# Patient Record
Sex: Male | Born: 1960 | Race: White | Hispanic: No | Marital: Married | Smoking: Heavy tobacco smoker
Health system: Southern US, Community
[De-identification: ages and names within clinical notes are randomized; demographics above are authoritative.]

## PROBLEM LIST (undated history)

## (undated) DIAGNOSIS — I739 Peripheral vascular disease, unspecified: Secondary | ICD-10-CM

## (undated) DIAGNOSIS — Z87442 Personal history of urinary calculi: Secondary | ICD-10-CM

## (undated) DIAGNOSIS — I2510 Atherosclerotic heart disease of native coronary artery without angina pectoris: Secondary | ICD-10-CM

## (undated) DIAGNOSIS — N189 Chronic kidney disease, unspecified: Secondary | ICD-10-CM

## (undated) DIAGNOSIS — K219 Gastro-esophageal reflux disease without esophagitis: Secondary | ICD-10-CM

## (undated) DIAGNOSIS — J189 Pneumonia, unspecified organism: Secondary | ICD-10-CM

## (undated) DIAGNOSIS — E7800 Pure hypercholesterolemia, unspecified: Secondary | ICD-10-CM

## (undated) DIAGNOSIS — J449 Chronic obstructive pulmonary disease, unspecified: Secondary | ICD-10-CM

## (undated) DIAGNOSIS — F32A Depression, unspecified: Secondary | ICD-10-CM

## (undated) DIAGNOSIS — I714 Abdominal aortic aneurysm, without rupture, unspecified: Secondary | ICD-10-CM

## (undated) DIAGNOSIS — I10 Essential (primary) hypertension: Secondary | ICD-10-CM

## (undated) DIAGNOSIS — I723 Aneurysm of iliac artery: Secondary | ICD-10-CM

## (undated) DIAGNOSIS — F419 Anxiety disorder, unspecified: Secondary | ICD-10-CM

## (undated) DIAGNOSIS — C801 Malignant (primary) neoplasm, unspecified: Secondary | ICD-10-CM

## (undated) HISTORY — DX: Peripheral vascular disease, unspecified: I73.9

## (undated) HISTORY — DX: Abdominal aortic aneurysm, without rupture, unspecified: I71.40

## (undated) HISTORY — PX: APPENDECTOMY: SHX54

## (undated) HISTORY — DX: Pure hypercholesterolemia, unspecified: E78.00

## (undated) HISTORY — PX: CATARACT EXTRACTION: SUR2

## (undated) HISTORY — DX: Atherosclerotic heart disease of native coronary artery without angina pectoris: I25.10

---
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
## 2007-12-13 ENCOUNTER — Ambulatory Visit (HOSPITAL_COMMUNITY): Admission: RE | Admit: 2007-12-13 | Discharge: 2007-12-13 | Payer: Self-pay | Admitting: Ophthalmology

## 2009-12-03 ENCOUNTER — Ambulatory Visit (HOSPITAL_COMMUNITY): Admission: RE | Admit: 2009-12-03 | Discharge: 2009-12-03 | Payer: Self-pay | Admitting: Urology

## 2011-02-11 LAB — BASIC METABOLIC PANEL
BUN: 11
Chloride: 105
GFR calc Af Amer: >60

## 2011-02-11 LAB — HEMOGLOBIN AND HEMATOCRIT, BLOOD: Hemoglobin: 14.3

## 2013-07-18 ENCOUNTER — Ambulatory Visit (INDEPENDENT_AMBULATORY_CARE_PROVIDER_SITE_OTHER): Payer: BC Managed Care – PPO | Admitting: Family Medicine

## 2013-07-18 ENCOUNTER — Encounter: Payer: Self-pay | Admitting: Family Medicine

## 2013-07-18 ENCOUNTER — Encounter (INDEPENDENT_AMBULATORY_CARE_PROVIDER_SITE_OTHER): Payer: Self-pay

## 2013-07-18 ENCOUNTER — Ambulatory Visit (INDEPENDENT_AMBULATORY_CARE_PROVIDER_SITE_OTHER): Payer: BC Managed Care – PPO

## 2013-07-18 VITALS — BP 118/80 | HR 84 | Temp 98.3°F | Ht 72.0 in | Wt 313.8 lb

## 2013-07-18 DIAGNOSIS — R0989 Other specified symptoms and signs involving the circulatory and respiratory systems: Secondary | ICD-10-CM

## 2013-07-18 DIAGNOSIS — J209 Acute bronchitis, unspecified: Secondary | ICD-10-CM

## 2013-07-18 MED ORDER — ALBUTEROL SULFATE HFA 108 (90 BASE) MCG/ACT IN AERS: 2.0000 | INHALATION_SPRAY | Freq: Four times a day (QID) | RESPIRATORY_TRACT | Status: DC | PRN

## 2013-07-18 MED ORDER — ALBUTEROL SULFATE (2.5 MG/3ML) 0.083% IN NEBU: 2.5000 mg | INHALATION_SOLUTION | Freq: Four times a day (QID) | RESPIRATORY_TRACT | Status: DC | PRN

## 2013-07-18 MED ORDER — LEVALBUTEROL HCL 1.25 MG/0.5ML IN NEBU: 1.2500 mg | INHALATION_SOLUTION | Freq: Once | RESPIRATORY_TRACT | Status: DC

## 2013-07-18 MED ORDER — LEVOFLOXACIN 500 MG PO TABS: 500.0000 mg | ORAL_TABLET | Freq: Every day | ORAL | Status: DC

## 2013-07-18 MED ORDER — HYDROCODONE-HOMATROPINE 5-1.5 MG/5ML PO SYRP: 5.0000 mL | ORAL_SOLUTION | Freq: Three times a day (TID) | ORAL | Status: DC | PRN

## 2013-07-18 MED ORDER — PREDNISONE 10 MG PO TABS: ORAL_TABLET | ORAL | Status: DC

## 2013-07-18 NOTE — Progress Notes (Signed)
   Subjective:    Patient ID: Resa Minerarl A Kingsford, male    DOB: [birth year], 53 y.o.   MRN: [id number]  HPI  This 53 y.o. male presents for evaluation of cough and wheezing.  He is SOB with exertion And he is a smoker.  He was told by the nurse at work he may have pneumonia.  Review of Systems C/o cough and URI sx's   No chest pain, SOB, HA, dizziness, vision change, N/V, diarrhea, constipation, dysuria, urinary urgency or frequency, myalgias, arthralgias or rash.  Objective:   Physical Exam  Vital signs noted  Well developed well nourished male.  HEENT - Head atraumatic Normocephalic                Eyes - PERRLA, Conjuctiva - clear Sclera- Clear EOMI                Ears - EAC's Wnl TM's Wnl Gross Hearing WNL                Nose - Nares patent                 Throat - oropharanx wnl Respiratory - Lungs with expiratory wheezes scattered Cardiac - RRR S1 and S2 without murmur GI - Abdomen soft Nontender and bowel sounds active x 4  Post Neb tx - Lungs with better air movement and expiratory wheezes.  CXR - No infiltrates Prelimnary reading by Angeline SlimWilliam Lelah Rennaker,FNP    Assessment & Plan:  Chest congestion - Plan: DG Chest 2 View, levofloxacin (LEVAQUIN) 500 MG tablet, predniSONE (DELTASONE) 10 MG tablet, HYDROcodone-homatropine (HYCODAN) 5-1.5 MG/5ML syrup, albuterol (PROVENTIL HFA;VENTOLIN HFA) 108 (90 BASE) MCG/ACT inhaler, levalbuterol (XOPENEX) nebulizer solution 1.25 mg  Acute bronchitis - Plan: levofloxacin (LEVAQUIN) 500 MG tablet, predniSONE (DELTASONE) 10 MG tablet, HYDROcodone-homatropine (HYCODAN) 5-1.5 MG/5ML syrup, albuterol (PROVENTIL HFA;VENTOLIN HFA) 108 (90 BASE) MCG/ACT inhaler, levalbuterol (XOPENEX) nebulizer solution 1.25 mg  Push po fluids, rest, tylenol and motrin otc prn as directed for fever, arthralgias, and myalgias.  Follow up prn if sx's continue or persist.  Deatra CanterWilliam J Cillian Gwinner FNP

## 2014-09-19 ENCOUNTER — Encounter: Payer: Self-pay | Admitting: Physician Assistant

## 2014-09-19 ENCOUNTER — Ambulatory Visit: Payer: Self-pay | Admitting: Family

## 2014-09-19 ENCOUNTER — Ambulatory Visit (INDEPENDENT_AMBULATORY_CARE_PROVIDER_SITE_OTHER): Payer: BLUE CROSS/BLUE SHIELD | Admitting: Physician Assistant

## 2014-09-19 ENCOUNTER — Ambulatory Visit (INDEPENDENT_AMBULATORY_CARE_PROVIDER_SITE_OTHER): Payer: BLUE CROSS/BLUE SHIELD

## 2014-09-19 VITALS — BP 125/83 | HR 75 | Temp 96.8°F | Ht 72.0 in | Wt 315.0 lb

## 2014-09-19 DIAGNOSIS — M25532 Pain in left wrist: Secondary | ICD-10-CM | POA: Diagnosis not present

## 2014-09-19 DIAGNOSIS — M79642 Pain in left hand: Secondary | ICD-10-CM | POA: Diagnosis not present

## 2014-09-19 MED ORDER — DICLOFENAC SODIUM 75 MG PO TBEC: 75.0000 mg | DELAYED_RELEASE_TABLET | Freq: Two times a day (BID) | ORAL | Status: DC

## 2014-09-19 NOTE — Progress Notes (Signed)
   Subjective:    Patient ID: Vincent Griffin, male    DOB: 1961-04-07, 54 y.o.   MRN: [id number]  HPI  54 y/o male presents with c/o left hand pain and swelling that started yesterday. He had pain in his left bicep/tricep area 3 weeks ago also. 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 Initial symptom of tingling but has resolved.     Review of Systems  Musculoskeletal: Positive for joint swelling and arthralgias.  Skin: Negative.   Neurological: Negative.        Objective:   Physical Exam  Constitutional: He is oriented to person, place, and time. He appears well-developed and well-nourished. No distress.  Obese    Cardiovascular: Normal rate, regular rhythm and normal heart sounds.   Pulmonary/Chest: Effort normal and breath sounds normal.  Musculoskeletal: He exhibits edema (left wrist and dorsal surface of hand) and tenderness.  Xray negative for fracture or dislocation  Decreased ROM d/t pain   Neurological: He is alert and oriented to person, place, and time.  Skin: No rash noted. He is not diaphoretic. No erythema.  Psychiatric: He has a normal mood and affect. His behavior is normal. Judgment and thought content normal.  Nursing note and vitals reviewed.         Assessment & Plan:  1. Pain in joint involving hand, left  - DG Hand Complete Left; Future - diclofenac (VOLTAREN) 75 MG EC tablet; Take 1 tablet (75 mg total) by mouth 2 (two) times daily.  Dispense: 60 tablet; Refill: 1  2. Left wrist pain due to overuse   - diclofenac (VOLTAREN) 75 MG EC tablet; Take 1 tablet (75 mg total) by mouth 2 (two) times daily.  Dispense: 60 tablet; Refill: 1  RTC 3 weeks   Skylen Danielsen A. Chauncey ReadingGann PA-C

## 2014-09-19 NOTE — Patient Instructions (Addendum)
Use wrist brace 24 hrs daily x 2 weeks.  Rest the hand , no repetitive motion     Intermetacarpal Sprain The intermetacarpal ligaments run between the knuckles at the base of the fingers. These ligaments are vulnerable to sprain and injury in which the ligament becomes overstretched or torn. Intermetacarpal sprains are classified into 3 categories. Grade 1 sprains cause pain, but the tendon is not lengthened. Grade 2 sprains include a lengthened ligament, due to the ligament being stretched or partially ruptured. With grade 2 sprains there is still function, although function may be decreased. Grade 3 sprains include a complete tear of the ligament, and the joint usually displays a loss of function.  SYMPTOMS   Severe pain at the time of injury.  Often, a feeling of popping or tearing inside the hand.  Tenderness and inflammation at the knuckles.  Bruising within a couple days of injury.  Impaired ability to use the hand. CAUSES  This condition occurs when the intermetacarpal ligaments are subjected to a greater stress than they can handle. This causes the ligaments to become stretched or torn. RISK INCREASES WITH:  Previous hand injury.  Fighting sports (boxing, wrestling, martial arts).  Sports in which you could fall on an outstretched hand (soccer, basketball, volleyball).  Other sports with repeated hand trauma (water polo, gymnastics).  Poor hand strength and flexibility.  Inadequate or poorly fitted protective equipment. PREVENTION   Warm up and stretch properly before activity.  Maintain appropriate conditioning:  Hand flexibility.  Muscle strength and endurance.  Applying tape, protective strapping, or a brace may help prevent injury.  Provide the hand with support during sports and practice activities for 6 to 12 months following injury. PROGNOSIS  With proper treatment, healing should occur without impairment. The length of healing varies from 2 to 12 weeks,  depending on the severity of injury. RELATED COMPLICATIONS   Longer healing time, if activities are resumed too soon.  Recurring symptoms or repeated injury, resulting in a chronic problem.  Injury to other nearby structures (bone, cartilage, tendon).  Arthritis of the knuckle (intermetacarpal) joint, with repeated sprains.  Prolonged disability (sometimes).  Hand and finger stiffness or weakness. TREATMENT Treatment first involves ice and medicine to reduce pain and inflammation. An elastic compression bandage may be worn to reduce discomfort and to protect the area. Depending on the severity of injury, you may be required to restrain the area with a cast, splint, or brace. After the ligament has been allowed to heal, strengthening and stretching exercises may be needed to regain strength and a full range of motion. Exercises may be completed at home or with a therapist. Surgery is rarely needed. MEDICATION   If pain medicine is needed, nonsteroidal anti-inflammatory medicines (aspirin and ibuprofen), or other minor pain relievers (acetaminophen), are often advised.  Do not take pain medicine for 7 days before surgery.  Stronger pain relievers may be prescribed if your caregiver thinks they are needed. Use only as directed and only as much as you need. HEAT AND COLD  Cold treatment (icing) should be applied for 10 to 15 minutes every 2 to 3 hours for inflammation and pain, and immediately after activity that aggravates your symptoms. Use ice packs or an ice massage.  Heat treatment may be used before performing stretching and strengthening activities prescribed by your caregiver, physical therapist, or athletic trainer. Use a heat pack or a warm water soak. SEEK MEDICAL CARE IF:   Symptoms remain or get worse, despite treatment  for longer than 2 to 4 weeks.  You experience pain, numbness, discoloration, or coldness in the hand or fingers.  You develop blue, gray, or dark  fingernails.  Any of the following occur after surgery: increased pain, swelling, redness, drainage of fluids, bleeding in the affected area, or signs of infection, including fever.  New, unexplained symptoms develop. (Drugs used in treatment may produce side effects.) Document Released: 05/02/2005 Document Revised: 09/16/2013 Document Reviewed: 08/14/2008 [facility] Patient Information 2015 [employer] [place]. This information is not intended to replace advice given to you by your health care provider. Make sure you discuss any questions you have with your health care provider.

## 2014-10-10 ENCOUNTER — Encounter: Payer: Self-pay | Admitting: Physician Assistant

## 2014-10-10 ENCOUNTER — Ambulatory Visit (INDEPENDENT_AMBULATORY_CARE_PROVIDER_SITE_OTHER): Payer: BLUE CROSS/BLUE SHIELD | Admitting: Physician Assistant

## 2014-10-10 VITALS — BP 125/85 | HR 89 | Temp 97.5°F | Ht 72.0 in | Wt 313.0 lb

## 2014-10-10 DIAGNOSIS — L03113 Cellulitis of right upper limb: Secondary | ICD-10-CM | POA: Diagnosis not present

## 2014-10-10 NOTE — Progress Notes (Signed)
   Subjective:    Patient ID: Vincent Griffin, male    DOB: [birth year], 54 y.o.   MRN: [id number]  HPI53 y/o male presents for follow up of left hand pain. He states that he wore a wrist brace x 4 days and has took the Voltaren daily with relief. He currently has no pain in his left hand.   He is currently having right shoulder pain, and continues to take the voltaren.   He has also been taking Doxycycline 100mg  BID x 10 days for cellulitis of his right forearm. He has finished the antibiotic and cellulitis has resolved. He continue to have a knot in the same area on his right elbow.     Review of Systems  Musculoskeletal:       Left hand pain has resolved New onset of right shoulder pain, but improving Knot on right elbow       Objective:   Physical Exam  Musculoskeletal:  Mass on right elbow, appears to be mild soft tissue inflammation remaining from previous cellultis of right arm  No remaining edema in left hand, full ROM  Full ROM of right shoulder          Assessment & Plan:  1. Cellulitis of right upper extremity  - Aerobic culture  2. Left hand pain resolved with Voltaren   RTO prn    Camran Keady A. Chauncey ReadingGann PA-C

## 2014-10-12 LAB — AEROBIC CULTURE

## 2014-10-14 ENCOUNTER — Other Ambulatory Visit: Payer: Self-pay | Admitting: Physician Assistant

## 2014-10-14 ENCOUNTER — Telehealth: Payer: Self-pay | Admitting: Physician Assistant

## 2014-10-14 DIAGNOSIS — B958 Unspecified staphylococcus as the cause of diseases classified elsewhere: Secondary | ICD-10-CM

## 2014-10-14 DIAGNOSIS — L089 Local infection of the skin and subcutaneous tissue, unspecified: Secondary | ICD-10-CM

## 2014-10-14 MED ORDER — SULFAMETHOXAZOLE-TRIMETHOPRIM 800-160 MG PO TABS: 1.0000 | ORAL_TABLET | Freq: Two times a day (BID) | ORAL | Status: DC

## 2014-10-14 NOTE — Telephone Encounter (Signed)
Please call for results

## 2015-02-17 ENCOUNTER — Encounter: Payer: Self-pay | Admitting: Family Medicine

## 2015-02-17 ENCOUNTER — Encounter: Payer: Self-pay | Admitting: *Deleted

## 2015-02-17 ENCOUNTER — Ambulatory Visit (INDEPENDENT_AMBULATORY_CARE_PROVIDER_SITE_OTHER): Payer: BLUE CROSS/BLUE SHIELD

## 2015-02-17 ENCOUNTER — Ambulatory Visit (INDEPENDENT_AMBULATORY_CARE_PROVIDER_SITE_OTHER): Payer: BLUE CROSS/BLUE SHIELD | Admitting: Family Medicine

## 2015-02-17 VITALS — BP 135/87 | HR 68 | Temp 97.2°F | Ht 72.0 in | Wt 313.0 lb

## 2015-02-17 DIAGNOSIS — M25522 Pain in left elbow: Secondary | ICD-10-CM | POA: Diagnosis not present

## 2015-02-17 DIAGNOSIS — L03114 Cellulitis of left upper limb: Secondary | ICD-10-CM

## 2015-02-17 LAB — CBC WITH DIFFERENTIAL/PLATELET
BASOS ABS: 0 10*3/uL (ref 0.0–0.1)
BASOS PCT: 0 % (ref 0–1)
EOS ABS: 0.3 10*3/uL (ref 0.0–0.7)
Eosinophils Relative: 3 % (ref 0–5)
HCT: 41.1 % (ref 39.0–52.0)
Hemoglobin: 14.1 g/dL (ref 13.0–17.0)
Lymphocytes Relative: 30 % (ref 12–46)
Lymphs Abs: 2.6 10*3/uL (ref 0.7–4.0)
MCH: 33 pg (ref 26.0–34.0)
MCHC: 34.3 g/dL (ref 30.0–36.0)
MCV: 96.3 fL (ref 78.0–100.0)
MPV: 10 fL (ref 8.6–12.4)
Monocytes Absolute: 0.7 10*3/uL (ref 0.1–1.0)
Monocytes Relative: 8 % (ref 3–12)
NEUTROS PCT: 59 % (ref 43–77)
Neutro Abs: 5.2 10*3/uL (ref 1.7–7.7)
PLATELETS: 234 10*3/uL (ref 150–400)
RBC: 4.27 MIL/uL (ref 4.22–5.81)
RDW: 14 % (ref 11.5–15.5)
WBC: 8.8 10*3/uL (ref 4.0–10.5)

## 2015-02-17 MED ORDER — SULFAMETHOXAZOLE-TRIMETHOPRIM 800-160 MG PO TABS: 1.0000 | ORAL_TABLET | Freq: Two times a day (BID) | ORAL | Status: DC

## 2015-02-17 MED ORDER — CEFTRIAXONE SODIUM 1 G IJ SOLR
1.0000 g | Freq: Once | INTRAMUSCULAR | Status: AC
  Administered 2015-02-17: 1 g via INTRAMUSCULAR

## 2015-02-17 NOTE — Progress Notes (Signed)
Subjective:    Patient ID: Vincent Griffin, male    DOB: 04/14/1961, 54 y.o.   MRN: [id number]  HPI Patient here today for left elbow pain. He has seen urgent care for this in the past, they diagnosed him with cellulitis. The last treatment for this was a couple weeks ago. The patient has swelling and redness with decreased movement of the left elbow.       There are no active problems to display for this patient.  Outpatient Encounter Prescriptions as of 02/17/2015  Medication Sig  . diclofenac (VOLTAREN) 75 MG EC tablet Take 1 tablet (75 mg total) by mouth 2 (two) times daily.  Marland Kitchen HYDROcodone-acetaminophen (NORCO/VICODIN) 5-325 MG tablet Take 1 tablet by mouth every 6 (six) hours as needed for moderate pain. FROM OUTSIDE SOURCE - patient reported  . [DISCONTINUED] albuterol (PROVENTIL HFA;VENTOLIN HFA) 108 (90 BASE) MCG/ACT inhaler Inhale 2 puffs into the lungs every 6 (six) hours as needed for wheezing or shortness of breath.  . [DISCONTINUED] albuterol (PROVENTIL) (2.5 MG/3ML) 0.083% nebulizer solution Take 3 mLs (2.5 mg total) by nebulization every 6 (six) hours as needed for wheezing or shortness of breath.  . [DISCONTINUED] glucosamine-chondroitin 500-400 MG tablet Take 1 tablet by mouth 3 (three) times daily.  . [DISCONTINUED] Multiple Vitamin (MULTIVITAMIN) tablet Take 1 tablet by mouth daily.  . [DISCONTINUED] sulfamethoxazole-trimethoprim (BACTRIM DS,SEPTRA DS) 800-160 MG per tablet Take 1 tablet by mouth 2 (two) times daily.   No facility-administered encounter medications on file as of 02/17/2015.      Review of Systems  Constitutional: Negative.   HENT: Negative.   Eyes: Negative.   Respiratory: Negative.   Cardiovascular: Negative.   Gastrointestinal: Negative.   Endocrine: Negative.   Genitourinary: Negative.   Musculoskeletal: Positive for arthralgias (left elbow pain).  Skin: Negative.   Allergic/Immunologic: Negative.   Neurological: Negative.   Hematological:  Negative.   Psychiatric/Behavioral: Negative.        Objective:   Physical Exam  Constitutional: He is oriented to person, place, and time. He appears well-developed and well-nourished. No distress.  HENT:  Head: Normocephalic.  Eyes: Conjunctivae and EOM are normal. Pupils are equal, round, and reactive to [place]. Right eye exhibits no discharge. Left eye exhibits no discharge. No scleral icterus.  Neck: Normal range of motion.  Musculoskeletal: He exhibits edema and tenderness.  There is warmth and swelling of the left elbow. The right elbow is cool the left elbow does have full extension but this extension is limited due to the amount of swelling.  Neurological: He is alert and oriented to person, place, and time.  Skin: Skin is warm. No rash noted. There is erythema.  Psychiatric: He has a normal mood and affect. His behavior is normal. Judgment and thought content normal.  Vitals reviewed.   BP 135/87 mmHg  Pulse 68  Temp(Src) 97.2 F (36.2 C) (Oral)  Ht 6' (1.829 m)  Wt 313 lb (141.976 kg)  BMI 42.44 kg/m2  [facility] reading (PRIMARY) by  Dr. Shawn Stall elbow no acute fracture just soft tissue swelling                                       Assessment & Plan:  1. Left elbow pain - DG Elbow 2 Views Left; Future - CBC with Differential/Platelet - sulfamethoxazole-trimethoprim (BACTRIM DS,SEPTRA DS) 800-160 MG tablet; Take 1 tablet by mouth 2 (two)  times daily.  Dispense: 60 tablet; Refill: 0  2. Cellulitis of left upper extremity - sulfamethoxazole-trimethoprim (BACTRIM DS,SEPTRA DS) 800-160 MG tablet; Take 1 tablet by mouth 2 (two) times daily.  Dispense: 60 tablet; Refill: 0 -1 g of Rocephin IM -Remain out of work for the next 3 days -Return to clinic in 1 week for recheck -Drop by the office tomorrow and if inflammation is persisting may consider giving another gram of Rocephin   Patient Instructions  Take antibiotic as directed Return to clinic in one week for  recheck If worse return to clinic sooner Take Tylenol for pain   Nyra Capes MD

## 2015-02-17 NOTE — Addendum Note (Signed)
Addended by: Magdalene River on: 02/17/2015 04:10 PM   Modules accepted: Orders

## 2015-02-17 NOTE — Patient Instructions (Signed)
Take antibiotic as directed Return to clinic in one week for recheck If worse return to clinic sooner Take Tylenol for pain

## 2015-02-18 ENCOUNTER — Ambulatory Visit: Payer: BLUE CROSS/BLUE SHIELD | Admitting: Family Medicine

## 2015-02-25 ENCOUNTER — Encounter: Payer: Self-pay | Admitting: Family Medicine

## 2015-02-25 ENCOUNTER — Encounter: Payer: Self-pay | Admitting: *Deleted

## 2015-02-25 ENCOUNTER — Ambulatory Visit (INDEPENDENT_AMBULATORY_CARE_PROVIDER_SITE_OTHER): Payer: BLUE CROSS/BLUE SHIELD | Admitting: Family Medicine

## 2015-02-25 VITALS — BP 124/87 | HR 84 | Temp 97.6°F | Ht 72.0 in | Wt 309.0 lb

## 2015-02-25 DIAGNOSIS — L03114 Cellulitis of left upper limb: Secondary | ICD-10-CM

## 2015-02-25 DIAGNOSIS — M25522 Pain in left elbow: Secondary | ICD-10-CM

## 2015-02-25 NOTE — Patient Instructions (Signed)
Continue the antibiotic for the full course of time and continue to take it with food The patient may restart his Voltaren and take it as sparingly as possible with food but at a different time from the sulfa. We will recheck the patient when he completes the antibiotic and assess the condition of the elbow at that time

## 2015-02-25 NOTE — Progress Notes (Signed)
   Subjective:    Patient ID: Vincent Griffin, male    DOB: 1960/07/28, 54 y.o.   MRN: [id number]  HPI Pt here for follow up on left elbow cellulitis. He is doing better, but is still in some pain. The patient is pleasant and relaxed and is having no problems taking the antibiotic for the infection.     There are no active problems to display for this patient.  Outpatient Encounter Prescriptions as of 02/25/2015  Medication Sig  . diclofenac (VOLTAREN) 75 MG EC tablet Take 1 tablet (75 mg total) by mouth 2 (two) times daily.  Marland Kitchen. HYDROcodone-acetaminophen (NORCO/VICODIN) 5-325 MG tablet Take 1 tablet by mouth every 6 (six) hours as needed for moderate pain. FROM OUTSIDE SOURCE - patient reported  . sulfamethoxazole-trimethoprim (BACTRIM DS,SEPTRA DS) 800-160 MG tablet Take 1 tablet by mouth 2 (two) times daily.   No facility-administered encounter medications on file as of 02/25/2015.     Review of Systems  Constitutional: Negative.   HENT: Negative.   Eyes: Negative.   Respiratory: Negative.   Cardiovascular: Negative.   Gastrointestinal: Negative.   Endocrine: Negative.   Genitourinary: Negative.   Musculoskeletal: Negative.   Skin: Negative.        Left elbow - pain and recent cellulitis.   Allergic/Immunologic: Negative.   Neurological: Negative.   Hematological: Negative.   Psychiatric/Behavioral: Negative.        Objective:   Physical Exam BP 124/87 mmHg  Pulse 84  Temp(Src) 97.6 F (36.4 C) (Oral)  Ht 6' (1.829 m)  Wt 309 lb (140.161 kg)  BMI 41.90 kg/m2  There is improvement with redness and erythema and pain though the pain is still present especially with bending the elbow a lot. He has been on the Septra regularly since the visit and was given a month's supply of this.      Assessment & Plan:  1. Left elbow pain -Improving with antibiotic  2. Cellulitis of left upper extremity -Improving with antibiotic. The patient will continue to take this antibiotic  for at least one month and we will recheck him at that time.  Patient Instructions  Continue the antibiotic for the full course of time and continue to take it with food The patient may restart his Voltaren and take it as sparingly as possible with food but at a different time from the sulfa. We will recheck the patient when he completes the antibiotic and assess the condition of the elbow at that time   Nyra Capeson W. Li Fragoso MD

## 2015-03-20 ENCOUNTER — Ambulatory Visit (INDEPENDENT_AMBULATORY_CARE_PROVIDER_SITE_OTHER): Payer: BLUE CROSS/BLUE SHIELD | Admitting: Family Medicine

## 2015-03-20 ENCOUNTER — Encounter: Payer: Self-pay | Admitting: Family Medicine

## 2015-03-20 VITALS — BP 114/80 | HR 70 | Temp 97.1°F | Ht 72.0 in | Wt 308.0 lb

## 2015-03-20 DIAGNOSIS — M25522 Pain in left elbow: Secondary | ICD-10-CM | POA: Diagnosis not present

## 2015-03-20 DIAGNOSIS — L03114 Cellulitis of left upper limb: Secondary | ICD-10-CM

## 2015-03-20 MED ORDER — SULFAMETHOXAZOLE-TRIMETHOPRIM 800-160 MG PO TABS: 1.0000 | ORAL_TABLET | Freq: Two times a day (BID) | ORAL | Status: DC

## 2015-03-20 NOTE — Progress Notes (Signed)
Subjective:    Patient ID: Vincent Griffin, male    DOB: 12-21-1960, 54 y.o.   MRN: [id number]  HPI Patient here today for a 3 week follow up on left elbow pain and swelling. He states it is better, but at times still slightly tender. She just finished the antibiotic yesterday. He did take a course of 4 weeks of this. He has not taken any NSAID.       There are no active problems to display for this patient.  Outpatient Encounter Prescriptions as of 03/20/2015  Medication Sig  . diclofenac (VOLTAREN) 75 MG EC tablet Take 1 tablet (75 mg total) by mouth 2 (two) times daily. (Patient not taking: Reported on 03/20/2015)  . HYDROcodone-acetaminophen (NORCO/VICODIN) 5-325 MG tablet Take 1 tablet by mouth every 6 (six) hours as needed for moderate pain. FROM OUTSIDE SOURCE - patient reported  . [DISCONTINUED] sulfamethoxazole-trimethoprim (BACTRIM DS,SEPTRA DS) 800-160 MG tablet Take 1 tablet by mouth 2 (two) times daily.   No facility-administered encounter medications on file as of 03/20/2015.     Review of Systems  Constitutional: Negative.   HENT: Negative.   Eyes: Negative.   Respiratory: Negative.   Cardiovascular: Negative.   Gastrointestinal: Negative.   Endocrine: Negative.   Genitourinary: Negative.   Musculoskeletal: Negative.   Skin: Negative.   Allergic/Immunologic: Negative.   Neurological: Negative.   Hematological: Negative.   Psychiatric/Behavioral: Negative.        Objective:   Physical Exam  Constitutional: He is oriented to person, place, and time. He appears well-developed and well-nourished. No distress.  Pleasant and alert  HENT:  Head: Normocephalic.  Eyes: Conjunctivae and EOM are normal. Pupils are equal, round, and reactive to [place]. Right eye exhibits no discharge. Left eye exhibits no discharge.  Neck: Normal range of motion. Neck supple.  Musculoskeletal: Normal range of motion. He exhibits no edema or tenderness.  Neurological: He is alert and  oriented to person, place, and time.  Skin: Skin is warm and dry. No rash noted. No erythema.  No erythema about the elbow or rubor. No swelling.  Psychiatric: He has a normal mood and affect. His behavior is normal. Thought content normal.  Nursing note and vitals reviewed.  BP 114/80 mmHg  Pulse 70  Temp(Src) 97.1 F (36.2 C) (Oral)  Ht 6' (1.829 m)  Wt 308 lb (139.708 kg)  BMI 41.76 kg/m2        Assessment & Plan:  1. Left elbow pain -This is much improved and the patient says it is only slightly tender at one spot. There is actually no redness or rubor and the patient has full extension of his elbow and flexion - sulfamethoxazole-trimethoprim (BACTRIM DS,SEPTRA DS) 800-160 MG tablet; Take 1 tablet by mouth 2 (two) times daily.  Dispense: 60 tablet; Refill: 0  2. Cellulitis of left upper extremity -Much better and no erythema or swelling. The patient will continue with the sulfa for 2 more weeks and will return to see [place] in 6 weeks but will call back sooner if any recurrence occurs - sulfamethoxazole-trimethoprim (BACTRIM DS,SEPTRA DS) 800-160 MG tablet; Take 1 tablet by mouth 2 (two) times daily.  Dispense: 60 tablet; Refill: 0  Patient Instructions  Take septra for 2 more weeks Follow up in 6 weeks Try to protect the elbow as much as possible from any trauma Keep a moisturizing lotion on it to keep the skin from cracking and being too dry If there is any recurrence of redness  or inflammation the patient should call back and be seen as quickly as possible otherwise we will see him in 6 weeks as planned   Nyra Capeson W. Moore MD

## 2015-03-20 NOTE — Patient Instructions (Addendum)
Take septra for 2 more weeks Follow up in 6 weeks Try to protect the elbow as much as possible from any trauma Keep a moisturizing lotion on it to keep the skin from cracking and being too dry If there is any recurrence of redness or inflammation the patient should call back and be seen as quickly as possible otherwise we will see him in 6 weeks as planned

## 2015-05-01 ENCOUNTER — Ambulatory Visit: Payer: BLUE CROSS/BLUE SHIELD | Admitting: Family Medicine

## 2015-05-04 ENCOUNTER — Encounter: Payer: Self-pay | Admitting: Family Medicine

## 2015-10-15 DIAGNOSIS — H04123 Dry eye syndrome of bilateral lacrimal glands: Secondary | ICD-10-CM | POA: Diagnosis not present

## 2015-10-15 DIAGNOSIS — H40033 Anatomical narrow angle, bilateral: Secondary | ICD-10-CM | POA: Diagnosis not present

## 2015-10-22 DIAGNOSIS — H268 Other specified cataract: Secondary | ICD-10-CM | POA: Diagnosis not present

## 2015-12-21 DIAGNOSIS — Z961 Presence of intraocular lens: Secondary | ICD-10-CM | POA: Diagnosis not present

## 2015-12-21 DIAGNOSIS — H2522 Age-related cataract, morgagnian type, left eye: Secondary | ICD-10-CM | POA: Diagnosis not present

## 2015-12-25 DIAGNOSIS — H2522 Age-related cataract, morgagnian type, left eye: Secondary | ICD-10-CM | POA: Diagnosis not present

## 2015-12-31 NOTE — Patient Instructions (Signed)
Your procedure is scheduled on:  01/07/2016               Report to [facility] at 7:10    AM.  Call this number if you have problems the morning of surgery: [phone]   Remember:   Do not eat or drink :After Midnight.    Take these medicines the morning of surgery with A SIP OF WATER:    none        Do not wear jewelry, make-up or nail polish.  Do not wear lotions, powders, or perfumes. You may wear deodorant.  Do not bring valuables to the hospital.  Contacts, dentures or bridgework may not be worn into surgery.  Patients discharged the day of surgery will not be allowed to drive home.  Name and phone number of your driver:    [email or web address] @ Cataract Surgery  A cataract is a clouding of the lens of the eye. When a lens becomes cloudy, vision is reduced based on the degree and nature of the clouding. Surgery may be needed to improve vision. Surgery removes the cloudy lens and usually replaces it with a substitute lens (intraocular lens, IOL). LET YOUR EYE DOCTOR KNOW ABOUT:  Allergies to food or medicine.   Medicines taken including herbs, eyedrops, over-the-counter medicines, and creams.   Use of steroids (by mouth or creams).   Previous problems with anesthetics or numbing medicine.   History of bleeding problems or blood clots.   Previous surgery.   Other health problems, including diabetes and kidney problems.   Possibility of pregnancy, if this applies.  RISKS AND COMPLICATIONS  Infection.   Inflammation of the eyeball (endophthalmitis) that can spread to both eyes (sympathetic ophthalmia).   Poor wound healing.   If an IOL is inserted, it can later fall out of proper position. This is very uncommon.   Clouding of the part of your eye that holds an IOL in place. This is called an "after-cataract." These are uncommon, but easily treated.  BEFORE THE PROCEDURE  Do not eat or drink anything except small amounts of water for 8 to 12 before your surgery, or  as directed by your caregiver.   Unless you are told otherwise, continue any eyedrops you have been prescribed.   Talk to your primary caregiver about all other medicines that you take (both prescription and non-prescription). In some cases, you may need to stop or change medicines near the time of your surgery. This is most important if you are taking blood-thinning medicine.Do not stop medicines unless you are told to do so.   Arrange for someone to drive you to and from the procedure.   Do not put contact lenses in either eye on the day of your surgery.  PROCEDURE There is more than one method for safely removing a cataract. Your doctor can explain the differences and help determine which is best for you. Phacoemulsification surgery is the most common form of cataract surgery.  An injection is given behind the eye or eyedrops are given to make this a painless procedure.   A small cut (incision) is made on the edge of the clear, dome-shaped surface that covers the front of the eye (cornea).   A tiny probe is painlessly inserted into the eye. This device gives off ultrasound waves that soften and break up the cloudy center of the lens. This makes it easier for the cloudy lens to be removed by suction.   An IOL may be implanted.  The normal lens of the eye is covered by a clear capsule. Part of that capsule is intentionally left in the eye to support the IOL.   Your surgeon may or may not use stitches to close the incision.  There are other forms of cataract surgery that require a larger incision and stiches to close the eye. This approach is taken in cases where the doctor feels that the cataract cannot be easily removed using phacoemulsification. AFTER THE PROCEDURE  When an IOL is implanted, it does not need care. It becomes a permanent part of your eye and cannot be seen or felt.   Your doctor will schedule follow-up exams to check on your progress.   Review your other medicines  with your doctor to see which can be resumed after surgery.   Use eyedrops or take medicine as prescribed by your doctor.  Document Released: 04/21/2011 Document Reviewed: 04/18/2011 [facility] Patient Information 2012 [place].  .Cataract Surgery Care After Refer to this sheet in the next few weeks. These instructions provide you with information on caring for yourself after your procedure. Your caregiver may also give you more specific instructions. Your treatment has been planned according to current medical practices, but problems sometimes occur. Call your caregiver if you have any problems or questions after your procedure.  HOME CARE INSTRUCTIONS   Avoid strenuous activities as directed by your caregiver.   Ask your caregiver when you can resume driving.   Use eyedrops or other medicines to help healing and control pressure inside your eye as directed by your caregiver.   Only take over-the-counter or prescription medicines for pain, discomfort, or fever as directed by your caregiver.   Do not to touch or rub your eyes.   You may be instructed to use a protective shield during the first few days and nights after surgery. If not, wear sunglasses to protect your eyes. This is to protect the eye from pressure or from being accidentally bumped.   Keep the area around your eye clean and dry. Avoid swimming or allowing water to hit you directly in the face while showering. Keep soap and shampoo out of your eyes.   Do not bend or lift heavy objects. Bending increases pressure in the eye. You can walk, climb stairs, and do [place] household chores.   Do not put a contact lens into the eye that had surgery until your caregiver says it is okay to do so.   Ask your doctor when you can return to work. This will depend on the kind of work that you do. If you work in a dusty environment, you may be advised to wear protective eyewear for a period of time.   Ask your caregiver when it will  be safe to engage in sexual activity.   Continue with your regular eye exams as directed by your caregiver.  What to expect:  It is normal to feel itching and mild discomfort for a few days after cataract surgery. Some fluid discharge is also common, and your eye may be sensitive to [place] and touch.   After 1 to 2 days, even moderate discomfort should disappear. In most cases, healing will take about 6 weeks.   If you received an intraocular lens (IOL), you may notice that colors are very bright or have a blue tinge. Also, if you have been in bright sunlight, everything may appear reddish for a few hours. If you see these color tinges, it is because your lens is  clear and no longer cloudy. Within a few months after receiving an IOL, these extra colors should go away. When you have healed, you will probably need new glasses.  SEEK MEDICAL CARE IF:   You have increased bruising around your eye.   You have discomfort not helped by medicine.  SEEK IMMEDIATE MEDICAL CARE IF:   You have a fever.   You have a worsening or sudden vision loss.   You have redness, swelling, or increasing pain in the eye.   You have a thick discharge from the eye that had surgery.  MAKE SURE YOU:  Understand these instructions.   Will watch your condition.   Will get help right away if you are not doing well or get worse.  Document Released: 11/19/2004 Document Revised: 04/21/2011 Document Reviewed: 12/24/2010 [facility] Patient Information 2012 [place].    Monitored Anesthesia Care  Monitored anesthesia care is an anesthesia service for a medical procedure. Anesthesia is the loss of the ability to feel pain. It is produced by medications called anesthetics. It may affect a small area of your body (local anesthesia), a large area of your body (regional anesthesia), or your entire body (general anesthesia). The need for monitored anesthesia care depends your procedure, your condition, and the potential  need for regional or general anesthesia. It is often provided during procedures where:   General anesthesia may be needed if there are complications. This is because you need special care when you are under general anesthesia.   You will be under local or regional anesthesia. This is so that you are able to have higher levels of anesthesia if needed.   You will receive calming medications (sedatives). This is especially the case if sedatives are given to put you in a semi-conscious state of relaxation (deep sedation). This is because the amount of sedative needed to produce this state can be hard to predict. Too much of a sedative can produce general anesthesia. Monitored anesthesia care is performed by one or more caregivers who have special training in all types of anesthesia. You will need to meet with these caregivers before your procedure. During this meeting, they will ask you about your medical history. They will also give you instructions to follow. (For example, you will need to stop eating and drinking before your procedure. You may also need to stop or change medications you are taking.) During your procedure, your caregivers will stay with you. They will:   Watch your condition. This includes watching you blood pressure, breathing, and level of pain.   Diagnose and treat problems that occur.   Give medications if they are needed. These may include calming medications (sedatives) and anesthetics.   Make sure you are comfortable.  Having monitored anesthesia care does not necessarily mean that you will be under anesthesia. It does mean that your caregivers will be able to manage anesthesia if you need it or if it occurs. It also means that you will be able to have a different type of anesthesia than you are having if you need it. When your procedure is complete, your caregivers will continue to watch your condition. They will make sure any medications wear off before you are allowed  to go home.  Document Released: 01/26/2005 Document Revised: 08/27/2012 Document Reviewed: 06/13/2012 [facility] Patient Information 2014 [place], [place].

## 2016-01-01 ENCOUNTER — Encounter (HOSPITAL_COMMUNITY): Payer: Self-pay

## 2016-01-01 ENCOUNTER — Encounter (HOSPITAL_COMMUNITY)
Admission: RE | Admit: 2016-01-01 | Discharge: 2016-01-01 | Disposition: A | Discharge status: Home / Self Care | Payer: BLUE CROSS/BLUE SHIELD | Source: Ambulatory Visit | Attending: Ophthalmology | Admitting: Ophthalmology

## 2016-01-01 ENCOUNTER — Other Ambulatory Visit: Payer: Self-pay

## 2016-01-01 DIAGNOSIS — Z01810 Encounter for preprocedural cardiovascular examination: Secondary | ICD-10-CM | POA: Insufficient documentation

## 2016-01-01 DIAGNOSIS — Z01812 Encounter for preprocedural laboratory examination: Secondary | ICD-10-CM | POA: Insufficient documentation

## 2016-01-01 HISTORY — DX: Chronic kidney disease, unspecified: N18.9

## 2016-01-06 ENCOUNTER — Encounter (HOSPITAL_COMMUNITY): Payer: Self-pay | Admitting: Anesthesiology

## 2016-01-06 NOTE — Anesthesia Preprocedure Evaluation (Addendum)
Anesthesia Evaluation  Patient identified by MRN, date of birth, ID band Patient awake    Reviewed: Allergy & Precautions, NPO status , Patient's Chart, lab work & pertinent test results  Airway Mallampati: III  TM Distance: >3 FB Neck ROM: Full    Dental no notable dental hx. (+) Teeth Intact   Pulmonary Current Smoker,    Pulmonary exam normal breath sounds clear to auscultation       Cardiovascular negative cardio ROS Normal cardiovascular exam Rhythm:Regular Rate:Normal     Neuro/Psych Cataract OS negative psych ROS   GI/Hepatic negative GI ROS, Neg liver ROS,   Endo/Other  Morbid obesity  Renal/GU Renal InsufficiencyRenal diseaseCKD  negative genitourinary   Musculoskeletal negative musculoskeletal ROS (+)   Abdominal (+) + obese,   Peds  Hematology negative hematology ROS (+)   Anesthesia Other Findings   Reproductive/Obstetrics                             Chemistry      Component Value Date/Time   NA 139 12/06/2007 1605   K 4.5 12/06/2007 1605   CL 105 12/06/2007 1605   CO2 30 12/06/2007 1605   BUN 11 12/06/2007 1605   CREATININE 1.15 12/06/2007 1605      Component Value Date/Time   CALCIUM 9.3 12/06/2007 1605     Lab Results  Component Value Date   WBC 8.8 02/17/2015   HGB 14.1 02/17/2015   HCT 41.1 02/17/2015   MCV 96.3 02/17/2015   PLT 234 02/17/2015   EKG: normal EKG, normal sinus rhythm.  Anesthesia Physical Anesthesia Plan  ASA: III  Anesthesia Plan: MAC   Post-op Pain Management:    Induction:   Airway Management Planned: Natural Airway and Nasal Cannula  Additional Equipment:   Intra-op Plan:   Post-operative Plan:   Informed Consent: I have reviewed the patients History and Physical, chart, labs and discussed the procedure including the risks, benefits and alternatives for the proposed anesthesia with the patient or authorized representative  who has indicated his/her understanding and acceptance.     Plan Discussed with: Anesthesiologist, CRNA and Surgeon  Anesthesia Plan Comments:        Anesthesia Quick Evaluation

## 2016-01-06 NOTE — OR Nursing (Signed)
Ok to use labs from 02/2015 per Dr. Malen GauzeFoster

## 2016-01-07 ENCOUNTER — Ambulatory Visit (HOSPITAL_COMMUNITY)
Admission: RE | Admit: 2016-01-07 | Discharge: 2016-01-07 | Disposition: A | Discharge status: Home / Self Care | Payer: BLUE CROSS/BLUE SHIELD | Source: Ambulatory Visit | Attending: Ophthalmology | Admitting: Ophthalmology

## 2016-01-07 ENCOUNTER — Ambulatory Visit (HOSPITAL_COMMUNITY): Payer: BLUE CROSS/BLUE SHIELD | Admitting: Anesthesiology

## 2016-01-07 ENCOUNTER — Encounter (HOSPITAL_COMMUNITY): Payer: Self-pay | Admitting: *Deleted

## 2016-01-07 ENCOUNTER — Encounter (HOSPITAL_COMMUNITY)
Admission: RE | Disposition: A | Discharge status: Home / Self Care | Payer: Self-pay | Source: Ambulatory Visit | Attending: Ophthalmology

## 2016-01-07 DIAGNOSIS — N189 Chronic kidney disease, unspecified: Secondary | ICD-10-CM | POA: Diagnosis not present

## 2016-01-07 DIAGNOSIS — H2522 Age-related cataract, morgagnian type, left eye: Secondary | ICD-10-CM | POA: Insufficient documentation

## 2016-01-07 DIAGNOSIS — H259 Unspecified age-related cataract: Secondary | ICD-10-CM | POA: Diagnosis not present

## 2016-01-07 DIAGNOSIS — F17200 Nicotine dependence, unspecified, uncomplicated: Secondary | ICD-10-CM | POA: Diagnosis not present

## 2016-01-07 DIAGNOSIS — Z6841 Body Mass Index (BMI) 40.0 and over, adult: Secondary | ICD-10-CM | POA: Diagnosis not present

## 2016-01-07 DIAGNOSIS — Z961 Presence of intraocular lens: Secondary | ICD-10-CM | POA: Insufficient documentation

## 2016-01-07 HISTORY — PX: CATARACT EXTRACTION W/PHACO: SHX586

## 2016-01-07 SURGERY — PHACOEMULSIFICATION, CATARACT, WITH IOL INSERTION
Anesthesia: Monitor Anesthesia Care | Site: Eye | Laterality: Left

## 2016-01-07 MED ORDER — EPINEPHRINE HCL 1 MG/ML IJ SOLN
INTRAMUSCULAR | Status: AC
  Filled 2016-01-07: qty 1

## 2016-01-07 MED ORDER — NEOMYCIN-POLYMYXIN-DEXAMETH 3.5-10000-0.1 OP SUSP
OPHTHALMIC | Status: DC | PRN
  Administered 2016-01-07: 2 [drp] via OPHTHALMIC

## 2016-01-07 MED ORDER — FENTANYL CITRATE (PF) 100 MCG/2ML IJ SOLN
INTRAMUSCULAR | Status: AC
  Filled 2016-01-07: qty 2

## 2016-01-07 MED ORDER — BSS IO SOLN
INTRAOCULAR | Status: DC | PRN
  Administered 2016-01-07: 15 mL

## 2016-01-07 MED ORDER — PROVISC 10 MG/ML IO SOLN
INTRAOCULAR | Status: DC | PRN
  Administered 2016-01-07: 0.85 mL via INTRAOCULAR

## 2016-01-07 MED ORDER — PHENYLEPHRINE HCL 2.5 % OP SOLN
1.0000 [drp] | OPHTHALMIC | Status: AC
  Administered 2016-01-07 (×3): 1 [drp] via OPHTHALMIC

## 2016-01-07 MED ORDER — MIDAZOLAM HCL 2 MG/2ML IJ SOLN
INTRAMUSCULAR | Status: AC
  Filled 2016-01-07: qty 2

## 2016-01-07 MED ORDER — LIDOCAINE HCL 3.5 % OP GEL
1.0000 "application " | Freq: Once | OPHTHALMIC | Status: AC
  Administered 2016-01-07: 1 via OPHTHALMIC

## 2016-01-07 MED ORDER — POVIDONE-IODINE 5 % OP SOLN
OPHTHALMIC | Status: DC | PRN
  Administered 2016-01-07: 1 via OPHTHALMIC

## 2016-01-07 MED ORDER — LACTATED RINGERS IV SOLN
Freq: Once | INTRAVENOUS | Status: AC
  Administered 2016-01-07: 1000 mL via INTRAVENOUS

## 2016-01-07 MED ORDER — CYCLOPENTOLATE-PHENYLEPHRINE 0.2-1 % OP SOLN
1.0000 [drp] | OPHTHALMIC | Status: AC
  Administered 2016-01-07 (×3): 1 [drp] via OPHTHALMIC

## 2016-01-07 MED ORDER — FENTANYL CITRATE (PF) 100 MCG/2ML IJ SOLN
25.0000 ug | INTRAMUSCULAR | Status: AC
  Administered 2016-01-07 (×2): 25 ug via INTRAVENOUS

## 2016-01-07 MED ORDER — TRYPAN BLUE 0.06 % OP SOLN
OPHTHALMIC | Status: DC | PRN
  Administered 2016-01-07: 0.5 mL via INTRAOCULAR

## 2016-01-07 MED ORDER — MIDAZOLAM HCL 2 MG/2ML IJ SOLN
1.0000 mg | INTRAMUSCULAR | Status: AC
  Administered 2016-01-07: 2 mg via INTRAVENOUS

## 2016-01-07 MED ORDER — LIDOCAINE HCL (PF) 1 % IJ SOLN
INTRAMUSCULAR | Status: DC | PRN
  Administered 2016-01-07: .5 mL

## 2016-01-07 MED ORDER — EPINEPHRINE HCL 1 MG/ML IJ SOLN
INTRAMUSCULAR | Status: DC | PRN
  Administered 2016-01-07: 500 mL

## 2016-01-07 MED ORDER — LACTATED RINGERS IV SOLN
INTRAVENOUS | Status: DC | PRN
  Administered 2016-01-07: 08:00:00 via INTRAVENOUS

## 2016-01-07 MED ORDER — TETRACAINE HCL 0.5 % OP SOLN
1.0000 [drp] | OPHTHALMIC | Status: AC
  Administered 2016-01-07 (×3): 1 [drp] via OPHTHALMIC

## 2016-01-07 MED ORDER — TRYPAN BLUE 0.06 % OP SOLN
OPHTHALMIC | Status: AC
  Filled 2016-01-07: qty 0.5

## 2016-01-07 SURGICAL SUPPLY — 10 items
CLOTH BEACON ORANGE TIMEOUT ST (SAFETY) ×2 IMPLANT
EYE SHIELD UNIVERSAL CLEAR (GAUZE/BANDAGES/DRESSINGS) ×2 IMPLANT
GLOVE BIOGEL PI IND STRL 7.0 (GLOVE) ×1 IMPLANT
GLOVE BIOGEL PI INDICATOR 7.0 (GLOVE) ×1
PAD ARMBOARD 7.5X6 YLW CONV (MISCELLANEOUS) ×2 IMPLANT
SIGHTPATH CAT PROC W REG LENS (Ophthalmic Related) ×2 IMPLANT
SYRINGE LUER LOK 1CC (MISCELLANEOUS) ×2 IMPLANT
TAPE SURG TRANSPORE 1 IN (GAUZE/BANDAGES/DRESSINGS) ×1 IMPLANT
TAPE SURGICAL TRANSPORE 1 IN (GAUZE/BANDAGES/DRESSINGS) ×1
WATER STERILE IRR 250ML POUR (IV SOLUTION) ×2 IMPLANT

## 2016-01-07 NOTE — H&P (Signed)
I have reviewed the H&P, the patient was re-examined, and I have identified no interval changes in medical condition and plan of care since the history and physical of record  

## 2016-01-07 NOTE — Anesthesia Postprocedure Evaluation (Signed)
Anesthesia Post Note  Patient: Vincent Griffin  Procedure(s) Performed: Procedure(s) (LRB): CATARACT EXTRACTION PHACO AND INTRAOCULAR LENS PLACEMENT LEFT EYE CDE=13.32 (Left)  Patient location during evaluation: PACU Anesthesia Type: MAC Level of consciousness: awake and alert and oriented Pain management: pain level controlled Vital Signs Assessment: post-procedure vital signs reviewed and stable Respiratory status: spontaneous breathing, nonlabored ventilation and respiratory function stable Cardiovascular status: stable and blood pressure returned to baseline Postop Assessment: no signs of nausea or vomiting Anesthetic complications: no    Last Vitals:  Vitals:   01/07/16 0845 01/07/16 0919  BP: 120/75 114/75  Pulse:  75  Resp: 11 16  Temp:  36.7 C    Last Pain:  Vitals:   01/07/16 0919  TempSrc: Oral                 Marie Chow A.

## 2016-01-07 NOTE — Discharge Instructions (Signed)

## 2016-01-07 NOTE — Anesthesia Postprocedure Evaluation (Signed)
Anesthesia Post Note  Patient: Vincent Griffin  Procedure(s) Performed: Procedure(s) (LRB): CATARACT EXTRACTION PHACO AND INTRAOCULAR LENS PLACEMENT LEFT EYE CDE=13.32 (Left)  Patient location during evaluation: Short Stay Anesthesia Type: MAC Level of consciousness: awake and alert Pain management: pain level controlled Vital Signs Assessment: post-procedure vital signs reviewed and stable Respiratory status: spontaneous breathing Cardiovascular status: blood pressure returned to baseline Postop Assessment: no signs of nausea or vomiting Anesthetic complications: no    Last Vitals:  Vitals:   01/07/16 0840 01/07/16 0845  BP: 109/72 120/75  Pulse:    Resp: 15 11  Temp:      Last Pain:  Vitals:   01/07/16 0757  TempSrc: Oral                 Rozanna Cormany

## 2016-01-07 NOTE — Op Note (Signed)
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  Assistants: None  Anesthesia: Topical with MAC  Indications: Painless, progressive loss of vision with compromise of daily activities.  Surgery: Cataract Extraction with Intraocular lens Implant Left Eye  Discription: The patient had dilating drops and viscous lidocaine placed into the Left eye in the pre-op holding area. After transfer to the operating room, a time out was performed. The patient was then prepped and draped. Beginning with a 75 degree blade a paracentesis port was made at the surgeon's 2 o'clock position. The anterior chamber was then filled with 1% non-preserved lidocaine. The anterior chamber was then filled with Vision Blue to stain the anterior capsule. The Vision Blue was displaced from the anterior chamber with BSS. This was followed by filling the anterior chamber with Viscoat. A 2.374mm keratome blade was used to make a clear corneal incision at the temporal limbus. A bent cystatome needle was used to create a continuous tear capsulotomy. Hydrodissection was performed with balanced salt solution on a Fine canula. The lens nucleus was then removed using the phacoemulsification handpiece. Residual cortex was removed with the I&A handpiece. The anterior chamber and capsular bag were refilled with Provisc. A posterior chamber intraocular lens was placed into the capsular bag with it's injector. The implant was positioned with the Kuglan hook. The Provisc was then removed from the anterior chamber and capsular bag with the I&A handpiece. Stromal hydration of the main incision and paracentesis port was performed with BSS on a Fine canula. The wounds were tested for leak which was negative. The patient tolerated the procedure well. There were no operative complications. The patient was then transferred to  the recovery room in stable condition.  Complications: None  Specimen: None  EBL: None  Prosthetic device: B&L enVista, MX60, power 21.5D, SN [id number].

## 2016-01-07 NOTE — Addendum Note (Signed)
Addendum  created 01/07/16 [id number] by Mal AmabileMichael Meloney Feld, MD   Sign clinical note

## 2016-01-07 NOTE — Transfer of Care (Signed)
Immediate Anesthesia Transfer of Care Note  Patient: Vincent Griffin  Procedure(s) Performed: Procedure(s): CATARACT EXTRACTION PHACO AND INTRAOCULAR LENS PLACEMENT LEFT EYE CDE=13.32 (Left)  Patient Location: Short Stay  Anesthesia Type:MAC  Level of Consciousness: awake, alert  and oriented  Airway & Oxygen Therapy: Patient Spontanous Breathing and Patient connected to nasal cannula oxygen  Post-op Assessment: Report given to RN  Post vital signs: Reviewed and stable  Last Vitals:  Vitals:   01/07/16 0840 01/07/16 0845  BP: 109/72 120/75  Pulse:    Resp: 15 11  Temp:      Last Pain:  Vitals:   01/07/16 0757  TempSrc: Oral      Patients Stated Pain Goal: 8 (01/07/16 0757)  Complications: No apparent anesthesia complications

## 2016-01-12 ENCOUNTER — Encounter (HOSPITAL_COMMUNITY): Payer: Self-pay | Admitting: Ophthalmology

## 2016-01-12 ENCOUNTER — Telehealth: Payer: Self-pay | Admitting: Family

## 2016-03-24 DIAGNOSIS — M25561 Pain in right knee: Secondary | ICD-10-CM | POA: Diagnosis not present

## 2016-03-24 DIAGNOSIS — S4991XA Unspecified injury of right shoulder and upper arm, initial encounter: Secondary | ICD-10-CM | POA: Diagnosis not present

## 2016-03-24 DIAGNOSIS — F17200 Nicotine dependence, unspecified, uncomplicated: Secondary | ICD-10-CM | POA: Diagnosis not present

## 2016-03-24 DIAGNOSIS — M546 Pain in thoracic spine: Secondary | ICD-10-CM | POA: Diagnosis not present

## 2016-03-24 DIAGNOSIS — S40011A Contusion of right shoulder, initial encounter: Secondary | ICD-10-CM | POA: Diagnosis not present

## 2016-03-24 DIAGNOSIS — T148XXA Other injury of unspecified body region, initial encounter: Secondary | ICD-10-CM | POA: Diagnosis not present

## 2016-03-24 DIAGNOSIS — M542 Cervicalgia: Secondary | ICD-10-CM | POA: Diagnosis not present

## 2016-03-24 DIAGNOSIS — S39012A Strain of muscle, fascia and tendon of lower back, initial encounter: Secondary | ICD-10-CM | POA: Diagnosis not present

## 2016-03-24 DIAGNOSIS — M545 Low back pain: Secondary | ICD-10-CM | POA: Diagnosis not present

## 2016-03-24 DIAGNOSIS — R079 Chest pain, unspecified: Secondary | ICD-10-CM | POA: Diagnosis not present

## 2016-03-24 DIAGNOSIS — S8001XA Contusion of right knee, initial encounter: Secondary | ICD-10-CM | POA: Diagnosis not present

## 2016-03-24 DIAGNOSIS — S134XXA Sprain of ligaments of cervical spine, initial encounter: Secondary | ICD-10-CM | POA: Diagnosis not present

## 2016-07-19 ENCOUNTER — Encounter: Payer: Self-pay | Admitting: Family

## 2016-07-19 ENCOUNTER — Ambulatory Visit (INDEPENDENT_AMBULATORY_CARE_PROVIDER_SITE_OTHER): Payer: BLUE CROSS/BLUE SHIELD | Admitting: Family

## 2016-07-19 VITALS — BP 125/89 | HR 78 | Temp 97.2°F | Ht 72.0 in | Wt 304.6 lb

## 2016-07-19 DIAGNOSIS — Z6841 Body Mass Index (BMI) 40.0 and over, adult: Secondary | ICD-10-CM

## 2016-07-19 DIAGNOSIS — Z720 Tobacco use: Secondary | ICD-10-CM | POA: Insufficient documentation

## 2016-07-19 DIAGNOSIS — F17200 Nicotine dependence, unspecified, uncomplicated: Secondary | ICD-10-CM | POA: Diagnosis not present

## 2016-07-19 DIAGNOSIS — H01001 Unspecified blepharitis right upper eyelid: Secondary | ICD-10-CM | POA: Diagnosis not present

## 2016-07-19 MED ORDER — BACITRACIN 500 UNIT/GM OP OINT
1.0000 | TOPICAL_OINTMENT | Freq: Three times a day (TID) | OPHTHALMIC | 0 refills | Status: DC
Start: 2016-07-19 — End: 2017-11-15

## 2016-07-19 NOTE — Patient Instructions (Signed)
Blepharitis Blepharitis means swollen eyelids. Follow these instructions at home: Pay attention to any changes in how you look or feel. Follow these instructions to help with your condition: Keeping Clean   Wash your hands often.  Wash your eyelids with warm water, or wash them with warm water that is mixed with little bit of baby shampoo. Do this 2 or more times per day.  Wash your face and eyebrows at least once a day.  Use a clean towel each time you dry your eyelids. Do not use the towel to clean or dry other areas of your body. Do not share your towel with anyone. General instructions   Avoid wearing makeup until you get better. Do not share makeup with anyone.  Avoid rubbing your eyes.  Put a warm compress on your eyes 2 times per day for 10 minutes at a time or as told by your doctor.  If you were told to use an medicated cream or eye drops, use the medicine as told by your doctor. Do not stop using the medicine even if you feel better.  Keep all follow-up visits as told by your doctor. This is important. Contact a doctor if:  Your eyelids feel hot.  You have blisters on your eyelids.  You have a rash on your eyelids.  The swelling does not go away in 2-4 days.  The swelling gets worse. Get help right away if:  You have pain that gets worse.  You have pain that spreads to other parts of your face.  You have redness that gets worse.  You have redness that spreads to other parts of your face.  Your vision changes.  You have pain when you look at lights or things that move.  You have a fever. This information is not intended to replace advice given to you by your health care provider. Make sure you discuss any questions you have with your health care provider. Document Released: 02/09/2008 Document Revised: 10/08/2015 Document Reviewed: 08/25/2014 Elsevier Interactive Patient Education  2017 [employer]

## 2016-07-19 NOTE — Progress Notes (Addendum)
   Subjective:    Patient ID: Vincent Griffin, male    DOB: [birth year], 56 y.o.   MRN: [id number]  HPI Pt presents to the office today with swelling and erythemas or right upper lid that started Saturday. PT states it is becoming worse. States Sunday it was "matted" together. Denies any double vision, blurry vision, sore throat, or fever. States he has mild pain in the eye lid.    Review of Systems  Eyes: Positive for pain and discharge.  All other systems reviewed and are negative.      Objective:   Physical Exam  Constitutional: He is oriented to person, place, and time. He appears well-developed and well-nourished. No distress.  HENT:  Head: Normocephalic.  Right Ear: External ear normal.  Left Ear: External ear normal.  Mouth/Throat: Oropharynx is clear and moist.  Eyes: Pupils are equal, round, and reactive to [place]. Right eye exhibits exudate (swelling, erythemas, and tenderness present in right upper eye lid). Right eye exhibits no discharge. Left eye exhibits no discharge.  Neck: Normal range of motion. Neck supple. No thyromegaly present.  Cardiovascular: Normal rate, regular rhythm, normal heart sounds and intact distal pulses.   No murmur heard. Pulmonary/Chest: Effort normal. No respiratory distress. He has wheezes.  Abdominal: Soft. Bowel sounds are normal. He exhibits no distension. There is no tenderness.  Musculoskeletal: Normal range of motion. He exhibits no edema or tenderness.  Neurological: He is alert and oriented to person, place, and time.  Skin: Skin is warm and dry. No rash noted. No erythema.  Psychiatric: He has a normal mood and affect. His behavior is normal. Judgment and thought content normal.  Vitals reviewed.     BP (!) 137/100   Pulse 78   Temp 97.2 F (36.2 C) (Oral)   Ht 6' (1.829 m)   Wt (!) 304 lb 9.6 oz (138.2 kg)   BMI 41.31 kg/m      Assessment & Plan:  1. Blepharitis of right upper eyelid, unspecified type -Do not rub -Good hand  hygiene discussed -Warm compresses as needed -RTO prn - bacitracin ophthalmic ointment; Place 1 application into the right eye 3 (three) times daily. apply to eye  Dispense: 3.5 g; Refill: 0  2. Morbid obesity with BMI of 40.0-44.9, adult (HCC)  3. Current smoker  Pt encouraged to make CPE   Jannifer Rodneyhristy Jomo Forand, FNP

## 2017-11-15 ENCOUNTER — Encounter: Payer: Self-pay | Admitting: Physician Assistant

## 2017-11-15 ENCOUNTER — Ambulatory Visit: Payer: BLUE CROSS/BLUE SHIELD | Admitting: Physician Assistant

## 2017-11-15 VITALS — BP 113/74 | HR 83 | Temp 98.9°F | Ht 72.0 in | Wt 306.2 lb

## 2017-11-15 DIAGNOSIS — L02611 Cutaneous abscess of right foot: Secondary | ICD-10-CM | POA: Diagnosis not present

## 2017-11-15 DIAGNOSIS — L03031 Cellulitis of right toe: Secondary | ICD-10-CM | POA: Diagnosis not present

## 2017-11-15 MED ORDER — CEPHALEXIN 500 MG PO CAPS: 500.0000 mg | ORAL_CAPSULE | Freq: Two times a day (BID) | ORAL | 0 refills | Status: DC

## 2017-11-15 NOTE — Patient Instructions (Signed)

## 2017-11-15 NOTE — Addendum Note (Signed)
Addended by: Inis SizerWEBSTER, Belton Peplinski L on: 11/15/2017 04:55 PM   Modules accepted: Orders

## 2017-11-15 NOTE — Progress Notes (Addendum)
  Subjective:     Patient ID: Vincent Griffin, male   DOB: May 17, 1960, 57 y.o.   MRN: [id number]  HPI Redness between the toes on the R foot States he removed some dead skin and the next day had the redness Hx of cellulitis to the L arm years ago Denies any hx of MRSA No hx of diabetes  Review of Systems  Constitutional: Negative.    Denies any pain or drainage from the area No pain to the foot itself    Objective:   Physical Exam + erythema and sl induration to the webbing between the 3-4 th digits of the R foot extends to the prox foot No ulceration or lesion seen No drainage No TTP of same Good pulses/sensory to the foot       Assessment:     Cellulitis    Plan:     Keflex 500mg  bid x 10 days Keep area clean and dry Hold further peroxide use for now Work note Follow up in 2 days

## 2017-11-17 ENCOUNTER — Ambulatory Visit: Payer: BLUE CROSS/BLUE SHIELD | Admitting: Nurse Practitioner

## 2017-11-17 ENCOUNTER — Encounter: Payer: Self-pay | Admitting: Nurse Practitioner

## 2017-11-17 VITALS — BP 98/67 | HR 85 | Temp 97.5°F | Ht 72.0 in | Wt 307.0 lb

## 2017-11-17 DIAGNOSIS — L03031 Cellulitis of right toe: Secondary | ICD-10-CM

## 2017-11-17 DIAGNOSIS — L02611 Cutaneous abscess of right foot: Secondary | ICD-10-CM | POA: Diagnosis not present

## 2017-11-17 MED ORDER — CEFTRIAXONE SODIUM 1 G IJ SOLR
1.0000 g | Freq: Once | INTRAMUSCULAR | Status: AC
  Administered 2017-11-17: 1 g via INTRAMUSCULAR

## 2017-11-17 MED ORDER — CEPHALEXIN 500 MG PO CAPS: 500.0000 mg | ORAL_CAPSULE | Freq: Three times a day (TID) | ORAL | 0 refills | Status: DC

## 2017-11-17 NOTE — Progress Notes (Addendum)
   Subjective:    Patient ID: Vincent Griffin, male    DOB: [birth year], 57 y.o.   MRN: [id number]   Chief Complaint: Follow-up (foot)   HPI Patient was seen on 11/15/17 by B. Webster pa with cellulitis of right foot. He was given keflex 500mg  bid. He is here today for follow up to make sure antibiotics are helping.    Review of Systems  Constitutional: Negative.   Respiratory: Negative.   Cardiovascular: Negative.   Skin:       Cellulitis of right foot  Neurological: Negative.   Psychiatric/Behavioral: Negative.   All other systems reviewed and are negative.      Objective:   Physical Exam  Constitutional: He is oriented to person, place, and time. He appears well-developed and well-nourished. No distress.  Cardiovascular: Normal rate.  Pulmonary/Chest: Effort normal.  Neurological: He is alert and oriented to person, place, and time.  Skin: Skin is warm.  Erythema webb space of right 3-4 toe- with erythema extending up right 3rd and 4 th toe- slightly warm to touch. Brisk cap refill around 2 seconds.         BP 98/67   Pulse 85   Temp (!) 97.5 F (36.4 C) (Oral)   Ht 6' (1.829 m)   Wt (!) 307 lb (139.3 kg)   BMI 41.64 kg/m        Assessment & Plan:  Vincent Griffin in today with chief complaint of Follow-up (foot)   1. Cellulitis and abscess of toe of right foot Increase keflex to TID Soak in epsom salt bid Recheck on monday - cefTRIAXone (ROCEPHIN) injection 1 g - cephALEXin (KEFLEX) 500 MG capsule; Take 1 capsule (500 mg total) by mouth 3 (three) times daily.  Dispense: 14 capsule; Refill: 0  Vincent Daphine DeutscherMartin, FNP

## 2017-11-20 ENCOUNTER — Encounter: Payer: Self-pay | Admitting: Nurse Practitioner

## 2017-11-20 ENCOUNTER — Ambulatory Visit: Payer: BLUE CROSS/BLUE SHIELD | Admitting: Nurse Practitioner

## 2017-11-20 VITALS — BP 111/75 | HR 71 | Temp 96.9°F | Ht 72.0 in | Wt 310.0 lb

## 2017-11-20 DIAGNOSIS — L03031 Cellulitis of right toe: Secondary | ICD-10-CM

## 2017-11-20 DIAGNOSIS — L02611 Cutaneous abscess of right foot: Secondary | ICD-10-CM

## 2017-11-20 NOTE — Progress Notes (Signed)
   Subjective:    Patient ID: Vincent Griffin, male    DOB: 05-Apr-1961, 57 y.o.   MRN: [id number]   Chief Complaint: cellulitis of right foot  HPI Patient was originally seen on July 3 by B. Webster with cellulitis of right foot at 3-4 webb space. He was started on keflex BID. He returned to office on July 5th for follow up and the infection seemed to have worsenened. We gave him a rocephin shot and increased keflex from bid to tid. He comes in today for us to recheck if improving.    Review of Systems  Constitutional: Negative for activity change and appetite change.  HENT: Negative.   Eyes: Negative for pain.  Respiratory: Negative for shortness of breath.   Cardiovascular: Negative for chest pain, palpitations and leg swelling.  Gastrointestinal: Negative for abdominal pain.  Endocrine: Negative for polydipsia.  Genitourinary: Negative.   Skin: Negative for rash.  Neurological: Negative for dizziness, weakness and headaches.  Hematological: Does not bruise/bleed easily.  Psychiatric/Behavioral: Negative.   All other systems reviewed and are negative.      Objective:   Physical Exam  Constitutional: He is oriented to person, place, and time. He appears well-developed and well-nourished. No distress.  Cardiovascular: Normal rate.  Pulmonary/Chest: Effort normal.  Neurological: He is alert and oriented to person, place, and time.  Skin: Skin is warm. Capillary refill takes less than 2 seconds.  Webb space right foot not as red as it was and is cool to touch.  Psychiatric: He has a normal mood and affect. His behavior is normal. Judgment and thought content normal.      BP 111/75   Pulse 71   Temp (!) 96.9 F (36.1 C) (Oral)   Ht 6' (1.829 m)   Wt (!) 310 lb (140.6 kg)   BMI 42.04 kg/m        Assessment & Plan:  Vincent Griffin in today with chief complaint of Recheck toe   1. Cellulitis and abscess of toe of right foot Continue epsom salt soaks BID Continue keflex  until gone If changes or worsens come see me ASAP.  Mary-Margaret Daphine DeutscherMartin, FNP

## 2018-03-12 DIAGNOSIS — Z23 Encounter for immunization: Secondary | ICD-10-CM | POA: Diagnosis not present

## 2019-11-27 ENCOUNTER — Other Ambulatory Visit: Payer: Self-pay

## 2019-11-27 ENCOUNTER — Ambulatory Visit: Payer: BC Managed Care – PPO | Admitting: Family Medicine

## 2019-11-27 ENCOUNTER — Encounter: Payer: Self-pay | Admitting: Family Medicine

## 2019-11-27 VITALS — BP 136/85 | HR 70 | Temp 97.0°F | Resp 20 | Ht 72.0 in | Wt 316.5 lb

## 2019-11-27 DIAGNOSIS — T63331A Toxic effect of venom of brown recluse spider, accidental (unintentional), initial encounter: Secondary | ICD-10-CM | POA: Diagnosis not present

## 2019-11-27 DIAGNOSIS — L03313 Cellulitis of chest wall: Secondary | ICD-10-CM

## 2019-11-27 MED ORDER — DAPSONE 100 MG PO TABS: 100.0000 mg | ORAL_TABLET | Freq: Every day | ORAL | 0 refills | Status: DC

## 2019-11-27 MED ORDER — AMOXICILLIN-POT CLAVULANATE 875-125 MG PO TABS: 1.0000 | ORAL_TABLET | Freq: Two times a day (BID) | ORAL | 0 refills | Status: DC

## 2019-11-27 NOTE — Progress Notes (Signed)
   Subjective:  Patient ID: Vincent Griffin, male    DOB: 07-Jun-1960  Age: 59 y.o. MRN: [id number]  CC: Bite on left side   HPI HERALD VALLIN presents for bitten at the left side of his torso.  At onset he says it burned it first.  This was yesterday.  Today it looks red.  He is concerned for a spider bite.  Depression screen [facility] 2/9 11/27/2019 11/20/2017 11/17/2017  Decreased Interest 0 0 0  Down, Depressed, Hopeless 0 0 0  PHQ - 2 Score 0 0 0    History Danyel has a past medical history of Chronic kidney disease.   He has a past surgical history that includes Small intestine surgery (1972); Cataract extraction; and Cataract extraction w/PHACO (Left, 01/07/2016).   His family history includes Diabetes in his mother; Stroke in his mother.He reports that he has been smoking cigarettes. He has been smoking about 1.00 pack per day. He has never used smokeless tobacco. He reports that he does not drink alcohol and does not use drugs.    ROS Review of Systems  Constitutional: Negative for fatigue and fever.  Respiratory: Negative.   Cardiovascular: Negative.     Objective:  BP 136/85   Pulse 70   Temp (!) 97 F (36.1 C) (Temporal)   Resp 20   Ht 6' (1.829 m)   Wt (!) 316 lb 8 oz (143.6 kg)   SpO2 94%   BMI 42.93 kg/m   BP Readings from Last 3 Encounters:  11/27/19 136/85  11/20/17 111/75  11/17/17 98/67    Wt Readings from Last 3 Encounters:  11/27/19 (!) 316 lb 8 oz (143.6 kg)  11/20/17 (!) 310 lb (140.6 kg)  11/17/17 (!) 307 lb (139.3 kg)     Physical Exam  There is a 5 x 7 cm erythematous lesion at the left side of his chest several centimeters below the axilla.  It has a 3 mm central blister.  Assessment & Plan:   Brysun was seen today for bite on left side.  Diagnoses and all orders for this visit:  Cellulitis of chest wall  Brown recluse spider bite or sting, accidental or unintentional, initial encounter  Other orders -     dapsone 100 MG tablet; Take 1  tablet (100 mg total) by mouth daily. -     amoxicillin-clavulanate (AUGMENTIN) 875-125 MG tablet; Take 1 tablet by mouth 2 (two) times daily. Take all of this medication       I have discontinued Baldo Ash A. Ewell's cephALEXin. I am also having him start on dapsone and amoxicillin-clavulanate.  Allergies as of 11/27/2019   No Known Allergies     Medication List       Accurate as of November 27, 2019 11:59 PM. If you have any questions, ask your nurse or doctor.        STOP taking these medications   cephALEXin 500 MG capsule Commonly known as: Keflex Stopped by: Mechele Claude, MD     TAKE these medications   amoxicillin-clavulanate 875-125 MG tablet Commonly known as: AUGMENTIN Take 1 tablet by mouth 2 (two) times daily. Take all of this medication Started by: Mechele Claude, MD   dapsone 100 MG tablet Take 1 tablet (100 mg total) by mouth daily. Started by: Mechele Claude, MD        Follow-up: Return if symptoms worsen or fail to improve.  Mechele Claude, M.D.

## 2019-12-01 ENCOUNTER — Encounter: Payer: Self-pay | Admitting: Family Medicine

## 2020-05-16 DIAGNOSIS — I219 Acute myocardial infarction, unspecified: Secondary | ICD-10-CM

## 2020-05-16 HISTORY — DX: Acute myocardial infarction, unspecified: I21.9

## 2020-05-28 ENCOUNTER — Telehealth: Payer: Self-pay | Admitting: Family

## 2020-05-28 NOTE — Telephone Encounter (Signed)
Pt stated that he has cellulitis on right elbow and it is now busting open and puss is coming out. Wants to know what he can do

## 2020-05-29 ENCOUNTER — Encounter: Payer: Self-pay | Admitting: Family

## 2020-05-29 ENCOUNTER — Ambulatory Visit: Payer: BC Managed Care – PPO | Admitting: Family

## 2020-05-29 ENCOUNTER — Other Ambulatory Visit: Payer: Self-pay

## 2020-05-29 VITALS — BP 125/79 | HR 90 | Temp 97.2°F | Ht 72.0 in | Wt 325.4 lb

## 2020-05-29 DIAGNOSIS — F17200 Nicotine dependence, unspecified, uncomplicated: Secondary | ICD-10-CM | POA: Diagnosis not present

## 2020-05-29 DIAGNOSIS — L03119 Cellulitis of unspecified part of limb: Secondary | ICD-10-CM | POA: Diagnosis not present

## 2020-05-29 DIAGNOSIS — Z6841 Body Mass Index (BMI) 40.0 and over, adult: Secondary | ICD-10-CM | POA: Diagnosis not present

## 2020-05-29 DIAGNOSIS — Z23 Encounter for immunization: Secondary | ICD-10-CM

## 2020-05-29 MED ORDER — DOXYCYCLINE HYCLATE 100 MG PO TABS: 100.0000 mg | ORAL_TABLET | Freq: Two times a day (BID) | ORAL | 0 refills | Status: DC

## 2020-05-29 NOTE — Telephone Encounter (Signed)
Please make appointment with me today. I have 2:40 pm available.

## 2020-05-29 NOTE — Telephone Encounter (Signed)
Patient aware and verbalized understanding. °

## 2020-05-29 NOTE — Patient Instructions (Signed)

## 2020-05-29 NOTE — Progress Notes (Signed)
Subjective:    Patient ID: Vincent Griffin, male    DOB: 09-Feb-1961, 60 y.o.   MRN: [id number]  Chief Complaint  Patient presents with  . Cellulitis    Right arm noticed Wednesday night     HPI PT presents to the office today with right elbow swelling, tenderness, drainage, and warmth. He noticed this Wednesday that has gradually worsen.   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   He reports a thick yellow discharge Wednesday. Denies any fever.    Review of Systems  Skin: Positive for wound.  All other systems reviewed and are negative.      Objective:   Physical Exam Vitals reviewed.  Constitutional:      General: He is not in acute distress.    Appearance: He is well-developed and well-nourished. He is obese.  HENT:     Head: Normocephalic.     Mouth/Throat:     Mouth: Oropharynx is clear and moist.  Eyes:     General:        Right eye: No discharge.        Left eye: No discharge.     Pupils: Pupils are equal, round, and reactive to [place].  Neck:     Thyroid: No thyromegaly.  Cardiovascular:     Rate and Rhythm: Normal rate and regular rhythm.     Pulses: Intact distal pulses.     Heart sounds: Normal heart sounds. No murmur heard.   Pulmonary:     Effort: Pulmonary effort is normal. No respiratory distress.     Breath sounds: Normal breath sounds. No wheezing.  Abdominal:     General: Bowel sounds are normal. There is no distension.     Palpations: Abdomen is soft.     Tenderness: There is no abdominal tenderness.  Musculoskeletal:        General: No tenderness or edema. Normal range of motion.     Cervical back: Normal range of motion and neck supple.  Skin:    General: Skin is warm and dry.     Findings: Erythema present. No rash.          Comments: Right erythemas of elbow, 9X8 cm that is tender, warm. Denies any discharge.   Neurological:     Mental  Status: He is alert and oriented to person, place, and time.     Cranial Nerves: No cranial nerve deficit.     Deep Tendon Reflexes: Reflexes are normal and symmetric.  Psychiatric:        Mood and Affect: Mood and affect normal.        Behavior: Behavior normal.        Thought Content: Thought content normal.        Judgment: Judgment normal.          BP 125/79   Pulse 90   Temp (!) 97.2 F (36.2 C) (Temporal)   Ht 6' (1.829 m)   Wt (!) 325 lb 6.4 oz (147.6 kg)   BMI 44.13 kg/m   Assessment & Plan:  [name] comes in today with chief complaint of Cellulitis (Right arm noticed Wednesday night )   Diagnosis and orders addressed:  1. Cellulitis of elbow Keep elevated Report any increased redness, tenderness, discharge, or pain RTO in 1 week  TDAP given today - doxycycline (VIBRA-TABS) 100 MG tablet; Take  1 tablet (100 mg total) by mouth 2 (two) times daily.  Dispense: 20 tablet; Refill: 0  2. Current smoker   3. Morbid obesity with BMI of 40.0-44.9, adult (HCC)   Follow up plan: 1 week for follow up and for CPE    [name], FNP

## 2020-06-05 ENCOUNTER — Encounter: Payer: Self-pay | Admitting: Family

## 2020-06-05 ENCOUNTER — Other Ambulatory Visit: Payer: Self-pay

## 2020-06-05 ENCOUNTER — Ambulatory Visit: Payer: BC Managed Care – PPO | Admitting: Family

## 2020-06-05 VITALS — BP 127/77 | HR 73 | Temp 97.4°F | Ht 72.0 in | Wt 324.8 lb

## 2020-06-05 DIAGNOSIS — Z0000 Encounter for general adult medical examination without abnormal findings: Secondary | ICD-10-CM

## 2020-06-05 DIAGNOSIS — F17200 Nicotine dependence, unspecified, uncomplicated: Secondary | ICD-10-CM

## 2020-06-05 DIAGNOSIS — Z114 Encounter for screening for human immunodeficiency virus [HIV]: Secondary | ICD-10-CM

## 2020-06-05 DIAGNOSIS — Z1159 Encounter for screening for other viral diseases: Secondary | ICD-10-CM | POA: Diagnosis not present

## 2020-06-05 DIAGNOSIS — Z0001 Encounter for general adult medical examination with abnormal findings: Secondary | ICD-10-CM

## 2020-06-05 DIAGNOSIS — Z1211 Encounter for screening for malignant neoplasm of colon: Secondary | ICD-10-CM

## 2020-06-05 NOTE — Patient Instructions (Signed)

## 2020-06-05 NOTE — Progress Notes (Addendum)
Subjective:    Patient ID: Vincent Griffin, male    DOB: 07/06/1960, 60 y.o.   MRN: [id number]  Chief Complaint  Patient presents with  . Annual Exam   Pt presents to the office today for CPE and recheck of cellulitis of right elbow. He was seen on 05/30/19 with increased redness, swelling, and tenderness and was started on doxycycline. He reports it has improved greatly. Mild redness, itching. Denies any pain, fevers, discharge, or warmth.  He is a current smoker of a pack a day for 40 years.   Nicotine Dependence Presents for follow-up visit. His urge triggers include company of smokers. The symptoms have been stable. He smokes 1 pack of cigarettes per day. Compliance with prior treatments has been good.      Review of Systems  All other systems reviewed and are negative.  Family History  Problem Relation Age of Onset  . Diabetes Mother   . Stroke Mother    Social History   Socioeconomic History  . Marital status: Married    Spouse name: Not on file  . Number of children: Not on file  . Years of education: Not on file  . Highest education level: Not on file  Occupational History  . Not on file  Tobacco Use  . Smoking status: Heavy Tobacco Smoker    Packs/day: 1.00    Types: Cigarettes  . Smokeless tobacco: Never Used  Substance and Sexual Activity  . Alcohol use: No  . Drug use: No  . Sexual activity: Not on file  Other Topics Concern  . Not on file  Social History Narrative  . Not on file   Social Determinants of Health   Financial Resource Strain: Not on file  Food Insecurity: Not on file  Transportation Needs: Not on file  Physical Activity: Not on file  Stress: Not on file  Social Connections: Not on file       Objective:   Physical Exam Vitals reviewed.  Constitutional:      General: He is not in acute distress.    Appearance: He is well-developed and well-nourished. He is obese.  HENT:     Head: Normocephalic.     Right Ear: Tympanic membrane  normal.     Left Ear: Tympanic membrane normal.     Mouth/Throat:     Mouth: Oropharynx is clear and moist.  Eyes:     General:        Right eye: No discharge.        Left eye: No discharge.     Pupils: Pupils are equal, round, and reactive to [place].  Neck:     Thyroid: No thyromegaly.  Cardiovascular:     Rate and Rhythm: Normal rate and regular rhythm.     Pulses: Intact distal pulses.     Heart sounds: Normal heart sounds. No murmur heard.   Pulmonary:     Effort: Pulmonary effort is normal. No respiratory distress.     Breath sounds: Normal breath sounds. No wheezing.  Abdominal:     General: Bowel sounds are normal. There is no distension.     Palpations: Abdomen is soft.     Tenderness: There is no abdominal tenderness.  Musculoskeletal:        General: No tenderness. Normal range of motion.     Cervical back: Normal range of motion and neck supple.     Right lower leg: Edema (trace) present.     Left lower leg: Edema (  trace) present.  Skin:    General: Skin is warm and dry.     Findings: Erythema present. No rash.     Comments: Slight erythemas of right elbow, no warmth or tenderness noted   Neurological:     Mental Status: He is alert and oriented to person, place, and time.     Cranial Nerves: No cranial nerve deficit.     Deep Tendon Reflexes: Reflexes are normal and symmetric.  Psychiatric:        Mood and Affect: Mood and affect normal.        Behavior: Behavior normal.        Thought Content: Thought content normal.        Judgment: Judgment normal.          BP 127/77   Pulse 73   Temp (!) 97.4 F (36.3 C) (Temporal)   Ht 6' (1.829 m)   Wt (!) 324 lb 12.8 oz (147.3 kg)   BMI 44.05 kg/m   Assessment & Plan:  Vincent Griffin comes in today with chief complaint of Annual Exam   Diagnosis and orders addressed:  1. Annual physical exam - Ambulatory referral to Gastroenterology - CMP14+EGFR - CBC with Differential/Platelet - Lipid panel - HIV  Antibody (routine testing w rflx) - Hepatitis C antibody - TSH - PSA, total and free  2. Current smoker - CMP14+EGFR - CBC with Differential/Platelet  3. Morbid obesity ([place]) - CMP14+EGFR - CBC with Differential/Platelet  4. Colon cancer screening - Ambulatory referral to Gastroenterology - CMP14+EGFR - CBC with Differential/Platelet  5. Encounter for screening for HIV - CMP14+EGFR - CBC with Differential/Platelet - HIV Antibody (routine testing w rflx)  6. Need for hepatitis C screening test - CMP14+EGFR - CBC with Differential/Platelet - Hepatitis C antibody  Refuses Low dose CT scan Labs pending Health Maintenance reviewed Diet and exercise encouraged  Follow up plan: 1 year    Evelina Dun, FNP

## 2020-06-06 LAB — LIPID PANEL
Chol/HDL Ratio: 5.2 ratio — ABNORMAL HIGH (ref 0.0–5.0)
Cholesterol, Total: 187 mg/dL (ref 100–199)
HDL: 36 mg/dL — ABNORMAL LOW (ref >39)
LDL Chol Calc (NIH): 127 mg/dL — ABNORMAL HIGH (ref 0–99)
Triglycerides: 133 mg/dL (ref 0–149)
VLDL Cholesterol Cal: 24 mg/dL (ref 5–40)

## 2020-06-06 LAB — CMP14+EGFR
ALT: 23 IU/L (ref 0–44)
AST: 25 IU/L (ref 0–40)
Albumin/Globulin Ratio: 1.4 (ref 1.2–2.2)
Albumin: 4 g/dL (ref 3.8–4.9)
Alkaline Phosphatase: 82 IU/L (ref 44–121)
BUN/Creatinine Ratio: 12 (ref 9–20)
BUN: 13 mg/dL (ref 6–24)
Bilirubin Total: 0.5 mg/dL (ref 0.0–1.2)
CO2: 30 mmol/L — ABNORMAL HIGH (ref 20–29)
Calcium: 9.5 mg/dL (ref 8.7–10.2)
Chloride: 101 mmol/L (ref 96–106)
Creatinine, Ser: 1.09 mg/dL (ref 0.76–1.27)
GFR calc Af Amer: 87 mL/min/{1.73_m2} (ref >59)
GFR calc non Af Amer: 75 mL/min/{1.73_m2} (ref >59)
Globulin, Total: 2.8 g/dL (ref 1.5–4.5)
Glucose: 82 mg/dL (ref 65–99)
Potassium: 5 mmol/L (ref 3.5–5.2)
Sodium: 140 mmol/L (ref 134–144)
Total Protein: 6.8 g/dL (ref 6.0–8.5)

## 2020-06-06 LAB — CBC WITH DIFFERENTIAL/PLATELET
Basophils Absolute: 0.1 10*3/uL (ref 0.0–0.2)
Basos: 1 %
EOS (ABSOLUTE): 0.2 10*3/uL (ref 0.0–0.4)
Eos: 2 %
Hematocrit: 44.1 % (ref 37.5–51.0)
Hemoglobin: 15.3 g/dL (ref 13.0–17.7)
Immature Grans (Abs): 0 10*3/uL (ref 0.0–0.1)
Immature Granulocytes: 0 %
Lymphocytes Absolute: 2.1 10*3/uL (ref 0.7–3.1)
Lymphs: 27 %
MCH: 33 pg (ref 26.6–33.0)
MCHC: 34.7 g/dL (ref 31.5–35.7)
MCV: 95 fL (ref 79–97)
Monocytes Absolute: 0.6 10*3/uL (ref 0.1–0.9)
Monocytes: 7 %
Neutrophils Absolute: 5.1 10*3/uL (ref 1.4–7.0)
Neutrophils: 63 %
Platelets: 222 10*3/uL (ref 150–450)
RBC: 4.63 x10E6/uL (ref 4.14–5.80)
RDW: 12 % (ref 11.6–15.4)
WBC: 8 10*3/uL (ref 3.4–10.8)

## 2020-06-06 LAB — HEPATITIS C ANTIBODY: Hep C Virus Ab: <0.1 s/co ratio (ref 0.0–0.9)

## 2020-06-06 LAB — HIV ANTIBODY (ROUTINE TESTING W REFLEX): HIV Screen 4th Generation wRfx: NONREACTIVE

## 2020-06-06 LAB — PSA, TOTAL AND FREE
PSA, Free Pct: 28 %
PSA, Free: 0.14 ng/mL
Prostate Specific Ag, Serum: 0.5 ng/mL (ref 0.0–4.0)

## 2020-06-06 LAB — TSH: TSH: 1.51 u[IU]/mL (ref 0.450–4.500)

## 2020-06-08 ENCOUNTER — Other Ambulatory Visit: Payer: Self-pay | Admitting: Family

## 2020-06-08 DIAGNOSIS — E785 Hyperlipidemia, unspecified: Secondary | ICD-10-CM

## 2020-06-08 MED ORDER — ROSUVASTATIN CALCIUM 10 MG PO TABS: 10.0000 mg | ORAL_TABLET | Freq: Every day | ORAL | 3 refills | Status: DC

## 2020-06-09 ENCOUNTER — Encounter: Payer: Self-pay | Admitting: Internal Medicine

## 2020-06-12 ENCOUNTER — Telehealth: Payer: Self-pay

## 2020-06-12 NOTE — Telephone Encounter (Signed)
Spoke with patient and he was verbalized he will be having a consult with his Gi specialist and that eased his mind.

## 2020-06-12 NOTE — Telephone Encounter (Signed)
Left message to call back  

## 2020-06-12 NOTE — Telephone Encounter (Signed)
Returning call.

## 2020-06-26 DIAGNOSIS — Z6841 Body Mass Index (BMI) 40.0 and over, adult: Secondary | ICD-10-CM | POA: Diagnosis not present

## 2020-06-26 DIAGNOSIS — E785 Hyperlipidemia, unspecified: Secondary | ICD-10-CM | POA: Diagnosis not present

## 2020-07-14 HISTORY — PX: CARDIAC CATHETERIZATION: SHX172

## 2020-07-22 ENCOUNTER — Ambulatory Visit: Payer: BLUE CROSS/BLUE SHIELD | Admitting: Nurse Practitioner

## 2020-07-23 ENCOUNTER — Ambulatory Visit: Payer: Self-pay | Admitting: Nurse Practitioner

## 2020-08-12 DIAGNOSIS — R0600 Dyspnea, unspecified: Secondary | ICD-10-CM | POA: Diagnosis not present

## 2020-08-12 DIAGNOSIS — I213 ST elevation (STEMI) myocardial infarction of unspecified site: Secondary | ICD-10-CM | POA: Diagnosis not present

## 2020-08-12 DIAGNOSIS — I10 Essential (primary) hypertension: Secondary | ICD-10-CM | POA: Diagnosis not present

## 2020-08-12 DIAGNOSIS — F17200 Nicotine dependence, unspecified, uncomplicated: Secondary | ICD-10-CM | POA: Diagnosis not present

## 2020-08-12 DIAGNOSIS — Y92230 Patient room in hospital as the place of occurrence of the external cause: Secondary | ICD-10-CM | POA: Diagnosis not present

## 2020-08-12 DIAGNOSIS — Z6841 Body Mass Index (BMI) 40.0 and over, adult: Secondary | ICD-10-CM | POA: Diagnosis not present

## 2020-08-12 DIAGNOSIS — R079 Chest pain, unspecified: Secondary | ICD-10-CM | POA: Diagnosis not present

## 2020-08-12 DIAGNOSIS — I214 Non-ST elevation (NSTEMI) myocardial infarction: Secondary | ICD-10-CM | POA: Diagnosis not present

## 2020-08-12 DIAGNOSIS — I517 Cardiomegaly: Secondary | ICD-10-CM | POA: Diagnosis not present

## 2020-08-12 DIAGNOSIS — T45525A Adverse effect of antithrombotic drugs, initial encounter: Secondary | ICD-10-CM | POA: Diagnosis not present

## 2020-08-12 DIAGNOSIS — I071 Rheumatic tricuspid insufficiency: Secondary | ICD-10-CM | POA: Diagnosis not present

## 2020-08-12 DIAGNOSIS — E785 Hyperlipidemia, unspecified: Secondary | ICD-10-CM | POA: Diagnosis not present

## 2020-08-12 DIAGNOSIS — E7800 Pure hypercholesterolemia, unspecified: Secondary | ICD-10-CM | POA: Diagnosis not present

## 2020-08-12 DIAGNOSIS — Z720 Tobacco use: Secondary | ICD-10-CM | POA: Diagnosis not present

## 2020-08-12 DIAGNOSIS — I2510 Atherosclerotic heart disease of native coronary artery without angina pectoris: Secondary | ICD-10-CM | POA: Diagnosis not present

## 2020-08-12 DIAGNOSIS — I25119 Atherosclerotic heart disease of native coronary artery with unspecified angina pectoris: Secondary | ICD-10-CM | POA: Diagnosis not present

## 2020-08-13 DIAGNOSIS — I2510 Atherosclerotic heart disease of native coronary artery without angina pectoris: Secondary | ICD-10-CM | POA: Insufficient documentation

## 2020-08-19 DIAGNOSIS — F17200 Nicotine dependence, unspecified, uncomplicated: Secondary | ICD-10-CM | POA: Diagnosis not present

## 2020-08-19 DIAGNOSIS — M25431 Effusion, right wrist: Secondary | ICD-10-CM | POA: Diagnosis not present

## 2020-08-19 DIAGNOSIS — E785 Hyperlipidemia, unspecified: Secondary | ICD-10-CM | POA: Diagnosis not present

## 2020-08-19 DIAGNOSIS — I2510 Atherosclerotic heart disease of native coronary artery without angina pectoris: Secondary | ICD-10-CM | POA: Diagnosis not present

## 2020-08-26 ENCOUNTER — Encounter: Payer: Self-pay | Admitting: Internal Medicine

## 2020-08-26 ENCOUNTER — Ambulatory Visit: Payer: Self-pay | Admitting: Nurse Practitioner

## 2020-09-18 ENCOUNTER — Encounter: Payer: Self-pay | Admitting: Family Medicine

## 2020-09-23 DIAGNOSIS — J811 Chronic pulmonary edema: Secondary | ICD-10-CM | POA: Diagnosis not present

## 2020-09-23 DIAGNOSIS — Z955 Presence of coronary angioplasty implant and graft: Secondary | ICD-10-CM | POA: Diagnosis not present

## 2020-09-23 DIAGNOSIS — I214 Non-ST elevation (NSTEMI) myocardial infarction: Secondary | ICD-10-CM | POA: Diagnosis not present

## 2020-09-23 DIAGNOSIS — R042 Hemoptysis: Secondary | ICD-10-CM | POA: Diagnosis not present

## 2020-09-30 DIAGNOSIS — R0602 Shortness of breath: Secondary | ICD-10-CM | POA: Diagnosis not present

## 2020-09-30 DIAGNOSIS — I2510 Atherosclerotic heart disease of native coronary artery without angina pectoris: Secondary | ICD-10-CM | POA: Diagnosis not present

## 2020-10-13 DIAGNOSIS — I358 Other nonrheumatic aortic valve disorders: Secondary | ICD-10-CM | POA: Diagnosis not present

## 2020-10-13 DIAGNOSIS — I351 Nonrheumatic aortic (valve) insufficiency: Secondary | ICD-10-CM | POA: Diagnosis not present

## 2020-10-27 DIAGNOSIS — M436 Torticollis: Secondary | ICD-10-CM | POA: Diagnosis not present

## 2020-11-05 DIAGNOSIS — E785 Hyperlipidemia, unspecified: Secondary | ICD-10-CM | POA: Diagnosis not present

## 2020-11-05 DIAGNOSIS — F17200 Nicotine dependence, unspecified, uncomplicated: Secondary | ICD-10-CM | POA: Diagnosis not present

## 2020-11-05 DIAGNOSIS — I2510 Atherosclerotic heart disease of native coronary artery without angina pectoris: Secondary | ICD-10-CM | POA: Diagnosis not present

## 2021-03-10 ENCOUNTER — Ambulatory Visit: Payer: BC Managed Care – PPO | Admitting: Family Medicine

## 2021-03-10 ENCOUNTER — Other Ambulatory Visit: Payer: Self-pay

## 2021-03-10 ENCOUNTER — Encounter: Payer: Self-pay | Admitting: Family Medicine

## 2021-03-10 VITALS — BP 112/70 | HR 75 | Temp 97.9°F | Ht 72.0 in | Wt 326.2 lb

## 2021-03-10 DIAGNOSIS — M545 Low back pain, unspecified: Secondary | ICD-10-CM | POA: Diagnosis not present

## 2021-03-10 MED ORDER — TIZANIDINE HCL 4 MG PO TABS: 4.0000 mg | ORAL_TABLET | Freq: Four times a day (QID) | ORAL | 0 refills | Status: DC | PRN

## 2021-03-10 MED ORDER — MELOXICAM 15 MG PO TABS: 15.0000 mg | ORAL_TABLET | Freq: Every day | ORAL | 0 refills | Status: DC

## 2021-03-10 NOTE — Patient Instructions (Signed)

## 2021-03-10 NOTE — Progress Notes (Signed)
Acute Office Visit  Subjective:    Patient ID: Vincent Griffin, male    DOB: [birth year], 60 y.o.   MRN: [id number]  Chief Complaint  Patient presents with   Back Pain    HPI Patient is in today for low back pain x 2 days. The jpain started after working on a roof. The pain is on his left lower side. It is a 6/10. The pain is intermittent and sharp. It is worse with movement. He denies fever, chills, injury, numbness, tingling, or changes in bowel or bladder control. He has taken tizanidine with some improvement.   Past Medical History:  Diagnosis Date   Chronic kidney disease    Kidney stones    Past Surgical History:  Procedure Laterality Date   CATARACT EXTRACTION     CATARACT EXTRACTION W/PHACO Left 01/07/2016   Procedure: CATARACT EXTRACTION PHACO AND INTRAOCULAR LENS PLACEMENT LEFT EYE CDE=13.32;  Surgeon: [name], MD;  Location: [facility];  Service: Ophthalmology;  Laterality: Left;   SMALL INTESTINE SURGERY  1972    Family History  Problem Relation Age of Onset   Diabetes Mother    Stroke Mother     Social History   Socioeconomic History   Marital status: Married    Spouse name: Not on file   Number of children: Not on file   Years of education: Not on file   Highest education level: Not on file  Occupational History   Not on file  Tobacco Use   Smoking status: Heavy Smoker    Packs/day: 1.00    Types: Cigarettes   Smokeless tobacco: Never  Substance and Sexual Activity   Alcohol use: No   Drug use: No   Sexual activity: Not on file  Other Topics Concern   Not on file  Social History Narrative   Not on file   Social Determinants of Health   Financial Resource Strain: Not on file  Food Insecurity: Not on file  Transportation Needs: Not on file  Physical Activity: Not on file  Stress: Not on file  Social Connections: Not on file  Intimate Partner Violence: Not on file    Outpatient Medications Prior to Visit  Medication Sig Dispense Refill    aspirin 81 MG chewable tablet CHEW AND SWALLOW 1 TABLET (81 MG) BY MOUTH DAILY     atorvastatin (LIPITOR) 80 MG tablet Take 80 mg by mouth at bedtime.     clopidogrel (PLAVIX) 75 MG tablet Take 75 mg by mouth daily.     metoprolol succinate (TOPROL-XL) 25 MG 24 hr tablet Take 25 mg by mouth daily.     nitroGLYCERIN (NITROSTAT) 0.4 MG SL tablet Place under the tongue.     tiZANidine (ZANAFLEX) 4 MG tablet Take 4 mg by mouth every 8 (eight) hours as needed.     doxycycline (VIBRA-TABS) 100 MG tablet Take 1 tablet (100 mg total) by mouth 2 (two) times daily. 20 tablet 0   rosuvastatin (CRESTOR) 10 MG tablet Take 1 tablet (10 mg total) by mouth daily. 90 tablet 3   No facility-administered medications prior to visit.    No Known Allergies  Review of Systems As per HPI.     Objective:    Physical Exam Vitals and nursing note reviewed.  Constitutional:      General: He is not in acute distress.    Appearance: He is not ill-appearing, toxic-appearing or diaphoretic.  HENT:     Head: Normocephalic and atraumatic.  Pulmonary:  Effort: Pulmonary effort is normal. No respiratory distress.  Musculoskeletal:     Cervical back: Normal.     Thoracic back: Normal.     Lumbar back: Tenderness (left paraspinal) present. No swelling, edema, deformity, signs of trauma or bony tenderness. Negative right straight leg raise test and negative left straight leg raise test.  Skin:    General: Skin is warm and dry.  Neurological:     Mental Status: He is alert and oriented to person, place, and time.     Motor: No weakness.     Gait: Gait normal.  Psychiatric:        Mood and Affect: Mood normal.        Behavior: Behavior normal.    BP 112/70   Pulse 75   Temp 97.9 F (36.6 C) (Temporal)   Ht 6' (1.829 m)   Wt (!) 326 lb 4 oz (148 kg)   BMI 44.25 kg/m  Wt Readings from Last 3 Encounters:  03/10/21 (!) 326 lb 4 oz (148 kg)  06/05/20 (!) 324 lb 12.8 oz (147.3 kg)  05/29/20 (!) 325 lb 6.4  oz (147.6 kg)    Health Maintenance Due  Topic Date Due   Zoster Vaccines- Shingrix (1 of 2) Never done   Pneumococcal Vaccine 40-52 Years old (2 - PPSV23 if available, else PCV20) 01/15/2016    There are no preventive care reminders to display for this patient.   Lab Results  Component Value Date   TSH 1.510 06/05/2020   Lab Results  Component Value Date   WBC 8.0 06/05/2020   HGB 15.3 06/05/2020   HCT 44.1 06/05/2020   MCV 95 06/05/2020   PLT 222 06/05/2020   Lab Results  Component Value Date   NA 140 06/05/2020   K 5.0 06/05/2020   CO2 30 (H) 06/05/2020   GLUCOSE 82 06/05/2020   BUN 13 06/05/2020   CREATININE 1.09 06/05/2020   BILITOT 0.5 06/05/2020   ALKPHOS 82 06/05/2020   AST 25 06/05/2020   ALT 23 06/05/2020   PROT 6.8 06/05/2020   ALBUMIN 4.0 06/05/2020   CALCIUM 9.5 06/05/2020   Lab Results  Component Value Date   CHOL 187 06/05/2020   Lab Results  Component Value Date   HDL 36 (L) 06/05/2020   Lab Results  Component Value Date   LDLCALC 127 (H) 06/05/2020   Lab Results  Component Value Date   TRIG 133 06/05/2020   Lab Results  Component Value Date   CHOLHDL 5.2 (H) 06/05/2020   No results found for: HGBA1C     Assessment & Plan:   Lebron was seen today for back pain.  Diagnoses and all orders for this visit:  Acute left-sided low back pain without sciatica Discussed lumbar strain. Mobic and tizanidine as below. Do not take other NSAIDs with mobic. Discussed heat, ice, rest. Work note provided. Return to office for new or worsening symptoms, or if symptoms persist.  -     meloxicam (MOBIC) 15 MG tablet; Take 1 tablet (15 mg total) by mouth daily. -     tiZANidine (ZANAFLEX) 4 MG tablet; Take 1 tablet (4 mg total) by mouth every 6 (six) hours as needed for muscle spasms.  The patient indicates understanding of these issues and agrees with the plan.  Gabriel Earing, FNP

## 2021-03-24 DIAGNOSIS — N200 Calculus of kidney: Secondary | ICD-10-CM | POA: Diagnosis not present

## 2021-03-24 DIAGNOSIS — I10 Essential (primary) hypertension: Secondary | ICD-10-CM | POA: Diagnosis not present

## 2021-03-24 DIAGNOSIS — Z28310 Unvaccinated for covid-19: Secondary | ICD-10-CM | POA: Diagnosis not present

## 2021-03-24 DIAGNOSIS — K5791 Diverticulosis of intestine, part unspecified, without perforation or abscess with bleeding: Secondary | ICD-10-CM | POA: Diagnosis not present

## 2021-03-24 DIAGNOSIS — I252 Old myocardial infarction: Secondary | ICD-10-CM | POA: Diagnosis not present

## 2021-03-24 DIAGNOSIS — K921 Melena: Secondary | ICD-10-CM | POA: Diagnosis not present

## 2021-03-24 DIAGNOSIS — I723 Aneurysm of iliac artery: Secondary | ICD-10-CM | POA: Diagnosis not present

## 2021-03-24 DIAGNOSIS — I2510 Atherosclerotic heart disease of native coronary artery without angina pectoris: Secondary | ICD-10-CM | POA: Diagnosis not present

## 2021-03-24 DIAGNOSIS — Z720 Tobacco use: Secondary | ICD-10-CM | POA: Diagnosis not present

## 2021-03-24 DIAGNOSIS — K5730 Diverticulosis of large intestine without perforation or abscess without bleeding: Secondary | ICD-10-CM | POA: Diagnosis not present

## 2021-03-24 DIAGNOSIS — K6389 Other specified diseases of intestine: Secondary | ICD-10-CM | POA: Diagnosis not present

## 2021-03-30 ENCOUNTER — Telehealth: Payer: Self-pay | Admitting: Family

## 2021-03-30 NOTE — Telephone Encounter (Deleted)
Pt would like to switch from [facility] to [facility] for PCP. Please call and let him know when a decision is made.

## 2021-03-30 NOTE — Telephone Encounter (Signed)
error 

## 2021-04-01 ENCOUNTER — Ambulatory Visit: Payer: BC Managed Care – PPO | Admitting: Family

## 2021-04-01 ENCOUNTER — Other Ambulatory Visit: Payer: Self-pay

## 2021-04-01 ENCOUNTER — Encounter: Payer: Self-pay | Admitting: Family

## 2021-04-01 VITALS — BP 125/81 | HR 71 | Temp 97.6°F | Ht 72.0 in | Wt 318.6 lb

## 2021-04-01 DIAGNOSIS — K625 Hemorrhage of anus and rectum: Secondary | ICD-10-CM | POA: Diagnosis not present

## 2021-04-01 DIAGNOSIS — Z09 Encounter for follow-up examination after completed treatment for conditions other than malignant neoplasm: Secondary | ICD-10-CM

## 2021-04-01 DIAGNOSIS — F17200 Nicotine dependence, unspecified, uncomplicated: Secondary | ICD-10-CM

## 2021-04-01 DIAGNOSIS — I252 Old myocardial infarction: Secondary | ICD-10-CM

## 2021-04-01 DIAGNOSIS — E785 Hyperlipidemia, unspecified: Secondary | ICD-10-CM | POA: Diagnosis not present

## 2021-04-01 NOTE — Patient Instructions (Signed)
Rectal Bleeding °Rectal bleeding is when blood passes out of the opening between the buttocks (anus). People with rectal bleeding may notice bright red blood in their underwear or in the toilet after having a bowel movement. They may also have blood mixed with their stool (feces), or dark red or black stools. °Rectal bleeding is usually a sign that something is wrong. Many things can cause rectal bleeding, including: °Diverticulosis. This is a condition in which pockets or sacs project from the bowel. °Hemorrhoids. These are blood vessels around the anus or inside the rectum that are larger than normal. °Anal fissures. This is a tear in the anus. °Proctitis and colitis. These are conditions in which the rectum, colon, or anus become inflamed. °Polyps. These are growths that can be cancerous (malignant) or noncancerous (benign). °Infections of the intestines. °Fistulas. These are abnormal openings in the rectum and anus. °Rectal prolapse. This is when a part of the rectum sticks out from the anus. °Follow these instructions at home: °Pay attention to any changes in your symptoms. Take these actions to help reduce bleeding and discomfort: °Medicines °Take over-the-counter and prescription medicines only as told by your health care provider. °Ask your health care provider about changing or stopping your regular medicines or supplements. This is especially important if you are taking blood thinners. Medicines that thin the blood can make rectal bleeding worse. °Managing constipation °Your condition may cause constipation. To prevent or treat constipation, or to help make your stools soft, you may need to: °Drink enough fluid to keep your urine pale yellow. °Take over-the-counter or prescription medicines. °Eat foods that are high in fiber, such as beans, whole grains, and fresh fruits and vegetables. Ask your health care provider if you need a supplement to give you more fiber. °Limit foods that are high in fat and  processed sugars, such as fried or sweet foods. ° °General instructions °Try not to strain when having a bowel movement. °Try taking a warm bath. This may help to soothe any pain in your rectum. °Keep all follow-up visits as told by your health care provider. This is important. °Contact a health care provider if you: °Have pain or tenderness in your abdomen. °Have a fever. °Have weakness. °Have nausea. °Cannot have a bowel movement. °Get help right away if you have: °New or increased rectal bleeding. °Black or dark red stools. °Vomit with blood or something that looks like coffee grounds. °A fainting episode. °Severe pain in your rectum. °Summary °Rectal bleeding is usually a sign that something is wrong. This condition should be evaluated by a health care provider. °Eat a diet that is high in fiber. This will help keep your stools soft, making it easier to pass stools without straining. °Medicines that thin the blood can make rectal bleeding worse. °Get help right away if you have new or increased rectal bleeding, black or dark red stools, blood in your vomit, an episode of fainting, or severe pain in your rectum. °This information is not intended to replace advice given to you by your health care provider. Make sure you discuss any questions you have with your health care provider. °Document Revised: 04/03/2019 Document Reviewed: 04/03/2019 °Elsevier Patient Education © 2022 Elsevier Inc. ° °

## 2021-04-01 NOTE — Progress Notes (Signed)
Subjective:    Patient ID: Vincent Griffin, male    DOB: Apr 20, 1961, 60 y.o.   MRN: [id number]  Chief Complaint  Patient presents with   Medical Management of Chronic Issues   Hospitalization Follow-up    ER for rectal bleeding    PT presents to the office today for chronic follow up and hospital follow up.  He had a NSTEMI in 08/12/20 and is followed by Cardiologists every 6 months.  He is morbid obese with a BMI of 43.  He started having some rectal bleeding that started a couple of weeks ago. He went to the ED and had a CT scan that showed diverticulosis and told to follow up with PCP. He has never had a colonoscopy. He reports the blood is dark red to bright red. Denies any pain with BMs.  Hyperlipidemia This is a chronic problem. The current episode started more than 1 year ago. Exacerbating diseases include obesity. The current treatment provides moderate improvement of lipids. Risk factors for coronary artery disease include dyslipidemia, male sex and a sedentary lifestyle.  Nicotine Dependence Presents for follow-up visit. His urge triggers include company of smokers. The symptoms have been stable. He smokes 1 pack of cigarettes per day.     Review of Systems  All other systems reviewed and are negative.     Objective:   Physical Exam Vitals reviewed.  Constitutional:      General: He is not in acute distress.    Appearance: He is well-developed. He is obese.  HENT:     Head: Normocephalic.     Right Ear: Tympanic membrane normal.     Left Ear: Tympanic membrane normal.  Eyes:     General:        Right eye: No discharge.        Left eye: No discharge.     Pupils: Pupils are equal, round, and reactive to [place].  Neck:     Thyroid: No thyromegaly.  Cardiovascular:     Rate and Rhythm: Normal rate and regular rhythm.     Heart sounds: Normal heart sounds. No murmur heard. Pulmonary:     Effort: Pulmonary effort is normal. No respiratory distress.     Breath sounds:  Rhonchi present. No wheezing.  Abdominal:     General: Bowel sounds are normal. There is no distension.     Palpations: Abdomen is soft.     Tenderness: There is no abdominal tenderness.  Musculoskeletal:        General: No tenderness. Normal range of motion.     Cervical back: Normal range of motion and neck supple.     Right lower leg: Edema (trace) present.     Left lower leg: Edema (trace) present.  Skin:    General: Skin is warm and dry.     Findings: No erythema or rash.  Neurological:     Mental Status: He is alert and oriented to person, place, and time.     Cranial Nerves: No cranial nerve deficit.     Deep Tendon Reflexes: Reflexes are normal and symmetric.  Psychiatric:        Behavior: Behavior normal.        Thought Content: Thought content normal.        Judgment: Judgment normal.      BP 125/81   Pulse 71   Temp 97.6 F (36.4 C) (Temporal)   Ht 6' (1.829 m)   Wt (!) 318 lb 9.6 oz (144.5  kg)   BMI 43.21 kg/m      Assessment & Plan:  Vincent Griffin comes in today with chief complaint of Medical Management of Chronic Issues and Hospitalization Follow-up (ER for rectal bleeding )   Diagnosis and orders addressed:  1. Hyperlipidemia, unspecified hyperlipidemia type - CMP14+EGFR - CBC with Differential/Platelet  2. Current smoker - CMP14+EGFR - CBC with Differential/Platelet  3. Morbid obesity ([place]) - CMP14+EGFR - CBC with Differential/Platelet  4. Rectal bleeding  - Ambulatory referral to Gastroenterology - CMP14+EGFR - CBC with Differential/Platelet  5. Hospital discharge follow-up - CMP14+EGFR - CBC with Differential/Platelet  6. H/O non-ST elevation myocardial infarction (NSTEMI) - CMP14+EGFR - CBC with Differential/Platelet   Labs pending Health Maintenance reviewed Diet and exercise encouraged  Follow up plan: 3 months    Evelina Dun, FNP

## 2021-04-02 LAB — CBC WITH DIFFERENTIAL/PLATELET
Basophils Absolute: 0.1 10*3/uL (ref 0.0–0.2)
Basos: 1 %
EOS (ABSOLUTE): 0.3 10*3/uL (ref 0.0–0.4)
Eos: 3 %
Hematocrit: 41 % (ref 37.5–51.0)
Hemoglobin: 13.9 g/dL (ref 13.0–17.7)
Immature Grans (Abs): 0 10*3/uL (ref 0.0–0.1)
Immature Granulocytes: 0 %
Lymphocytes Absolute: 2.1 10*3/uL (ref 0.7–3.1)
Lymphs: 25 %
MCH: 32.5 pg (ref 26.6–33.0)
MCHC: 33.9 g/dL (ref 31.5–35.7)
MCV: 96 fL (ref 79–97)
Monocytes Absolute: 0.6 10*3/uL (ref 0.1–0.9)
Monocytes: 8 %
Neutrophils Absolute: 5.2 10*3/uL (ref 1.4–7.0)
Neutrophils: 63 %
Platelets: 228 10*3/uL (ref 150–450)
RBC: 4.28 x10E6/uL (ref 4.14–5.80)
RDW: 12.6 % (ref 11.6–15.4)
WBC: 8.3 10*3/uL (ref 3.4–10.8)

## 2021-04-02 LAB — CMP14+EGFR
ALT: 23 IU/L (ref 0–44)
AST: 26 IU/L (ref 0–40)
Albumin/Globulin Ratio: 1.4 (ref 1.2–2.2)
Albumin: 4 g/dL (ref 3.8–4.9)
Alkaline Phosphatase: 93 IU/L (ref 44–121)
BUN/Creatinine Ratio: 11 (ref 9–20)
BUN: 12 mg/dL (ref 6–24)
Bilirubin Total: 0.5 mg/dL (ref 0.0–1.2)
CO2: 28 mmol/L (ref 20–29)
Calcium: 9.4 mg/dL (ref 8.7–10.2)
Chloride: 102 mmol/L (ref 96–106)
Creatinine, Ser: 1.11 mg/dL (ref 0.76–1.27)
Globulin, Total: 2.8 g/dL (ref 1.5–4.5)
Glucose: 89 mg/dL (ref 70–99)
Potassium: 5.1 mmol/L (ref 3.5–5.2)
Sodium: 142 mmol/L (ref 134–144)
Total Protein: 6.8 g/dL (ref 6.0–8.5)
eGFR: 77 mL/min/{1.73_m2} (ref >59)

## 2021-04-06 ENCOUNTER — Telehealth: Payer: Self-pay | Admitting: Family

## 2021-04-06 ENCOUNTER — Encounter: Payer: Self-pay | Admitting: Internal Medicine

## 2021-04-06 NOTE — Telephone Encounter (Signed)
REFERRAL REQUEST Telephone Note  Have you been seen at our office for this problem? yes (Advise that they may need an appointment with their PCP before a referral can be done)  Reason for Referral: colonoscopy Referral discussed with patient: yes  Best contact number of patient for referral team: [phone]    Has patient been seen by a specialist for this issue before: no  Patient provider preference for referral: none Patient location preference for referral: noe   Patient notified that referrals can take up to a week or longer to process. If they haven't heard anything within a week they should call back and speak with the referral department.    Vincent Griffin' pt.  He is bleeding & needs an appt ASAP.

## 2021-04-30 ENCOUNTER — Encounter: Payer: Self-pay | Admitting: Family Medicine

## 2021-04-30 ENCOUNTER — Ambulatory Visit (INDEPENDENT_AMBULATORY_CARE_PROVIDER_SITE_OTHER): Payer: BC Managed Care – PPO | Admitting: Family Medicine

## 2021-04-30 DIAGNOSIS — J329 Chronic sinusitis, unspecified: Secondary | ICD-10-CM | POA: Diagnosis not present

## 2021-04-30 DIAGNOSIS — J40 Bronchitis, not specified as acute or chronic: Secondary | ICD-10-CM | POA: Diagnosis not present

## 2021-04-30 MED ORDER — AMOXICILLIN-POT CLAVULANATE 875-125 MG PO TABS: 1.0000 | ORAL_TABLET | Freq: Two times a day (BID) | ORAL | 0 refills | Status: DC

## 2021-04-30 NOTE — Progress Notes (Signed)
Subjective:    Patient ID: Vincent Griffin, male    DOB: 1960/07/08, 60 y.o.   MRN: [id number]   HPI: Vincent Griffin is a 60 y.o. male presenting for Patient presents with chest congestion. Rhinorrhea that is scant.  Patient reports coughing frequently as well.  Yellow sputum noted. There is no fever, chills or sweats. The patient denies being short of breath. Onset was yesterday. Pt. Is a smoker. Gradually worsening.    Depression screen [facility] 2/9 03/10/2021 06/05/2020 05/29/2020 11/27/2019 11/20/2017  Decreased Interest 0 0 0 0 0  Down, Depressed, Hopeless 0 0 0 0 0  PHQ - 2 Score 0 0 0 0 0  Altered sleeping 0 - - - -  Tired, decreased energy 0 - - - -  Change in appetite 0 - - - -  Feeling bad or failure about yourself  0 - - - -  Trouble concentrating 0 - - - -  Moving slowly or fidgety/restless 0 - - - -  Suicidal thoughts 0 - - - -  PHQ-9 Score 0 - - - -  Difficult doing work/chores Not difficult at all - - - -     Relevant past medical, surgical, family and social history reviewed and updated as indicated.  Interim medical history since our last visit reviewed. Allergies and medications reviewed and updated.  ROS:  Review of Systems  Constitutional:  Negative for fever.  Respiratory:  Negative for shortness of breath.   Cardiovascular:  Negative for chest pain.  Musculoskeletal:  Negative for arthralgias.  Skin:  Negative for rash.    Social History   Tobacco Use  Smoking Status Heavy Smoker   Packs/day: 1.00   Types: Cigarettes  Smokeless Tobacco Never       Objective:     Wt Readings from Last 3 Encounters:  04/01/21 (!) 318 lb 9.6 oz (144.5 kg)  03/10/21 (!) 326 lb 4 oz (148 kg)  06/05/20 (!) 324 lb 12.8 oz (147.3 kg)     Exam deferred. Pt. Harboring due to COVID 19. Phone visit performed.   Assessment & Plan:   1. Sinobronchitis     Meds ordered this encounter  Medications   amoxicillin-clavulanate (AUGMENTIN) 875-125 MG tablet    Sig: Take 1  tablet by mouth 2 (two) times daily. Take all of this medication    Dispense:  20 tablet    Refill:  0         Diagnoses and all orders for this visit:  Sinobronchitis  Other orders -     amoxicillin-clavulanate (AUGMENTIN) 875-125 MG tablet; Take 1 tablet by mouth 2 (two) times daily. Take all of this medication   Virtual Visit via telephone Note  I discussed the limitations, risks, security and privacy concerns of performing an evaluation and management service by telephone and the availability of in person appointments. The patient was identified with two identifiers. Pt.expressed understanding and agreed to proceed. Pt. Is at home. Dr. Darlyn Read is in his office.  Follow Up Instructions:   I discussed the assessment and treatment plan with the patient. The patient was provided an opportunity to ask questions and all were answered. The patient agreed with the plan and demonstrated an understanding of the instructions.   The patient was advised to call back or seek an in-person evaluation if the symptoms worsen or if the condition fails to improve as anticipated.   Total minutes including chart review and phone contact time: 12  Follow up plan: Return if symptoms worsen or fail to improve.  Mechele Claude, MD [facility]

## 2021-05-07 ENCOUNTER — Telehealth: Payer: Self-pay | Admitting: Family

## 2021-05-07 ENCOUNTER — Encounter: Payer: Self-pay | Admitting: Family

## 2021-05-07 ENCOUNTER — Ambulatory Visit (INDEPENDENT_AMBULATORY_CARE_PROVIDER_SITE_OTHER): Payer: BC Managed Care – PPO | Admitting: Family

## 2021-05-07 DIAGNOSIS — M545 Low back pain, unspecified: Secondary | ICD-10-CM | POA: Diagnosis not present

## 2021-05-07 MED ORDER — BACLOFEN 10 MG PO TABS
10.0000 mg | ORAL_TABLET | Freq: Three times a day (TID) | ORAL | 0 refills | Status: DC
Start: 2021-05-07 — End: 2021-06-01

## 2021-05-07 MED ORDER — PREDNISONE 10 MG (21) PO TBPK: ORAL_TABLET | ORAL | 0 refills | Status: DC

## 2021-05-07 MED ORDER — DICLOFENAC SODIUM 75 MG PO TBEC
75.0000 mg | DELAYED_RELEASE_TABLET | Freq: Two times a day (BID) | ORAL | 0 refills | Status: DC
Start: 2021-05-07 — End: 2021-06-01

## 2021-05-07 NOTE — Progress Notes (Signed)
Virtual Visit  Note Due to COVID-19 pandemic this visit was conducted virtually. This visit type was conducted due to national recommendations for restrictions regarding the COVID-19 Pandemic (e.g. social distancing, sheltering in place) in an effort to limit this patient's exposure and mitigate transmission in our community. All issues noted in this document were discussed and addressed.  A physical exam was not performed with this format.  I connected with Vincent Griffin on 05/07/21 at 9:17 AM  by telephone and verified that I am speaking with the correct person using two identifiers. Vincent Griffin is currently located at home and wife is currently with him  during visit. The provider, [name], FNP is located in their office at time of visit.  I discussed the limitations, risks, security and privacy concerns of performing an evaluation and management service by telephone and the availability of in person appointments. I also discussed with the patient that there may be a patient responsible charge related to this service. The patient expressed understanding and agreed to proceed.  Vincent Griffin are scheduled for a virtual visit with your provider today.    Just as we do with appointments in the office, we must obtain your consent to participate.  Your consent will be active for this visit and any virtual visit you may have with one of our providers in the next 365 days.    If you have a MyChart account, I can also send a copy of this consent to you electronically.  All virtual visits are billed to your insurance company just like a traditional visit in the office.  As this is a virtual visit, video technology does not allow for your provider to perform a traditional examination.  This may limit your provider's ability to fully assess your condition.  If your provider identifies any concerns that need to be evaluated in person or the need to arrange testing such as labs, EKG, etc, we will make  arrangements to do so.    Although advances in technology are sophisticated, we cannot ensure that it will always work on either your end or our end.  If the connection with a video visit is poor, we may have to switch to a telephone visit.  With either a video or telephone visit, we are not always able to ensure that we have a secure connection.   I need to obtain your verbal consent now.   Are you willing to proceed with your visit today?   Vincent Griffin has provided verbal consent on 05/07/2021 for a virtual visit (video or telephone).   [name], [place] 05/07/2021  9:30 AM    History and Present Illness:  Back Pain This is a new problem. The current episode started in the past 7 days. The problem occurs constantly. The problem is unchanged. The pain is present in the lumbar spine. The quality of the pain is described as aching. The pain is at a severity of 7/10. The pain is moderate. The symptoms are aggravated by standing, twisting, bending and coughing. Pertinent negatives include no headaches, leg pain or weakness. Risk factors include obesity. He has tried bed rest and NSAIDs for the symptoms. The treatment provided mild relief.     Review of Systems  Musculoskeletal:  Positive for back pain.  Neurological:  Negative for weakness and headaches.    Observations/Objective: No SOB or distress noted   Assessment and Plan: 1. Acute bilateral low back pain without sciatica Rest ROM exercises  encouraged  No other NSAID's while taking diclofenac  Follow up if symptoms worsen or do not improve  - baclofen (LIORESAL) 10 MG tablet; Take 1 tablet (10 mg total) by mouth 3 (three) times daily.  Dispense: 30 each; Refill: 0 - diclofenac (VOLTAREN) 75 MG EC tablet; Take 1 tablet (75 mg total) by mouth 2 (two) times daily.  Dispense: 30 tablet; Refill: 0 - predniSONE (STERAPRED UNI-PAK 21 TAB) 10 MG (21) TBPK tablet; Use as directed  Dispense: 21 tablet; Refill: 0     I discussed the  assessment and treatment plan with the patient. The patient was provided an opportunity to ask questions and all were answered. The patient agreed with the plan and demonstrated an understanding of the instructions.   The patient was advised to call back or seek an in-person evaluation if the symptoms worsen or if the condition fails to improve as anticipated.  The above assessment and management plan was discussed with the patient. The patient verbalized understanding of and has agreed to the management plan. Patient is aware to call the clinic if symptoms persist or worsen. Patient is aware when to return to the clinic for a follow-up visit. Patient educated on when it is appropriate to go to the emergency department.   Time call ended:  9:28 AM   I provided 11 minutes of  non face-to-face time during this encounter.    [name], FNP

## 2021-05-31 DIAGNOSIS — I2510 Atherosclerotic heart disease of native coronary artery without angina pectoris: Secondary | ICD-10-CM | POA: Diagnosis not present

## 2021-05-31 DIAGNOSIS — E785 Hyperlipidemia, unspecified: Secondary | ICD-10-CM | POA: Diagnosis not present

## 2021-05-31 DIAGNOSIS — I214 Non-ST elevation (NSTEMI) myocardial infarction: Secondary | ICD-10-CM | POA: Diagnosis not present

## 2021-05-31 DIAGNOSIS — R0602 Shortness of breath: Secondary | ICD-10-CM | POA: Diagnosis not present

## 2021-06-01 ENCOUNTER — Ambulatory Visit (INDEPENDENT_AMBULATORY_CARE_PROVIDER_SITE_OTHER): Payer: BC Managed Care – PPO | Admitting: Gastroenterology

## 2021-06-01 ENCOUNTER — Encounter (HOSPITAL_COMMUNITY): Payer: Self-pay

## 2021-06-01 ENCOUNTER — Emergency Department (HOSPITAL_COMMUNITY): Payer: BC Managed Care – PPO

## 2021-06-01 ENCOUNTER — Encounter: Payer: Self-pay | Admitting: Gastroenterology

## 2021-06-01 ENCOUNTER — Other Ambulatory Visit: Payer: Self-pay

## 2021-06-01 ENCOUNTER — Emergency Department (HOSPITAL_COMMUNITY)
Admission: EM | Admit: 2021-06-01 | Discharge: 2021-06-02 | Disposition: A | Discharge status: Home / Self Care | Payer: BC Managed Care – PPO | Attending: Emergency Medicine | Admitting: Emergency Medicine

## 2021-06-01 DIAGNOSIS — N189 Chronic kidney disease, unspecified: Secondary | ICD-10-CM | POA: Insufficient documentation

## 2021-06-01 DIAGNOSIS — R062 Wheezing: Secondary | ICD-10-CM | POA: Diagnosis not present

## 2021-06-01 DIAGNOSIS — Z7982 Long term (current) use of aspirin: Secondary | ICD-10-CM | POA: Diagnosis not present

## 2021-06-01 DIAGNOSIS — Z7902 Long term (current) use of antithrombotics/antiplatelets: Secondary | ICD-10-CM | POA: Insufficient documentation

## 2021-06-01 DIAGNOSIS — K625 Hemorrhage of anus and rectum: Secondary | ICD-10-CM | POA: Insufficient documentation

## 2021-06-01 DIAGNOSIS — R0602 Shortness of breath: Secondary | ICD-10-CM | POA: Insufficient documentation

## 2021-06-01 DIAGNOSIS — I2510 Atherosclerotic heart disease of native coronary artery without angina pectoris: Secondary | ICD-10-CM | POA: Diagnosis not present

## 2021-06-01 DIAGNOSIS — R0789 Other chest pain: Secondary | ICD-10-CM | POA: Diagnosis not present

## 2021-06-01 DIAGNOSIS — R079 Chest pain, unspecified: Secondary | ICD-10-CM | POA: Diagnosis not present

## 2021-06-01 DIAGNOSIS — R0689 Other abnormalities of breathing: Secondary | ICD-10-CM | POA: Diagnosis not present

## 2021-06-01 DIAGNOSIS — R000 Tachycardia, unspecified: Secondary | ICD-10-CM | POA: Diagnosis not present

## 2021-06-01 LAB — COMPREHENSIVE METABOLIC PANEL
ALT: 24 U/L (ref 0–44)
AST: 21 U/L (ref 15–41)
Albumin: 3.5 g/dL (ref 3.5–5.0)
Alkaline Phosphatase: 96 U/L (ref 38–126)
Anion gap: 6 (ref 5–15)
BUN: 10 mg/dL (ref 6–20)
CO2: 29 mmol/L (ref 22–32)
Calcium: 8.5 mg/dL — ABNORMAL LOW (ref 8.9–10.3)
Chloride: 103 mmol/L (ref 98–111)
Creatinine, Ser: 1.02 mg/dL (ref 0.61–1.24)
GFR, Estimated: >60 mL/min (ref >60)
Glucose, Bld: 82 mg/dL (ref 70–99)
Potassium: 3.7 mmol/L (ref 3.5–5.1)
Sodium: 138 mmol/L (ref 135–145)
Total Bilirubin: 0.3 mg/dL (ref 0.3–1.2)
Total Protein: 6.9 g/dL (ref 6.5–8.1)

## 2021-06-01 LAB — CBC WITH DIFFERENTIAL/PLATELET
Abs Immature Granulocytes: 0.03 10*3/uL (ref 0.00–0.07)
Basophils Absolute: 0.1 10*3/uL (ref 0.0–0.1)
Basophils Relative: 1 %
Eosinophils Absolute: 0.4 10*3/uL (ref 0.0–0.5)
Eosinophils Relative: 4 %
HCT: 44.8 % (ref 39.0–52.0)
Hemoglobin: 14.3 g/dL (ref 13.0–17.0)
Immature Granulocytes: 0 %
Lymphocytes Relative: 31 %
Lymphs Abs: 2.6 10*3/uL (ref 0.7–4.0)
MCH: 32.7 pg (ref 26.0–34.0)
MCHC: 31.9 g/dL (ref 30.0–36.0)
MCV: 102.5 fL — ABNORMAL HIGH (ref 80.0–100.0)
Monocytes Absolute: 0.8 10*3/uL (ref 0.1–1.0)
Monocytes Relative: 10 %
Neutro Abs: 4.5 10*3/uL (ref 1.7–7.7)
Neutrophils Relative %: 54 %
Platelets: 205 10*3/uL (ref 150–400)
RBC: 4.37 MIL/uL (ref 4.22–5.81)
RDW: 13.6 % (ref 11.5–15.5)
WBC: 8.3 10*3/uL (ref 4.0–10.5)
nRBC: 0 % (ref 0.0–0.2)

## 2021-06-01 LAB — PROTIME-INR
INR: 1 (ref 0.8–1.2)
Prothrombin Time: 12.9 seconds (ref 11.4–15.2)

## 2021-06-01 LAB — MAGNESIUM: Magnesium: 1.9 mg/dL (ref 1.7–2.4)

## 2021-06-01 LAB — TROPONIN I (HIGH SENSITIVITY): Troponin I (High Sensitivity): 5 ng/L (ref <18)

## 2021-06-01 NOTE — Patient Instructions (Signed)
We will wait on scheduling colonoscopy until after 08/12/21, as this will be one year post-stent. We will reach out to Dr. Charlestine Night about holding Plavix at that time.  Please call if you have any further bleeding or worsening.   It was a pleasure to see you today. I want to create trusting relationships with patients to provide genuine, compassionate, and quality care. I value your feedback. If you receive a survey regarding your visit,  I greatly appreciate you taking time to fill this out.   Gelene Mink, PhD, ANP-BC [facility]

## 2021-06-01 NOTE — Progress Notes (Signed)
Primary Care Physician:  Junie Spencer, FNP Referring Physician: [name], FNP Primary Gastroenterologist:  Dr. Marletta Lor  Chief Complaint  Patient presents with   Rectal Bleeding    Last episode early December. None since. No diarrhea, no constipation. Never had TCS prior. Brother hx polyps.     HPI:   Vincent Griffin is a 61 y.o. male presenting today at the request of [name], FNP, due to rectal bleeding. No prior colonoscopy. No family history of colon cancer but does note brother with history of colon polyps.    Was seen 05/31/20 by Dr. Juleen Starr (cardiology with [facility]). History of NSTEMI with elevated troponin in 08/12/20, s/p cardiac cath with PCI and drug-eluting stent to the RCA. Recommendations to continue dual antiplatelet therapy and at 1 year stent anniversary can stop clopidogrel and stay on monotherapy aspirin. Per cardiology, he is "acceptable for a colonoscopy" and recommend waiting to complete the 1 year of dual antiplatelet therapy prior to colonoscopy.   Had a few episodes of painless rectal bleeding with last in December. No straining. No constipation. No abdominal pain. No weight loss or lack of appetite. No dysphagia. No GERD symptoms. No changes in bowel habits.   Had surgery as a child for what he says " a ball of tissue fell down around my intestines". Some type of small intestine surgery. He is unsure if any colon surgery.       Past Medical History:  Diagnosis Date   CAD (coronary artery disease)    Chronic kidney disease    Kidney stones   Hypercholesterolemia     Past Surgical History:  Procedure Laterality Date   CARDIAC CATHETERIZATION  07/2020   with stent.   CATARACT EXTRACTION     CATARACT EXTRACTION W/PHACO Left 01/07/2016   Procedure: CATARACT EXTRACTION PHACO AND INTRAOCULAR LENS PLACEMENT LEFT EYE CDE=13.32;  Surgeon: [name], MD;  Location: [facility];  Service: Ophthalmology;  Laterality: Left;   SMALL INTESTINE SURGERY   1972    Current Outpatient Medications  Medication Sig Dispense Refill   aspirin 81 MG chewable tablet CHEW AND SWALLOW 1 TABLET (81 MG) BY MOUTH DAILY     atorvastatin (LIPITOR) 80 MG tablet Take 80 mg by mouth at bedtime.     clopidogrel (PLAVIX) 75 MG tablet Take 75 mg by mouth daily.     metoprolol succinate (TOPROL-XL) 25 MG 24 hr tablet Take 25 mg by mouth daily.     Multiple Vitamin (MULTIVITAMIN) tablet Take 1 tablet by mouth daily.     nitroGLYCERIN (NITROSTAT) 0.4 MG SL tablet Place under the tongue.     tiZANidine (ZANAFLEX) 4 MG tablet Take 4 mg by mouth every 6 (six) hours as needed for muscle spasms.     No current facility-administered medications for this visit.    Allergies as of 06/01/2021   (No Known Allergies)    Family History  Problem Relation Age of Onset   Diabetes Mother    Stroke Mother    Colon polyps Brother    Colon cancer Neg Hx     Social History   Socioeconomic History   Marital status: Married    Spouse name: Not on file   Number of children: Not on file   Years of education: Not on file   Highest education level: Not on file  Occupational History   Not on file  Tobacco Use   Smoking status: Heavy Smoker    Packs/day: 1.00  Types: Cigarettes   Smokeless tobacco: Never  Substance and Sexual Activity   Alcohol use: No   Drug use: No   Sexual activity: Not on file  Other Topics Concern   Not on file  Social History Narrative   Not on file   Social Determinants of Health   Financial Resource Strain: Not on file  Food Insecurity: Not on file  Transportation Needs: Not on file  Physical Activity: Not on file  Stress: Not on file  Social Connections: Not on file  Intimate Partner Violence: Not on file    Review of Systems: Gen: Denies any fever, chills, fatigue, weight loss, lack of appetite.  CV: Denies chest pain, heart palpitations, peripheral edema, syncope.  Resp: Denies shortness of breath at rest or with exertion.  Denies wheezing or cough.  GI: see HPI GU : Denies urinary burning, urinary frequency, urinary hesitancy MS: Denies joint pain, muscle weakness, cramps, or limitation of movement.  Derm: Denies rash, itching, dry skin Psych: Denies depression, anxiety, memory loss, and confusion Heme: Denies bruising, bleeding, and enlarged lymph nodes.  Physical Exam: BP (!) 141/89    Pulse 74    Temp (!) 97.1 F (36.2 C)    Ht 6' (1.829 m)    Wt (!) 325 lb 9.6 oz (147.7 kg)    BMI 44.16 kg/m  General:   Alert and oriented. Pleasant and cooperative. Well-nourished and well-developed.  Head:  Normocephalic and atraumatic. Eyes:  Without icterus, sclera clear and conjunctiva pink.  Ears:  Normal auditory acuity. Mouth:  mask in place Lungs:  scattered expiratory wheezes bilaterally Heart:  S1, S2 present without murmurs appreciated.  Abdomen:  +BS, soft, obese, non-tender and non-distended. No HSM noted. No guarding or rebound. No masses appreciated.  Rectal:  declined  Msk:  Symmetrical without gross deformities. Normal posture. Extremities:  Without edema. Neurologic:  Alert and  oriented x4;  grossly normal neurologically. Skin:  Intact without significant lesions or rashes. Psych:  Alert and cooperative. Normal mood and affect.  ASSESSMENT: Vincent Griffin is a 61 y.o. male presenting today with history of several episodes of painless low-volume rectal bleeding in December but has stopped since that time. No prior colonoscopy. He has no family history of colorectal cancer but does state brother has history of colon polyps.   History pertinent for NSTEMI in March 2022, s/p cardiac cath with stent placement. He is currently on aspirin and Plavix. Was seen 05/31/20 by Dr. Juleen Starr (cardiology with [facility]). Recommendations to continue dual antiplatelet therapy and at 1 year stent anniversary can stop clopidogrel and stay on monotherapy aspirin. Per cardiology, he is "acceptable for a colonoscopy" and  recommend waiting to complete the 1 year of dual antiplatelet therapy prior to colonoscopy.   We will hold off until the 1 year anniversary to schedule unless he has outright persistent rectal bleeding, anemia, etc., that would require intervention. We will reach out to cardiologist in March 2023 to ensure appropriate to hold Plavix.      PLAN: Proceed with colonoscopy by Dr. Marletta Lor  after one year following cardiac stent (after 08/12/21): the risks, benefits, and alternatives have been discussed with the patient in detail. The patient states understanding and desires to proceed.   Will reach out to cardiology when closer to that time to hold Plavix X 5 days prior  Call if any further rectal bleeding  Gelene Mink, PhD, ANP-BC [facility]

## 2021-06-01 NOTE — ED Triage Notes (Signed)
Pt comes via [facility] for CP that started at 730pm, central, with some SOB, PTA pt took one nitro and 324 ASA with relief

## 2021-06-01 NOTE — ED Provider Notes (Signed)
[facility] EMERGENCY DEPARTMENT Provider Note   CSN: [id number] Arrival date & time: 06/01/21  2047     History  Chief Complaint  Patient presents with   Chest Pain    Vincent Griffin is a 61 y.o. male.   Chest Pain Associated symptoms: shortness of breath   Associated symptoms: no abdominal pain, no back pain, no cough, no dizziness, no fatigue, no fever, no headache, no nausea, no numbness, no palpitations, no vomiting and no weakness   Patient presents for chest pain.  Medical history is notable for CAD, CKD, HLD, tobacco use, and obesity.  He had an NSTEMI in March of last year.  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  A drug-eluting stent was placed.  Subsequent echocardiogram showed normal LVEF.  Home medications include aspirin, Plavix, metoprolol and Lipitor.  He reports good compliance with his home medications.  He was seen by cardiology yesterday during what was a standard follow-up.  This evening, while smoking a cigarette on his porch, he experienced substernal chest pain.  He describes it as feeling like he was hit by a baseball in the chest.  Chest pain subsequently resolved without any medications.  He did receive 324 of ASA by EMS.  At time of his chest pain, he had mild shortness of breath but he did not have any other associated symptoms.  Currently, he denies any current symptoms.  Home Medications Prior to Admission medications   Medication Sig Start Date End Date Taking? Authorizing Provider  nicotine polacrilex (NICORETTE) 4 MG gum Take by mouth. 05/31/21  Yes [provider]  aspirin 81 MG chewable tablet CHEW AND SWALLOW 1 TABLET (81 MG) BY MOUTH DAILY 08/13/20   [provider]  atorvastatin (LIPITOR) 80 MG tablet Take 80 mg by mouth at bedtime. 12/09/20   [provider]  clopidogrel (PLAVIX) 75 MG tablet Take 75 mg by mouth daily. 12/09/20   [provider]  metoprolol succinate  (TOPROL-XL) 25 MG 24 hr tablet Take 25 mg by mouth daily. 12/10/20   [provider]  Multiple Vitamin (MULTIVITAMIN) tablet Take 1 tablet by mouth daily.    [provider]  nitroGLYCERIN (NITROSTAT) 0.4 MG SL tablet Place under the tongue. 08/13/20   [provider]  tiZANidine (ZANAFLEX) 4 MG tablet Take 4 mg by mouth every 6 (six) hours as needed for muscle spasms.    [provider]      Allergies    Patient has no known allergies.    Review of Systems   Review of Systems  Constitutional:  Negative for activity change, appetite change, chills, fatigue and fever.  HENT:  Negative for congestion, ear pain and sore throat.   Eyes:  Negative for pain and visual disturbance.  Respiratory:  Positive for shortness of breath. Negative for cough, chest tightness, wheezing and stridor.   Cardiovascular:  Positive for chest pain. Negative for palpitations and leg swelling.  Gastrointestinal:  Negative for abdominal pain, diarrhea, nausea and vomiting.  Genitourinary:  Negative for dysuria, flank pain and hematuria.  Musculoskeletal:  Negative for arthralgias, back pain, joint swelling, myalgias and neck pain.  Skin:  Negative for color change and rash.  Neurological:  Negative for dizziness, seizures, syncope, weakness, Shirk-headedness, numbness and headaches.  Hematological:  Does not bruise/bleed easily.  All other systems reviewed and are negative.  Physical Exam Updated Vital Signs BP 94/65    Pulse 68  Temp 98 F (36.7 C) (Oral)    Resp (!) 23    Ht 5\' 9"  (1.753 m)    Wt (!) 147.4 kg    SpO2 92%    BMI 47.99 kg/m  Physical Exam Vitals and nursing note reviewed.  Constitutional:      General: He is not in acute distress.    Appearance: He is well-developed. He is not ill-appearing, toxic-appearing or diaphoretic.  HENT:     Head: Normocephalic and atraumatic.  Eyes:     Conjunctiva/sclera: Conjunctivae normal.  Neck:     Vascular: No JVD.   Cardiovascular:     Rate and Rhythm: Normal rate and regular rhythm.     Heart sounds: No murmur heard. Pulmonary:     Effort: Pulmonary effort is normal. No respiratory distress.     Breath sounds: Normal breath sounds. No decreased breath sounds, wheezing or rales.  Chest:     Chest wall: No tenderness or edema.  Abdominal:     Palpations: Abdomen is soft.     Tenderness: There is no abdominal tenderness.  Musculoskeletal:        General: No swelling.     Cervical back: Normal range of motion and neck supple.     Right lower leg: No edema.     Left lower leg: No edema.  Skin:    General: Skin is warm and dry.     Capillary Refill: Capillary refill takes less than 2 seconds.  Neurological:     General: No focal deficit present.     Mental Status: He is alert and oriented to person, place, and time.     Cranial Nerves: No cranial nerve deficit.     Motor: No weakness.  Psychiatric:        Mood and Affect: Mood normal.        Behavior: Behavior normal.    ED Results / Procedures / Treatments   Labs (all labs ordered are listed, but only abnormal results are displayed) Labs Reviewed  COMPREHENSIVE METABOLIC PANEL - Abnormal; Notable for the following components:      Result Value   Calcium 8.5 (*)    All other components within normal limits  CBC WITH DIFFERENTIAL/PLATELET - Abnormal; Notable for the following components:   MCV 102.5 (*)    All other components within normal limits  MAGNESIUM  PROTIME-INR  TROPONIN I (HIGH SENSITIVITY)  TROPONIN I (HIGH SENSITIVITY)    EKG EKG Interpretation  Date/Time:  Tuesday June 01 2021 20:55:24 EST Ventricular Rate:  68 PR Interval:  169 QRS Duration: 118 QT Interval:  416 QTC Calculation: 443 R Axis:   50 Text Interpretation: Sinus rhythm Incomplete right bundle branch block Low voltage, precordial leads Confirmed by Godfrey Pick [phone]) on 06/01/2021 9:29:49 PM  Radiology DG Chest Portable 1 View  Result Date:  06/01/2021 CLINICAL DATA:  Chest pain. EXAM: PORTABLE CHEST 1 VIEW COMPARISON:  Chest x-ray 07/18/2013. FINDINGS: The cardiomediastinal silhouette is within normal limits. There is some scattered reticular opacities throughout both lungs. There is no lung consolidation, pleural effusion or pneumothorax identified. No acute fractures are seen. IMPRESSION: 1. Scattered reticular opacities throughout both lungs, nonspecific. Findings may related to scarring or mild interstitial edema. Electronically Signed   By: Ronney Asters M.D.   On: 06/01/2021 22:16    Procedures Procedures    Medications Ordered in ED Medications - No data to display  ED Course/ Medical Decision Making/ A&P  Medical Decision Making Amount and/or Complexity of Data Reviewed Labs: ordered. Radiology: ordered.   This patient presents to the ED for concern of chest pain, this involves an extensive number of treatment options, and is a complaint that carries with it a high risk of complications and morbidity.  The differential diagnosis includes ACS, aortic dissection, pneumothorax, pulmonary embolism, PUD, GERD, costochondritis   Co morbidities that complicate the patient evaluation  CAD, CKD, HLD   Additional history obtained:  Additional history obtained from N/A External records from outside source obtained and reviewed including EMR   Lab Tests:  I Ordered, and personally interpreted labs.  The pertinent results include: Normal electrolytes, no anemia, no leukocytosis, normal troponins   Imaging Studies ordered:  I ordered imaging studies including chest x-ray I independently visualized and interpreted imaging which showed nonspecific scattered reticular opacities I agree with the radiologist interpretation   Cardiac Monitoring:  The patient was maintained on a cardiac monitor.  I personally viewed and interpreted the cardiac monitored which showed an underlying rhythm of:  Sinus rhythm   Medicines ordered and prescription drug management:  Patient received 324 of ASA prior to arrival and was asymptomatic throughout his stay in the ED. Reevaluation of the patient showed that the patient stayed the same I have reviewed the patients home medicines and have made adjustments as needed  Problem List / ED Course:  61 year old male with history of CAD s/p stent last March, presents for acute onset of chest pain this evening.  At the time, he was at rest, on his porch smoking a cigarette.  Chest pain resolved without intervention.  He did receive 3-4 of ASA prior to arrival.  On initial assessment, patient is asymptomatic.  He is well-appearing with normal vital signs.  Work-up was initiated.  EKG showed no evidence of ischemia.  Lab work, including troponins x2 were normal.  Patient remained asymptomatic throughout his period of observation in the ED.  Patient does have good access to outpatient care, including cardiology care.  He sees cardiology through [facility] and did have a standard follow-up appointment yesterday.  He states that he will be able to contact his cardiologist again tomorrow to inform him of the symptoms he experienced tonight and to discuss possible further testing.  Given his reassuring work-up and absence of symptoms in the ED, I do feel he is appropriate for discharge at this time.  Patient would benefit from close cardiology follow-up.  He was encouraged to return to the ED if he has any recurrence of symptoms.   Reevaluation:  After the interventions noted above, I reevaluated the patient and found that they have :resolved   Social Determinants of Health:  Patient has good access to outpatient care, including cardiology care   Dispostion:  After consideration of the diagnostic results and the patients response to treatment, I feel that the patent would benefit from discharge with close follow-up.          Final Clinical Impression(s) /  ED Diagnoses Final diagnoses:  Chest pain, unspecified type    Rx / DC Orders ED Discharge Orders     None         Godfrey Pick, MD 06/03/21 1839

## 2021-06-02 ENCOUNTER — Telehealth: Payer: Self-pay

## 2021-06-02 LAB — TROPONIN I (HIGH SENSITIVITY): Troponin I (High Sensitivity): 5 ng/L (ref <18)

## 2021-06-02 NOTE — Discharge Instructions (Addendum)
Follow-up with your cardiologist soon as possible.  If you experience any return of symptoms, please return to emergency department.

## 2021-06-02 NOTE — Telephone Encounter (Signed)
Faxed Medical Clearance / Medicine to Dr. Mady Haagensen. Means for the pt to have a colonoscopy with Dr. Earnest Bailey (08/12/21 for colonoscopy)

## 2021-06-03 DIAGNOSIS — E785 Hyperlipidemia, unspecified: Secondary | ICD-10-CM | POA: Diagnosis not present

## 2021-06-03 DIAGNOSIS — I2510 Atherosclerotic heart disease of native coronary artery without angina pectoris: Secondary | ICD-10-CM | POA: Diagnosis not present

## 2021-06-03 DIAGNOSIS — I10 Essential (primary) hypertension: Secondary | ICD-10-CM | POA: Diagnosis not present

## 2021-06-08 ENCOUNTER — Other Ambulatory Visit: Payer: Self-pay | Admitting: Family Medicine

## 2021-06-08 ENCOUNTER — Other Ambulatory Visit: Payer: Self-pay | Admitting: Family

## 2021-06-08 DIAGNOSIS — M545 Low back pain, unspecified: Secondary | ICD-10-CM

## 2021-06-16 ENCOUNTER — Telehealth: Payer: Self-pay

## 2021-06-16 NOTE — Telephone Encounter (Signed)
Noted, thanks!

## 2021-06-16 NOTE — Telephone Encounter (Signed)
Dena, I am not sure what you are referring to :)   But, yes, I saw him in January and we discussed that we can't pursue colonoscopy until after March 2023. He had just seen cardiology and their note was reviewed at time of visit. We don't need to reach out until March 2023 to ensure it is still appropriate to hold Plavix, as it will be one year he has been on dual antiplatelet therapy.

## 2021-06-16 NOTE — Telephone Encounter (Signed)
Read the pt's ov note from 06-03-2021 and it is stating that "will wait until one year of DAPT therapy is complete regarding the pt's colonoscopy.  Tobi Bastos you had ordered this for Car[facility]'s pt.

## 2021-06-24 ENCOUNTER — Ambulatory Visit: Payer: BC Managed Care – PPO | Admitting: Nurse Practitioner

## 2021-06-24 ENCOUNTER — Encounter: Payer: Self-pay | Admitting: Nurse Practitioner

## 2021-06-24 VITALS — BP 117/78 | HR 76 | Temp 97.7°F | Resp 20 | Ht 69.0 in | Wt 321.0 lb

## 2021-06-24 DIAGNOSIS — M545 Low back pain, unspecified: Secondary | ICD-10-CM | POA: Diagnosis not present

## 2021-06-24 MED ORDER — PREDNISONE 10 MG (21) PO TBPK: ORAL_TABLET | ORAL | 0 refills | Status: DC

## 2021-06-24 MED ORDER — CYCLOBENZAPRINE HCL 10 MG PO TABS: 10.0000 mg | ORAL_TABLET | Freq: Three times a day (TID) | ORAL | 1 refills | Status: DC | PRN

## 2021-06-24 NOTE — Patient Instructions (Signed)

## 2021-06-24 NOTE — Progress Notes (Signed)
Subjective:    Patient ID: Vincent Griffin, male    DOB: 1960/09/14, 61 y.o.   MRN: [id number]   Chief Complaint: Back Pain (Low back/)   Back Pain This is a recurrent problem. The current episode started yesterday. The problem occurs constantly. The problem has been waxing and waning since onset. The pain is present in the lumbar spine. The quality of the pain is described as stabbing. The pain does not radiate. The pain is at a severity of 8/10. The pain is moderate. The pain is Worse during the day. The symptoms are aggravated by standing (walking). Pertinent negatives include no abdominal pain, chest pain, headaches or weakness. He has tried nothing for the symptoms.      Review of Systems  Constitutional:  Negative for diaphoresis.  Eyes:  Negative for pain.  Respiratory:  Negative for shortness of breath.   Cardiovascular:  Negative for chest pain, palpitations and leg swelling.  Gastrointestinal:  Negative for abdominal pain.  Endocrine: Negative for polydipsia.  Musculoskeletal:  Positive for back pain.  Skin:  Negative for rash.  Neurological:  Negative for dizziness, weakness and headaches.  Hematological:  Does not bruise/bleed easily.  All other systems reviewed and are negative.     Objective:   Physical Exam Vitals and nursing note reviewed.  Constitutional:      Appearance: Normal appearance. He is well-developed.  Neck:     Thyroid: No thyroid mass or thyromegaly.     Vascular: No carotid bruit or JVD.     Trachea: Phonation normal.  Cardiovascular:     Rate and Rhythm: Normal rate and regular rhythm.  Pulmonary:     Effort: Pulmonary effort is normal. No respiratory distress.     Breath sounds: Normal breath sounds.  Abdominal:     General: Bowel sounds are normal.     Palpations: Abdomen is soft.     Tenderness: There is no abdominal tenderness.  Musculoskeletal:        General: Normal range of motion.     Cervical back: Normal range of motion and neck  supple.     Comments: rises slowly from sitting to standing Motor strength and sensation distally intact (-) SLR bil  Lymphadenopathy:     Cervical: No cervical adenopathy.  Skin:    General: Skin is warm and dry.  Neurological:     General: No focal deficit present.     Mental Status: He is alert and oriented to person, place, and time.     Cranial Nerves: No cranial nerve deficit.     Sensory: No sensory deficit.  Psychiatric:        Behavior: Behavior normal.        Thought Content: Thought content normal.        Judgment: Judgment normal.   BP 117/78    Pulse 76    Temp 97.7 F (36.5 C) (Temporal)    Resp 20    Ht 5\' 9"  (1.753 m)    Wt (!) 321 lb (145.6 kg)    SpO2 95%    BMI 47.40 kg/m         Assessment & Plan:   Vincent Griffin in today with chief complaint of Back Pain (Low back/)   1. Acute left-sided low back pain without sciatica moist heat Rest No heavy lifting' no bending or stooping - predniSONE (STERAPRED UNI-PAK 21 TAB) 10 MG (21) TBPK tablet; As directed x 6 days  Dispense: 21 tablet; Refill:  0 - cyclobenzaprine (FLEXERIL) 10 MG tablet; Take 1 tablet (10 mg total) by mouth 3 (three) times daily as needed for muscle spasms.  Dispense: 30 tablet; Refill: 1    The above assessment and management plan was discussed with the patient. The patient verbalized understanding of and has agreed to the management plan. Patient is aware to call the clinic if symptoms persist or worsen. Patient is aware when to return to the clinic for a follow-up visit. Patient educated on when it is appropriate to go to the emergency department.   Mary-Margaret Hassell Done, FNP

## 2021-07-08 ENCOUNTER — Ambulatory Visit (INDEPENDENT_AMBULATORY_CARE_PROVIDER_SITE_OTHER): Payer: BC Managed Care – PPO | Admitting: Family

## 2021-07-08 ENCOUNTER — Encounter: Payer: Self-pay | Admitting: Family

## 2021-07-08 DIAGNOSIS — L03116 Cellulitis of left lower limb: Secondary | ICD-10-CM | POA: Diagnosis not present

## 2021-07-08 DIAGNOSIS — M7989 Other specified soft tissue disorders: Secondary | ICD-10-CM | POA: Diagnosis not present

## 2021-07-08 MED ORDER — SULFAMETHOXAZOLE-TRIMETHOPRIM 800-160 MG PO TABS: 1.0000 | ORAL_TABLET | Freq: Two times a day (BID) | ORAL | 0 refills | Status: DC

## 2021-07-08 NOTE — Progress Notes (Signed)
Virtual Visit  Note Due to COVID-19 pandemic this visit was conducted virtually. This visit type was conducted due to national recommendations for restrictions regarding the COVID-19 Pandemic (e.g. social distancing, sheltering in place) in an effort to limit this patient's exposure and mitigate transmission in our community. All issues noted in this document were discussed and addressed.  A physical exam was not performed with this format.  I connected with Vincent Griffin on 07/08/21 at 3:03 pm  by telephone and verified that I am speaking with the correct person using two identifiers. Vincent Griffin is currently located at shop and his friend is currently with him during visit. The provider, [name], FNP is located in their office at time of visit.  I discussed the limitations, risks, security and privacy concerns of performing an evaluation and management service by telephone and the availability of in person appointments. I also discussed with the patient that there may be a patient responsible charge related to this service. The patient expressed understanding and agreed to proceed.  Vincent Griffin are scheduled for a virtual visit with your provider today.    Just as we do with appointments in the office, we must obtain your consent to participate.  Your consent will be active for this visit and any virtual visit you may have with one of our providers in the next 365 days.    If you have a MyChart account, I can also send a copy of this consent to you electronically.  All virtual visits are billed to your insurance company just like a traditional visit in the office.  As this is a virtual visit, video technology does not allow for your provider to perform a traditional examination.  This may limit your provider's ability to fully assess your condition.  If your provider identifies any concerns that need to be evaluated in person or the need to arrange testing such as labs, EKG, etc, we will  make arrangements to do so.    Although advances in technology are sophisticated, we cannot ensure that it will always work on either your end or our end.  If the connection with a video visit is poor, we may have to switch to a telephone visit.  With either a video or telephone visit, we are not always able to ensure that we have a secure connection.   I need to obtain your verbal consent now.   Are you willing to proceed with your visit today?   Vincent Griffin has provided verbal consent on 07/08/2021 for a virtual visit (video or telephone).   [name], [place] 07/08/2021  3:05 PM    History and Present Illness:  HPI Pt calls the office today with right leg swelling and redness that 2-3 days ago. He had a doppler today that was negative for DVT.   He reports swelling, redness, mild tenderness, and warmth. States it looks better today, but states he has not been standing and walking on concrete today.   Denies any SOB or chest pain.   Review of Systems  All other systems reviewed and are negative.   Observations/Objective: No SOB or distress noted   Assessment and Plan: 1. Cellulitis of left lower leg Start Bactrim  Keep elevated  Report any fevers increased redness or swelling Go to ED with any SOB or chest pain Follow up if symptoms are not improve or worsening  - sulfamethoxazole-trimethoprim (BACTRIM DS) 800-160 MG tablet; Take 1 tablet by mouth 2 (  two) times daily.  Dispense: 14 tablet; Refill: 0    I discussed the assessment and treatment plan with the patient. The patient was provided an opportunity to ask questions and all were answered. The patient agreed with the plan and demonstrated an understanding of the instructions.   The patient was advised to call back or seek an in-person evaluation if the symptoms worsen or if the condition fails to improve as anticipated.  The above assessment and management plan was discussed with the patient. The patient verbalized  understanding of and has agreed to the management plan. Patient is aware to call the clinic if symptoms persist or worsen. Patient is aware when to return to the clinic for a follow-up visit. Patient educated on when it is appropriate to go to the emergency department.   Time call ended:  3:15 pm   I provided 12 minutes of  non face-to-face time during this encounter.    [name], FNP

## 2021-07-08 NOTE — Patient Instructions (Signed)

## 2021-07-09 ENCOUNTER — Telehealth: Payer: BC Managed Care – PPO | Admitting: Family

## 2021-07-29 NOTE — Telephone Encounter (Signed)
Dena, please reach out to cardiology to see if pt can hold Plavix before having TCS w/Dr. Marletta Lor. We are now scheduling for April and pt needs to have TCS scheduled after 08/12/21. ?

## 2021-07-30 NOTE — Telephone Encounter (Signed)
Re sent clearance to Deatra Robinson regarding pt being cleared to hold his Plavix before his colonoscopy.  ?

## 2021-08-30 NOTE — Telephone Encounter (Signed)
FYI: ? ?Call from Annica @ [facility] stating she called the pt to schedule for a appt to be seen (pt hasn't been there in awhile). She left a message for him to return her call. ?

## 2021-08-30 NOTE — Telephone Encounter (Signed)
Phoned to [facility] spoke with the receptionist and she spoke with with nurse in regards to this pt's cardiac clearance and I was asked to re-send it again.was faxed to [phone] and [phone]. Will also send again to [facility] pre-op ?

## 2021-08-30 NOTE — Progress Notes (Signed)
Vincent Griffin (DOB 1960-06-05) ?[address]  ?[place] [address] ?  ?  ?Attention: Preop: ?  ?  ?We would like to request holding the following medication for patient please. ?  ?  ?Procedure: Colonoscopy ASA lll ?  ?  ?Date: TBD---to be scheduled after 08/12/2021 ?  ?  ?Medication to hold: Plavix ?  ?  ?Surgeon: Dr. Earnest Bailey ?  ?  ?Phone: [phone] ?  ?  ?Fax:  [phone] ?  ?  ?Type of Anesthesia: Propofol  ?

## 2021-09-20 ENCOUNTER — Telehealth: Payer: Self-pay

## 2021-09-20 NOTE — Telephone Encounter (Signed)
error 

## 2021-09-23 NOTE — Telephone Encounter (Signed)
Faxed the pt's Cardiac Clearance again to a different number @ [phone]. Pt Dr had stated they are trying to get in contact with the pt for a ov then they will contact us. ?

## 2021-10-01 ENCOUNTER — Other Ambulatory Visit: Payer: Self-pay | Admitting: Family Medicine

## 2021-10-01 ENCOUNTER — Other Ambulatory Visit: Payer: Self-pay | Admitting: Family

## 2021-10-01 DIAGNOSIS — M545 Low back pain, unspecified: Secondary | ICD-10-CM

## 2021-10-12 ENCOUNTER — Telehealth: Payer: Self-pay

## 2021-10-12 NOTE — Telephone Encounter (Signed)
I have been trying to get Cardiac clearance on this pt since January. Pt continues to cancel his appt with his PCP. I made several attempts even until this morning. I spoke with the nurse Albin Felling and she had stated the pt doesn't have anything scheduled with them and he cancels the one's they make. So if its ok I will just put this on the back burner for now.

## 2021-10-20 NOTE — Telephone Encounter (Signed)
I checked and the pt still does not have any upcoming appts with PCP. At this rate I don't think the pt wants a colonoscopy.

## 2021-12-15 ENCOUNTER — Telehealth: Payer: Self-pay

## 2021-12-15 NOTE — Chronic Care Management (AMB) (Signed)
  Care Coordination  Outreach Note  12/15/2021 Name: JAQUA CHING MRN: [id number] DOB: 18-May-1960   Care Coordination Outreach Attempts  An unsuccessful telephone outreach was attempted today to offer the patient information about available care coordination services as a benefit of their health plan.   Follow Up Plan:  Additional outreach attempts will be made to offer the patient care coordination information and services.   Encounter Outcome:  No Answer  Penne Lash, RMA Care Guide [facility]  [place], [place] [address] Direct Dial: [phone] [email or web address]

## 2022-01-03 ENCOUNTER — Telehealth: Payer: Self-pay | Admitting: *Deleted

## 2022-01-03 ENCOUNTER — Telehealth: Payer: Self-pay | Admitting: Family

## 2022-01-03 NOTE — Telephone Encounter (Signed)
Received VM from pt wanting to schedule colonoscopy. Pt not seen since 05/2021. Needs OV.  Called pt, LMOVM advising to call back to schedule OV. FYI to [employer]

## 2022-01-03 NOTE — Telephone Encounter (Signed)
Patient aware to call GI office to set up

## 2022-01-05 NOTE — Chronic Care Management (AMB) (Signed)
  Care Management   Outreach Note  01/05/2022 Name: Vincent Griffin MRN: [id number] DOB: January 24, 1961  A second unsuccessful telephone outreach was attempted today. The patient was referred to the case management team for assistance with care management and care coordination.   Follow Up Plan:  A HIPAA compliant phone message was left for the patient providing contact information and requesting a return call.  The care management team will reach out to the patient again over the next 7 days.  If patient returns call to provider office, please advise to call Care Management Care Guide Penne Lash * at [phone]*  Penne Lash, RMA Care Guide [facility]  [place], [place] [address] Direct Dial: [phone] [email or web address]

## 2022-01-18 NOTE — Chronic Care Management (AMB) (Signed)
  Care Coordination  Outreach Note  01/18/2022 Name: Vincent Griffin MRN: [id number] DOB: 02/26/1961   Care Coordination Outreach Attempts  A third unsuccessful outreach was attempted today to offer the patient with information about available care coordination services as a benefit of their health plan.   Follow Up Plan:  No further outreach attempts will be made at this time. We have been unable to contact the patient to offer or enroll patient in care coordination services  Encounter Outcome:  No Answer  Sig Penne Lash, RMA Care Guide [facility]  [place], [place] [address] Direct Dial: [phone] [name]

## 2022-03-07 ENCOUNTER — Encounter: Payer: Self-pay | Admitting: Nurse Practitioner

## 2022-03-07 ENCOUNTER — Telehealth (INDEPENDENT_AMBULATORY_CARE_PROVIDER_SITE_OTHER): Payer: BC Managed Care – PPO | Admitting: Nurse Practitioner

## 2022-03-07 DIAGNOSIS — J069 Acute upper respiratory infection, unspecified: Secondary | ICD-10-CM | POA: Diagnosis not present

## 2022-03-07 MED ORDER — FLUTICASONE PROPIONATE 50 MCG/ACT NA SUSP: 2.0000 | Freq: Every day | NASAL | 6 refills | Status: DC

## 2022-03-07 MED ORDER — GUAIFENESIN ER 600 MG PO TB12
600.0000 mg | ORAL_TABLET | Freq: Two times a day (BID) | ORAL | 0 refills | Status: DC
Start: 2022-03-07 — End: 2022-06-22

## 2022-03-07 MED ORDER — AZITHROMYCIN 250 MG PO TABS: ORAL_TABLET | ORAL | 0 refills | Status: AC

## 2022-03-07 NOTE — Progress Notes (Signed)
   Virtual Visit  Note Due to COVID-19 pandemic this visit was conducted virtually. This visit type was conducted due to national recommendations for restrictions regarding the COVID-19 Pandemic (e.g. social distancing, sheltering in place) in an effort to limit this patient's exposure and mitigate transmission in our community. All issues noted in this document were discussed and addressed.  A physical exam was not performed with this format.  I connected with Vincent Griffin on 03/07/22 at 12:45 PM by telephone and verified that I am speaking with the correct person using two identifiers. Vincent Griffin is currently located at home during visit. The provider, Ivy Lynn, NP is located in their office at time of visit.  I discussed the limitations, risks, security and privacy concerns of performing an evaluation and management service by telephone and the availability of in person appointments. I also discussed with the patient that there may be a patient responsible charge related to this service. The patient expressed understanding and agreed to proceed.   History and Present Illness:  Sinusitis This is a new problem. Episode onset: in the past 2-4 dasy. The problem has been gradually worsening since onset. There has been no fever. Associated symptoms include chills, congestion, coughing, headaches and sinus pressure. Pertinent negatives include no sore throat or swollen glands. Treatments tried: flu and sinus OTC. The treatment provided mild relief.      Review of Systems  Constitutional:  Positive for chills. Negative for fever and malaise/fatigue.  HENT:  Positive for congestion and sinus pressure. Negative for sore throat.   Respiratory:  Positive for cough.   Cardiovascular: Negative.   Genitourinary: Negative.   Skin: Negative.   Neurological:  Positive for headaches.  All other systems reviewed and are negative.    Observations/Objective: Televisit patient not in  distress.  Assessment and Plan: Patient presents with symptoms on upper respiratory infection, cough, congestion and chills. At home COVID-19 test: negative, patient is unable to come to clinic for flu/COVID swab at this time.  OTC flu and cold medication are non therapeutic Take meds as prescribed - Use a cool mist humidifier  -Use saline nose sprays frequently -Force fluids -For fever or aches or pains- take Tylenol or ibuprofen. -If symptoms do not improve, she may need to be COVID tested to rule this out   Follow Up Instructions: Follow up with worsening unresolved symptoms     I discussed the assessment and treatment plan with the patient. The patient was provided an opportunity to ask questions and all were answered. The patient agreed with the plan and demonstrated an understanding of the instructions.   The patient was advised to call back or seek an in-person evaluation if the symptoms worsen or if the condition fails to improve as anticipated.  The above assessment and management plan was discussed with the patient. The patient verbalized understanding of and has agreed to the management plan. Patient is aware to call the clinic if symptoms persist or worsen. Patient is aware when to return to the clinic for a follow-up visit. Patient educated on when it is appropriate to go to the emergency department.   Time call ended:  12:56  I provided 11 minutes of  non face-to-face time during this encounter.    Ivy Lynn, NP

## 2022-05-06 ENCOUNTER — Encounter: Payer: Self-pay | Admitting: Family Medicine

## 2022-05-06 ENCOUNTER — Ambulatory Visit (INDEPENDENT_AMBULATORY_CARE_PROVIDER_SITE_OTHER): Payer: BC Managed Care – PPO | Admitting: Family Medicine

## 2022-05-06 DIAGNOSIS — J0140 Acute pansinusitis, unspecified: Secondary | ICD-10-CM

## 2022-05-06 MED ORDER — AMOXICILLIN-POT CLAVULANATE 875-125 MG PO TABS: 1.0000 | ORAL_TABLET | Freq: Two times a day (BID) | ORAL | 0 refills | Status: AC

## 2022-05-06 NOTE — Progress Notes (Signed)
   Virtual Visit  Note Due to COVID-19 pandemic this visit was conducted virtually. This visit type was conducted due to national recommendations for restrictions regarding the COVID-19 Pandemic (e.g. social distancing, sheltering in place) in an effort to limit this patient's exposure and mitigate transmission in our community. All issues noted in this document were discussed and addressed.  A physical exam was not performed with this format.  I connected with Vincent Griffin on 05/06/22 at 0807 by telephone and verified that I am speaking with the correct person using two identifiers. Vincent Griffin is currently located at home and no one is currently with her during visit. The provider, Gabriel Earing, FNP is located in their office at time of visit.  I discussed the limitations, risks, security and privacy concerns of performing an evaluation and management service by telephone and the availability of in person appointments. I also discussed with the patient that there may be a patient responsible charge related to this service. The patient expressed understanding and agreed to proceed.  CC: nasal congestion  History and Present Illness:  HPI Vincent Griffin reports nasal congestion for 1 week. He reports that this seemed to be getting better until yesterday when he started having facial pain around his cheeks and eyes. He also reports a cough. He denies fever, chills, shortness of breath, chest pain, ear pain, sore throat, nausea, vomiting, or diarrhea. He has been taking OTC cold medication without improvement. He is a smoker.    ROS As per HPI.   Observations/Objective: Alert and oriented x 3. Able to speak in full sentences without difficulty.    Assessment and Plan: Vincent Griffin was seen today for nasal congestion.  Diagnoses and all orders for this visit:  Acute non-recurrent pansinusitis Augmentin as below. Discussed symptomatic care and return precautions.  -     amoxicillin-clavulanate  (AUGMENTIN) 875-125 MG tablet; Take 1 tablet by mouth 2 (two) times daily for 7 days.     Follow Up Instructions: As needed.     I discussed the assessment and treatment plan with the patient. The patient was provided an opportunity to ask questions and all were answered. The patient agreed with the plan and demonstrated an understanding of the instructions.   The patient was advised to call back or seek an in-person evaluation if the symptoms worsen or if the condition fails to improve as anticipated.  The above assessment and management plan was discussed with the patient. The patient verbalized understanding of and has agreed to the management plan. Patient is aware to call the clinic if symptoms persist or worsen. Patient is aware when to return to the clinic for a follow-up visit. Patient educated on when it is appropriate to go to the emergency department.   Time call ended:  0818  I provided 11 minutes of  non face-to-face time during this encounter.    Gabriel Earing, FNP

## 2022-06-03 DIAGNOSIS — I2510 Atherosclerotic heart disease of native coronary artery without angina pectoris: Secondary | ICD-10-CM | POA: Diagnosis not present

## 2022-06-03 DIAGNOSIS — E785 Hyperlipidemia, unspecified: Secondary | ICD-10-CM | POA: Diagnosis not present

## 2022-06-03 DIAGNOSIS — I214 Non-ST elevation (NSTEMI) myocardial infarction: Secondary | ICD-10-CM | POA: Diagnosis not present

## 2022-06-14 DIAGNOSIS — E785 Hyperlipidemia, unspecified: Secondary | ICD-10-CM | POA: Diagnosis not present

## 2022-06-14 DIAGNOSIS — I2510 Atherosclerotic heart disease of native coronary artery without angina pectoris: Secondary | ICD-10-CM | POA: Diagnosis not present

## 2022-06-14 DIAGNOSIS — I214 Non-ST elevation (NSTEMI) myocardial infarction: Secondary | ICD-10-CM | POA: Diagnosis not present

## 2022-06-22 ENCOUNTER — Ambulatory Visit (INDEPENDENT_AMBULATORY_CARE_PROVIDER_SITE_OTHER): Payer: BC Managed Care – PPO | Admitting: Family Medicine

## 2022-06-22 ENCOUNTER — Encounter: Payer: Self-pay | Admitting: Family Medicine

## 2022-06-22 ENCOUNTER — Encounter: Payer: Self-pay | Admitting: Family

## 2022-06-22 VITALS — BP 118/76 | HR 73 | Temp 96.7°F | Ht 69.0 in | Wt 303.8 lb

## 2022-06-22 DIAGNOSIS — Z1211 Encounter for screening for malignant neoplasm of colon: Secondary | ICD-10-CM | POA: Diagnosis not present

## 2022-06-22 DIAGNOSIS — Z1212 Encounter for screening for malignant neoplasm of rectum: Secondary | ICD-10-CM

## 2022-06-22 DIAGNOSIS — R197 Diarrhea, unspecified: Secondary | ICD-10-CM

## 2022-06-22 DIAGNOSIS — R1110 Vomiting, unspecified: Secondary | ICD-10-CM | POA: Diagnosis not present

## 2022-06-22 DIAGNOSIS — K529 Noninfective gastroenteritis and colitis, unspecified: Secondary | ICD-10-CM | POA: Diagnosis not present

## 2022-06-22 DIAGNOSIS — R112 Nausea with vomiting, unspecified: Secondary | ICD-10-CM

## 2022-06-22 LAB — VERITOR FLU A/B WAIVED
Influenza A: NEGATIVE
Influenza B: NEGATIVE

## 2022-06-22 MED ORDER — ONDANSETRON HCL 4 MG PO TABS: 4.0000 mg | ORAL_TABLET | Freq: Three times a day (TID) | ORAL | 0 refills | Status: DC | PRN

## 2022-06-22 NOTE — Progress Notes (Signed)
Subjective:  Patient ID: Vincent Griffin, male    DOB: [birth year], 62 y.o.   MRN: [id number]  Patient Care Team: Junie Spencer, FNP as PCP - General (Family Medicine) Corbin Ade, MD as Consulting Physician (Gastroenterology)   Chief Complaint:  Vomiting and Diarrhea (X 3 days but has stopped since 1:40 am )   HPI: Vincent Griffin is a 62 y.o. male presenting on 06/22/2022 for Vomiting and Diarrhea (X 3 days but has stopped since 1:40 am )   Diarrhea  This is a new problem. Episode onset: Sunday. The problem occurs 2 to 4 times per day. The problem has been gradually improving. The stool consistency is described as Watery. Associated symptoms include bloating, increased flatus and vomiting. Pertinent negatives include no abdominal pain, arthralgias, chills, coughing, fever, headaches, myalgias, sweats, URI or weight loss. Exacerbated by: eating. There are no known risk factors. He has tried anti-motility drug for the symptoms. The treatment provided significant relief.  Emesis  This is a new problem. Episode onset: Sunday. The problem occurs less than 2 times per day. The problem has been rapidly improving. The emesis has an appearance of bile and stomach contents. There has been no fever. Associated symptoms include diarrhea. Pertinent negatives include no abdominal pain, arthralgias, chest pain, chills, coughing, dizziness, fever, headaches, myalgias, sweats, URI or weight loss. He has tried nothing for the symptoms.    Relevant past medical, surgical, family, and social history reviewed and updated as indicated.  Allergies and medications reviewed and updated. Data reviewed: Chart in Epic.   Past Medical History:  Diagnosis Date   CAD (coronary artery disease)    Chronic kidney disease    Kidney stones   Hypercholesterolemia     Past Surgical History:  Procedure Laterality Date   CARDIAC CATHETERIZATION  07/2020   with stent.   CATARACT EXTRACTION     CATARACT EXTRACTION  W/PHACO Left 01/07/2016   Procedure: CATARACT EXTRACTION PHACO AND INTRAOCULAR LENS PLACEMENT LEFT EYE CDE=13.32;  Surgeon: [name], MD;  Location: [facility];  Service: Ophthalmology;  Laterality: Left;   SMALL INTESTINE SURGERY  1972    Social History   Socioeconomic History   Marital status: Married    Spouse name: Not on file   Number of children: Not on file   Years of education: Not on file   Highest education level: Not on file  Occupational History   Not on file  Tobacco Use   Smoking status: Heavy Smoker    Packs/day: 1.00    Types: Cigarettes   Smokeless tobacco: Never  Substance and Sexual Activity   Alcohol use: No   Drug use: No   Sexual activity: Not on file  Other Topics Concern   Not on file  Social History Narrative   Not on file   Social Determinants of Health   Financial Resource Strain: Not on file  Food Insecurity: Not on file  Transportation Needs: Not on file  Physical Activity: Not on file  Stress: Not on file  Social Connections: Not on file  Intimate Partner Violence: Not on file    Outpatient Encounter Medications as of 06/22/2022  Medication Sig   nitroGLYCERIN (NITROSTAT) 0.4 MG SL tablet Place under the tongue.   ondansetron (ZOFRAN) 4 MG tablet Take 1 tablet (4 mg total) by mouth every 8 (eight) hours as needed for nausea or vomiting.   aspirin 81 MG chewable tablet CHEW AND SWALLOW 1 TABLET (81 MG)  BY MOUTH DAILY   atorvastatin (LIPITOR) 80 MG tablet Take 80 mg by mouth at bedtime.   fluticasone (FLONASE) 50 MCG/ACT nasal spray Place 2 sprays into both nostrils daily.   metoprolol succinate (TOPROL-XL) 25 MG 24 hr tablet Take 25 mg by mouth daily.   Multiple Vitamin (MULTIVITAMIN) tablet Take 1 tablet by mouth daily.   tiZANidine (ZANAFLEX) 4 MG tablet Take 4 mg by mouth every 6 (six) hours as needed for muscle spasms.   [DISCONTINUED] clopidogrel (PLAVIX) 75 MG tablet Take 75 mg by mouth daily. (Patient not taking: Reported on 06/22/2022)    [DISCONTINUED] cyclobenzaprine (FLEXERIL) 10 MG tablet Take 1 tablet (10 mg total) by mouth 3 (three) times daily as needed for muscle spasms. (Patient not taking: Reported on 06/22/2022)   [DISCONTINUED] guaiFENesin (MUCINEX) 600 MG 12 hr tablet Take 1 tablet (600 mg total) by mouth 2 (two) times daily. (Patient not taking: Reported on 06/22/2022)   [DISCONTINUED] nicotine polacrilex (NICORETTE) 4 MG gum Take by mouth. (Patient not taking: Reported on 06/24/2021)   [DISCONTINUED] predniSONE (STERAPRED UNI-PAK 21 TAB) 10 MG (21) TBPK tablet As directed x 6 days   [DISCONTINUED] sulfamethoxazole-trimethoprim (BACTRIM DS) 800-160 MG tablet Take 1 tablet by mouth 2 (two) times daily.   No facility-administered encounter medications on file as of 06/22/2022.    No Known Allergies  Review of Systems  Constitutional:  Positive for appetite change. Negative for activity change, chills, diaphoresis, fatigue, fever, unexpected weight change and weight loss.  HENT: Negative.    Eyes: Negative.  Negative for photophobia and visual disturbance.  Respiratory:  Negative for cough, chest tightness and shortness of breath.   Cardiovascular:  Negative for chest pain, palpitations and leg swelling.  Gastrointestinal:  Positive for bloating, diarrhea, flatus, nausea and vomiting. Negative for abdominal distention, abdominal pain, anal bleeding, blood in stool, constipation and rectal pain.  Endocrine: Negative.   Genitourinary:  Negative for decreased urine volume, difficulty urinating, dysuria, frequency and urgency.  Musculoskeletal:  Negative for arthralgias and myalgias.  Skin: Negative.   Allergic/Immunologic: Negative.   Neurological:  Negative for dizziness, tremors, seizures, syncope, facial asymmetry, speech difficulty, weakness, [place]-headedness, numbness and headaches.  Hematological: Negative.   Psychiatric/Behavioral:  Negative for confusion, hallucinations, sleep disturbance and suicidal ideas.   All  other systems reviewed and are negative.       Objective:  BP 118/76   Pulse 73   Temp (!) 96.7 F (35.9 C) (Temporal)   Ht 5\' 9"  (1.753 m)   Wt (!) 303 lb 12.8 oz (137.8 kg)   SpO2 98%   BMI 44.86 kg/m    Wt Readings from Last 3 Encounters:  06/22/22 (!) 303 lb 12.8 oz (137.8 kg)  06/24/21 (!) 321 lb (145.6 kg)  06/01/21 (!) 325 lb (147.4 kg)    Physical Exam Vitals and nursing note reviewed.  Constitutional:      General: He is not in acute distress.    Appearance: Normal appearance. He is well-developed and well-groomed. He is morbidly obese. He is not ill-appearing, toxic-appearing or diaphoretic.  HENT:     Head: Normocephalic and atraumatic.     Jaw: There is normal jaw occlusion.     Right Ear: Hearing normal.     Left Ear: Hearing normal.     Nose: Nose normal.     Mouth/Throat:     Lips: Pink.     Mouth: Mucous membranes are moist.     Pharynx: Uvula midline.  Eyes:  General: Lids are normal.     Pupils: Pupils are equal, round, and reactive to [place].  Neck:     Thyroid: No thyroid mass, thyromegaly or thyroid tenderness.     Vascular: No carotid bruit or JVD.     Trachea: Trachea and phonation normal.  Cardiovascular:     Rate and Rhythm: Normal rate and regular rhythm.     Chest Wall: PMI is not displaced.     Pulses: Normal pulses.     Heart sounds: Normal heart sounds. No murmur heard.    No friction rub. No gallop.  Pulmonary:     Effort: Pulmonary effort is normal. No respiratory distress.     Breath sounds: Normal breath sounds. No wheezing.  Abdominal:     General: Bowel sounds are normal. There is no distension or abdominal bruit.     Palpations: Abdomen is soft. There is no hepatomegaly or splenomegaly.     Tenderness: There is no abdominal tenderness. There is no right CVA tenderness or left CVA tenderness.     Hernia: No hernia is present.  Musculoskeletal:        General: Normal range of motion.     Cervical back: Normal range of  motion and neck supple.     Right lower leg: No edema.     Left lower leg: No edema.  Lymphadenopathy:     Cervical: No cervical adenopathy.  Skin:    General: Skin is warm and dry.     Capillary Refill: Capillary refill takes less than 2 seconds.     Coloration: Skin is not cyanotic, jaundiced or pale.     Findings: No rash.  Neurological:     General: No focal deficit present.     Mental Status: He is alert and oriented to person, place, and time.     Sensory: Sensation is intact.     Motor: Motor function is intact.     Coordination: Coordination is intact.     Gait: Gait is intact.     Deep Tendon Reflexes: Reflexes are normal and symmetric.  Psychiatric:        Attention and Perception: Attention and perception normal.        Mood and Affect: Mood and affect normal.        Speech: Speech normal.        Behavior: Behavior normal. Behavior is cooperative.        Thought Content: Thought content normal.        Cognition and Memory: Cognition and memory normal.        Judgment: Judgment normal.     Results for orders placed or performed during the hospital encounter of 06/01/21  Comprehensive metabolic panel  Result Value Ref Range   Sodium 138 135 - 145 mmol/L   Potassium 3.7 3.5 - 5.1 mmol/L   Chloride 103 98 - 111 mmol/L   CO2 29 22 - 32 mmol/L   Glucose, Bld 82 70 - 99 mg/dL   BUN 10 6 - 20 mg/dL   Creatinine, Ser 1.02 0.61 - 1.24 mg/dL   Calcium 8.5 (L) 8.9 - 10.3 mg/dL   Total Protein 6.9 6.5 - 8.1 g/dL   Albumin 3.5 3.5 - 5.0 g/dL   AST 21 15 - 41 U/L   ALT 24 0 - 44 U/L   Alkaline Phosphatase 96 38 - 126 U/L   Total Bilirubin 0.3 0.3 - 1.2 mg/dL   GFR, Estimated >60 >60 mL/min   Anion  gap 6 5 - 15  Magnesium  Result Value Ref Range   Magnesium 1.9 1.7 - 2.4 mg/dL  CBC with Differential/Platelet  Result Value Ref Range   WBC 8.3 4.0 - 10.5 K/uL   RBC 4.37 4.22 - 5.81 MIL/uL   Hemoglobin 14.3 13.0 - 17.0 g/dL   HCT 44.8 39.0 - 52.0 %   MCV 102.5 (H)  80.0 - 100.0 fL   MCH 32.7 26.0 - 34.0 pg   MCHC 31.9 30.0 - 36.0 g/dL   RDW 13.6 11.5 - 15.5 %   Platelets 205 150 - 400 K/uL   nRBC 0.0 0.0 - 0.2 %   Neutrophils Relative % 54 %   Neutro Abs 4.5 1.7 - 7.7 K/uL   Lymphocytes Relative 31 %   Lymphs Abs 2.6 0.7 - 4.0 K/uL   Monocytes Relative 10 %   Monocytes Absolute 0.8 0.1 - 1.0 K/uL   Eosinophils Relative 4 %   Eosinophils Absolute 0.4 0.0 - 0.5 K/uL   Basophils Relative 1 %   Basophils Absolute 0.1 0.0 - 0.1 K/uL   Immature Granulocytes 0 %   Abs Immature Granulocytes 0.03 0.00 - 0.07 K/uL  Protime-INR  Result Value Ref Range   Prothrombin Time 12.9 11.4 - 15.2 seconds   INR 1.0 0.8 - 1.2  Troponin I (High Sensitivity)  Result Value Ref Range   Troponin I (High Sensitivity) 5 <18 ng/L  Troponin I (High Sensitivity)  Result Value Ref Range   Troponin I (High Sensitivity) 5 <18 ng/L       Pertinent labs & imaging results that were available during my care of the patient were reviewed by me and considered in my medical decision making.  Assessment & Plan:  Ksean was seen today for vomiting and diarrhea.  Diagnoses and all orders for this visit:  Nausea and vomiting in adult Diarrhea in adult patient Gastroenteritis Influenza negative. Viral gastroenteritis. BRAT diet discussed in detail, advance as tolerated. Will check CBC and BMP for abnormalities. Report new, worsening, or persistent symptoms.  -     Veritor Flu A/B Waived -     BMP8+EGFR -     CBC with Differential/Platelet -     ondansetron (ZOFRAN) 4 MG tablet; Take 1 tablet (4 mg total) by mouth every 8 (eight) hours as needed for nausea or vomiting.   Screening for colorectal cancer Did not see GI for colonoscopy when referred by PCP, will place new referral today. -     Ambulatory referral to Gastroenterology     Continue all other maintenance medications.  Follow up plan: Return if symptoms worsen or fail to improve.   Continue healthy lifestyle  choices, including diet (rich in fruits, vegetables, and lean proteins, and low in salt and simple carbohydrates) and exercise (at least 30 minutes of moderate physical activity daily).  Educational handout given for viral gastroenteritis  The above assessment and management plan was discussed with the patient. The patient verbalized understanding of and has agreed to the management plan. Patient is aware to call the clinic if they develop any new symptoms or if symptoms persist or worsen. Patient is aware when to return to the clinic for a follow-up visit. Patient educated on when it is appropriate to go to the emergency department.   Monia Pouch, FNP-C [facility] [phone]

## 2022-06-23 ENCOUNTER — Ambulatory Visit: Payer: BC Managed Care – PPO

## 2022-06-23 LAB — CBC WITH DIFFERENTIAL/PLATELET
Basophils Absolute: 0 10*3/uL (ref 0.0–0.2)
Basos: 0 %
EOS (ABSOLUTE): 0.2 10*3/uL (ref 0.0–0.4)
Eos: 3 %
Hematocrit: 45 % (ref 37.5–51.0)
Hemoglobin: 14.8 g/dL (ref 13.0–17.7)
Immature Grans (Abs): 0 10*3/uL (ref 0.0–0.1)
Immature Granulocytes: 0 %
Lymphocytes Absolute: 1.6 10*3/uL (ref 0.7–3.1)
Lymphs: 22 %
MCH: 31 pg (ref 26.6–33.0)
MCHC: 32.9 g/dL (ref 31.5–35.7)
MCV: 94 fL (ref 79–97)
Monocytes Absolute: 0.6 10*3/uL (ref 0.1–0.9)
Monocytes: 8 %
Neutrophils Absolute: 5 10*3/uL (ref 1.4–7.0)
Neutrophils: 67 %
Platelets: 245 10*3/uL (ref 150–450)
RBC: 4.77 x10E6/uL (ref 4.14–5.80)
RDW: 12.6 % (ref 11.6–15.4)
WBC: 7.5 10*3/uL (ref 3.4–10.8)

## 2022-06-23 LAB — BMP8+EGFR
BUN/Creatinine Ratio: 9 (ref 9–20)
BUN: 10 mg/dL (ref 6–24)
CO2: 25 mmol/L (ref 20–29)
Calcium: 8.9 mg/dL (ref 8.7–10.2)
Chloride: 104 mmol/L (ref 96–106)
Creatinine, Ser: 1.12 mg/dL (ref 0.76–1.27)
Glucose: 91 mg/dL (ref 70–99)
Potassium: 4.2 mmol/L (ref 3.5–5.2)
Sodium: 143 mmol/L (ref 134–144)
eGFR: 76 mL/min/{1.73_m2} (ref >59)

## 2022-06-24 ENCOUNTER — Encounter: Payer: Self-pay | Admitting: *Deleted

## 2022-07-24 NOTE — Progress Notes (Unsigned)
GI Office Note    Referring Provider: Baruch Gouty, FNP Primary Care Physician:  Sharion Balloon, [place] Primary Gastroenterologist: Cristopher Estimable.Rourk, MD  Date:  07/24/2022  ID:  Angelique Blonder, DOB 1961/01/19, MRN [id number]   Chief Complaint   No chief complaint on file.   History of Present Illness  Vincent Griffin is a 62 y.o. male with a history of rectal bleeding, CAD s/p DES to RCA in 2022, hypercholesteremia*** presenting today for evaluation prior to scheduling colonoscopy.  Last seen in the office January 2023 for consult of rectal bleeding.  Patient reports episodes of painless rectal bleeding in December 2022.  Denies any straining, constipation, abdominal pain, weight loss, lack of appetite, dysphagia, GERD, or change in bowel habits.  Patient reported a history of small intestine surgery as a child.  Noted to have been seen in January 2022 by cardiology who noted history of NSTEMI with elevated troponins in March 2022.  He underwent cardiac cath with PCI and DES to RCA.  Dominant stating patient wants at acceptable risk for colonoscopy but delayed till after 1 year of DAPT.  Patient was to be scheduled for colonoscopy after August 12, 2021 and request from cardiology or PCP to hold Plavix for 5 days prior.  Per review of notes in the chart, clearance was requested from PCP however no follow-ups were made by the patient or appointments were canceled by the patient to be seen for preoperative clearance.  Last 06/22/2022: Hemoglobin 14.8, platelets 245 normal renal function and electrolytes.  Today:     Current Outpatient Medications  Medication Sig Dispense Refill   aspirin 81 MG chewable tablet CHEW AND SWALLOW 1 TABLET (81 MG) BY MOUTH DAILY     atorvastatin (LIPITOR) 80 MG tablet Take 80 mg by mouth at bedtime.     fluticasone (FLONASE) 50 MCG/ACT nasal spray Place 2 sprays into both nostrils daily. 16 g 6   metoprolol succinate (TOPROL-XL) 25 MG 24 hr tablet Take 25 mg by  mouth daily.     Multiple Vitamin (MULTIVITAMIN) tablet Take 1 tablet by mouth daily.     nitroGLYCERIN (NITROSTAT) 0.4 MG SL tablet Place under the tongue.     ondansetron (ZOFRAN) 4 MG tablet Take 1 tablet (4 mg total) by mouth every 8 (eight) hours as needed for nausea or vomiting. 20 tablet 0   tiZANidine (ZANAFLEX) 4 MG tablet Take 4 mg by mouth every 6 (six) hours as needed for muscle spasms.     No current facility-administered medications for this visit.    Past Medical History:  Diagnosis Date   CAD (coronary artery disease)    Chronic kidney disease    Kidney stones   Hypercholesterolemia     Past Surgical History:  Procedure Laterality Date   CARDIAC CATHETERIZATION  07/2020   with stent.   CATARACT EXTRACTION     CATARACT EXTRACTION W/PHACO Left 01/07/2016   Procedure: CATARACT EXTRACTION PHACO AND INTRAOCULAR LENS PLACEMENT LEFT EYE CDE=13.32;  Surgeon: Tonny Branch, MD;  Location: [facility];  Service: Ophthalmology;  Laterality: Left;   SMALL INTESTINE SURGERY  1972    Family History  Problem Relation Age of Onset   Diabetes Mother    Stroke Mother    Colon polyps Brother    Colon cancer Neg Hx     Allergies as of 07/25/2022   (No Known Allergies)    Social History   Socioeconomic History   Marital status: Married  Spouse name: Not on file   Number of children: Not on file   Years of education: Not on file   Highest education level: Not on file  Occupational History   Not on file  Tobacco Use   Smoking status: Heavy Smoker    Packs/day: 1.00    Types: Cigarettes   Smokeless tobacco: Never  Substance and Sexual Activity   Alcohol use: No   Drug use: No   Sexual activity: Not on file  Other Topics Concern   Not on file  Social History Narrative   Not on file   Social Determinants of Health   Financial Resource Strain: Not on file  Food Insecurity: Not on file  Transportation Needs: Not on file  Physical Activity: Not on file  Stress: Not  on file  Social Connections: Not on file     Review of Systems   Gen: Denies fever, chills, anorexia. Denies fatigue, weakness, weight loss.  CV: Denies chest pain, palpitations, syncope, peripheral edema, and claudication. Resp: Denies dyspnea at rest, cough, wheezing, coughing up blood, and pleurisy. GI: See HPI Derm: Denies rash, itching, dry skin Psych: Denies depression, anxiety, memory loss, confusion. No homicidal or suicidal ideation.  Heme: Denies bruising, bleeding, and enlarged lymph nodes.   Physical Exam   There were no vitals taken for this visit.  General:   Alert and oriented. No distress noted. Pleasant and cooperative.  Head:  Normocephalic and atraumatic. Eyes:  Conjuctiva clear without scleral icterus. Mouth:  Oral mucosa pink and moist. Good dentition. No lesions. Lungs:  Clear to auscultation bilaterally. No wheezes, rales, or rhonchi. No distress.  Heart:  S1, S2 present without murmurs appreciated.  Abdomen:  +BS, soft, non-tender and non-distended. No rebound or guarding. No HSM or masses noted. Rectal: *** Msk:  Symmetrical without gross deformities. Normal posture. Extremities:  Without edema. Neurologic:  Alert and  oriented x4 Psych:  Alert and cooperative. Normal mood and affect.   Assessment  Vincent Griffin is a 62 y.o. male with a history of rectal bleeding, CAD s/p DES to RCA in 2022, hypercholesteremia*** presenting today for evaluation prior to scheduling colonoscopy.  Rectal bleeding, screening for colon cancer:    PLAN   *** Proceed with colonoscopy with propofol by Dr. Gala Romney  in near future: the risks, benefits, and alternatives have been discussed with the patient in detail. The patient states understanding and desires to proceed. ASA 3      Venetia Night, MSN, FNP-BC, AGACNP-BC [facility]

## 2022-07-25 ENCOUNTER — Ambulatory Visit (INDEPENDENT_AMBULATORY_CARE_PROVIDER_SITE_OTHER): Payer: BC Managed Care – PPO | Admitting: Gastroenterology

## 2022-07-25 ENCOUNTER — Encounter: Payer: Self-pay | Admitting: Gastroenterology

## 2022-07-25 VITALS — BP 123/78 | HR 68 | Temp 97.4°F | Ht 69.0 in | Wt 302.2 lb

## 2022-07-25 DIAGNOSIS — R1030 Lower abdominal pain, unspecified: Secondary | ICD-10-CM

## 2022-07-25 DIAGNOSIS — Z1211 Encounter for screening for malignant neoplasm of colon: Secondary | ICD-10-CM | POA: Diagnosis not present

## 2022-07-25 DIAGNOSIS — K625 Hemorrhage of anus and rectum: Secondary | ICD-10-CM

## 2022-07-25 NOTE — Patient Instructions (Addendum)
Please schedule you for colonoscopy in the near future with Dr. Abbey Chatters.  There are not any medication adjustments that we need to make at this time.  You received separate detailed written instructions regarding your prep.  Continue to monitor for any worsening rectal bleeding.  If this becomes more frequent or you have multiple episodes in a row to begin to experience any lightheadedness, dizziness, fatigue, shortness of breath or feelings like you may pass out then you may proceed to the ED.  Please call with any questions or concerns.  It was a pleasure to see you today. I want to create trusting relationships with patients. If you receive a survey regarding your visit,  I greatly appreciate you taking time to fill this out on paper or through your MyChart. I value your feedback.  Venetia Night, MSN, FNP-BC, AGACNP-BC [facility]

## 2022-07-26 ENCOUNTER — Encounter: Payer: Self-pay | Admitting: Gastroenterology

## 2022-08-05 ENCOUNTER — Telehealth: Payer: Self-pay | Admitting: *Deleted

## 2022-08-05 NOTE — Telephone Encounter (Signed)
Pt left vm wanting to schedule procedure.  TCS with propofol by Dr. Abbey Chatters, ASA 3  [facility]

## 2022-08-08 ENCOUNTER — Encounter: Payer: Self-pay | Admitting: *Deleted

## 2022-08-08 ENCOUNTER — Telehealth: Payer: Self-pay | Admitting: *Deleted

## 2022-08-08 ENCOUNTER — Other Ambulatory Visit: Payer: Self-pay | Admitting: *Deleted

## 2022-08-08 ENCOUNTER — Encounter: Payer: Self-pay | Admitting: Gastroenterology

## 2022-08-08 MED ORDER — NA SULFATE-K SULFATE-MG SULF 17.5-3.13-1.6 GM/177ML PO SOLN: ORAL | 0 refills | Status: DC

## 2022-08-08 NOTE — Telephone Encounter (Signed)
Pt goes having issues with bloating and noticed a little [place] blood. Spoke with Courtney,NP and she says pt had issues with rectal bleeding in office. She says for pt to take beano or gas-x and avoid gas causing foods. Pt verbalized understanding.

## 2022-09-05 NOTE — Patient Instructions (Signed)
Vincent Griffin  09/05/2022     @   Your procedure is scheduled on 09/08/2022.  Report to [facility] at 9:30 [facility].M.  Call this number if you have problems the morning of surgery:  [phone]  If you experience any cold or flu symptoms such as cough, fever, chills, shortness of breath, etc. between now and your scheduled surgery, please notify us at the above number.   Remember:   Please follow the diet and prep instrucitons given to you by Dr Queen Blossom office.        Take these medicines the morning of surgery with A SIP OF WATER : Metoprolol    Do not wear jewelry, make-up or nail polish.  Do not wear lotions, powders, or perfumes, or deodorant.  Do not shave 48 hours prior to surgery.  Men may shave face and neck.  Do not bring valuables to the hospital.  [facility] is not responsible for any belongings or valuables.  Contacts, dentures or bridgework may not be worn into surgery.  Leave your suitcase in the car.  After surgery it may be brought to your room.  For patients admitted to the hospital, discharge time will be determined by your treatment team.  Patients discharged the day of surgery will not be allowed to drive home.   Name and phone number of your driver:   Family Special instructions:  N/A  Please read over the following fact sheets that you were given. Care and Recovery After Surgery  Colonoscopy, Adult A colonoscopy is a procedure to look at the entire large intestine. This procedure is done using a long, thin, flexible tube that has a camera on the end. You may have a colonoscopy: As a part of normal colorectal screening. If you have certain symptoms, such as: A low number of red blood cells in your blood (anemia). Diarrhea that does not go away. Pain in your abdomen. Blood in your stool. A colonoscopy can help screen for and diagnose medical problems, including: An abnormal growth of cells or tissue (tumor). Abnormal growths  within the lining of your intestine (polyps). Inflammation. Areas of bleeding. Tell your health care provider about: Any allergies you have. All medicines you are taking, including vitamins, herbs, eye drops, creams, and over-the-counter medicines. Any problems you or family members have had with anesthetic medicines. Any bleeding problems you have. Any surgeries you have had. Any medical conditions you have. Any problems you have had with having bowel movements. Whether you are pregnant or may be pregnant. What are the risks? Generally, this is a safe procedure. However, problems may occur, including: Bleeding. Damage to your intestine. Allergic reactions to medicines given during the procedure. Infection. This is rare. What happens before the procedure? Eating and drinking restrictions Follow instructions from your health care provider about eating or drinking restrictions, which may include: A few days before the procedure: Follow a low-fiber diet. Avoid nuts, seeds, dried fruit, raw fruits, and vegetables. 1-3 days before the procedure: Eat only gelatin dessert or ice pops. Drink only clear liquids, such as water, clear juice, clear broth or bouillon, black coffee or tea, or clear soft drinks or sports drinks. Avoid liquids that contain red or purple dye. The day of the procedure: Do not eat solid foods. You may continue to drink clear liquids until up to 2 hours before the procedure. Do not eat or drink anything starting 2 hours before the procedure, or within the time period that your health  care provider recommends. Bowel prep If you were prescribed a bowel prep to take by mouth (orally) to clean out your colon: Take it as told by your health care provider. Starting the day before your procedure, you will need to drink a large amount of liquid medicine. The liquid will cause you to have many bowel movements of loose stool until your stool becomes almost clear or [place]  green. If your skin or the opening between the buttocks (anus) gets irritated from diarrhea, you may relieve the irritation using: Wipes with medicine in them, such as adult wet wipes with aloe and vitamin E. A product to soothe skin, such as petroleum jelly. If you vomit while drinking the bowel prep: Take a break for up to 60 minutes. Begin the bowel prep again. Call your health care provider if you keep vomiting or you cannot take the bowel prep without vomiting. To clean out your colon, you may also be given: Laxative medicines. These help you have a bowel movement. Instructions for enema use. An enema is liquid medicine injected into your rectum. Medicines Ask your health care provider about: Changing or stopping your regular medicines or supplements. This is especially important if you are taking iron supplements, diabetes medicines, or blood thinners. Taking medicines such as aspirin and ibuprofen. These medicines can thin your blood. Do not take these medicines unless your health care provider tells you to take them. Taking over-the-counter medicines, vitamins, herbs, and supplements. General instructions Ask your health care provider what steps will be taken to help prevent infection. These may include washing skin with a germ-killing soap. If you will be going home right after the procedure, plan to have a responsible adult: Take you home from the hospital or clinic. You will not be allowed to drive. Care for you for the time you are told. What happens during the procedure?  An IV will be inserted into one of your veins. You will be given a medicine to make you fall asleep (general anesthetic). You will lie on your side with your knees bent. A lubricant will be put on the tube. Then the tube will be: Inserted into your anus. Gently eased through all parts of your large intestine. Air will be sent into your colon to keep it open. This may cause some pressure or cramping. Images  will be taken with the camera and will appear on a screen. A small tissue sample may be removed to be looked at under a microscope (biopsy). The tissue may be sent to a lab for testing if any signs of problems are found. If small polyps are found, they may be removed and checked for cancer cells. When the procedure is finished, the tube will be removed. The procedure may vary among health care providers and hospitals. What happens after the procedure? Your blood pressure, heart rate, breathing rate, and blood oxygen level will be monitored until you leave the hospital or clinic. You may have a small amount of blood in your stool. You may pass gas and have mild cramping or bloating in your abdomen. This is caused by the air that was used to open your colon during the exam. If you were given a sedative during the procedure, it can affect you for several hours. Do not drive or operate machinery until your health care provider says that it is safe. It is up to you to get the results of your procedure. Ask your health care provider, or the department that is doing  the procedure, when your results will be ready. Summary A colonoscopy is a procedure to look at the entire large intestine. Follow instructions from your health care provider about eating and drinking before the procedure. If you were prescribed an oral bowel prep to clean out your colon, take it as told by your health care provider. During the colonoscopy, a flexible tube with a camera on its end is inserted into the anus and then passed into all parts of the large intestine. This information is not intended to replace advice given to you by your health care provider. Make sure you discuss any questions you have with your health care provider. Document Revised: 04/26/2021 Document Reviewed: 12/23/2020 Elsevier Patient Education  [place] Anesthesia refers to the techniques, procedures, and medicines  that help a person stay safe and comfortable during surgery. Monitored anesthesia care, or sedation, is one type of anesthesia. You may have sedation if you do not need to be asleep for your procedure. Procedures that use sedation may include: Surgery to remove cataracts from your eyes. A dental procedure. A biopsy. This is when a tissue sample is removed and looked at under a microscope. You will be watched closely during your procedure. Your level of sedation or type of anesthesia may be changed to fit your needs. Tell a health care provider about: Any allergies you have. All medicines you are taking, including vitamins, herbs, eye drops, creams, and over-the-counter medicines. Any problems you or family members have had with anesthesia. Any bleeding problems you have. Any surgeries you have had. Any medical conditions or illnesses you have. This includes sleep apnea, cough, fever, or the flu. Whether you are pregnant or may be pregnant. Whether you use cigarettes, alcohol, or drugs. Any use of steroids, whether by mouth or as a cream. What are the risks? Your health care provider will talk with you about risks. These may include: Getting too much medicine (oversedation). Nausea. Allergic reactions to medicines. Trouble breathing. If this happens, a breathing tube may be used to help you breathe. It will be removed when you are awake and breathing on your own. Heart trouble. Lung trouble. Confusion that gets better with time (emergence delirium). What happens before the procedure? When to stop eating and drinking Follow instructions from your health care provider about what you may eat and drink. These may include: 8 hours before your procedure Stop eating most foods. Do not eat meat, fried foods, or fatty foods. Eat only [place] foods, such as toast or crackers. All liquids are okay except energy drinks and alcohol. 6 hours before your procedure Stop eating. Drink only clear  liquids, such as water, clear fruit juice, black coffee, plain tea, and sports drinks. Do not drink energy drinks or alcohol. 2 hours before your procedure Stop drinking all liquids. You may be allowed to take medicines with small sips of water. If you do not follow your health care provider's instructions, your procedure may be delayed or canceled. Medicines Ask your health care provider about: Changing or stopping your regular medicines. These include any diabetes medicines or blood thinners you take. Taking medicines such as aspirin and ibuprofen. These medicines can thin your blood. Do not take them unless your health care provider tells you to. Taking over-the-counter medicines, vitamins, herbs, and supplements. Testing You may have an exam or testing. You may have a blood or urine sample taken. General instructions Do not use any products that contain nicotine or tobacco  for at least 4 weeks before the procedure. These products include cigarettes, chewing tobacco, and vaping devices, such as e-cigarettes. If you need help quitting, ask your health care provider. If you will be going home right after the procedure, plan to have a responsible adult: Take you home from the hospital or clinic. You will not be allowed to drive. Care for you for the time you are told. What happens during the procedure?  Your blood pressure, heart rate, breathing, level of pain, and blood oxygen level will be monitored. An IV will be inserted into one of your veins. You may be given: A sedative. This helps you relax. Anesthesia. This will: Numb certain areas of your body. Make you fall asleep for surgery. You will be given medicines as needed to keep you comfortable. The more medicine you are given, the deeper your level of sedation will be. Your level of sedation may be changed to fit your needs. There are three levels of sedation: Mild sedation. At this level, you may feel awake and relaxed. You will be  able to follow directions. Moderate sedation. At this level, you will be sleepy. You may not remember the procedure. Deep sedation. At this level, you will be asleep. You will not remember the procedure. How you get the medicines will depend on your age and the procedure. They may be given as: A pill. This may be taken by mouth (orally) or inserted into the rectum. An injection. This may be into a vein or muscle. A spray through the nose. After your procedure is over, the medicine will be stopped. The procedure may vary among health care providers and hospitals. What happens after the procedure? Your blood pressure, heart rate, breathing rate, and blood oxygen level will be monitored until you leave the hospital or clinic. You may feel sleepy, clumsy, or nauseous. You may not remember what happened during or after the procedure. Sedation can affect you for several hours. Do not drive or use machinery until your health care provider says that it is safe. This information is not intended to replace advice given to you by your health care provider. Make sure you discuss any questions you have with your health care provider. Document Revised: 09/27/2021 Document Reviewed: 09/27/2021 Elsevier Patient Education  [place].

## 2022-09-06 ENCOUNTER — Encounter (HOSPITAL_COMMUNITY)
Admission: RE | Admit: 2022-09-06 | Discharge: 2022-09-06 | Disposition: A | Discharge status: Home / Self Care | Payer: BC Managed Care – PPO | Source: Ambulatory Visit | Attending: Internal Medicine | Admitting: Internal Medicine

## 2022-09-06 VITALS — BP 103/80 | HR 85 | Temp 98.7°F | Ht 70.0 in | Wt 319.0 lb

## 2022-09-06 DIAGNOSIS — K5730 Diverticulosis of large intestine without perforation or abscess without bleeding: Secondary | ICD-10-CM | POA: Diagnosis not present

## 2022-09-06 DIAGNOSIS — Z01818 Encounter for other preprocedural examination: Secondary | ICD-10-CM | POA: Insufficient documentation

## 2022-09-06 DIAGNOSIS — K648 Other hemorrhoids: Secondary | ICD-10-CM | POA: Diagnosis not present

## 2022-09-06 DIAGNOSIS — I2510 Atherosclerotic heart disease of native coronary artery without angina pectoris: Secondary | ICD-10-CM | POA: Diagnosis not present

## 2022-09-06 DIAGNOSIS — N289 Disorder of kidney and ureter, unspecified: Secondary | ICD-10-CM | POA: Insufficient documentation

## 2022-09-06 DIAGNOSIS — C180 Malignant neoplasm of cecum: Secondary | ICD-10-CM | POA: Diagnosis not present

## 2022-09-06 DIAGNOSIS — K635 Polyp of colon: Secondary | ICD-10-CM | POA: Diagnosis not present

## 2022-09-06 DIAGNOSIS — Z6841 Body Mass Index (BMI) 40.0 and over, adult: Secondary | ICD-10-CM | POA: Diagnosis not present

## 2022-09-06 DIAGNOSIS — D126 Benign neoplasm of colon, unspecified: Secondary | ICD-10-CM | POA: Diagnosis not present

## 2022-09-06 DIAGNOSIS — F17210 Nicotine dependence, cigarettes, uncomplicated: Secondary | ICD-10-CM | POA: Diagnosis not present

## 2022-09-06 DIAGNOSIS — I491 Atrial premature depolarization: Secondary | ICD-10-CM | POA: Insufficient documentation

## 2022-09-06 DIAGNOSIS — Z1211 Encounter for screening for malignant neoplasm of colon: Secondary | ICD-10-CM | POA: Diagnosis not present

## 2022-09-06 DIAGNOSIS — Z955 Presence of coronary angioplasty implant and graft: Secondary | ICD-10-CM | POA: Diagnosis not present

## 2022-09-06 DIAGNOSIS — I252 Old myocardial infarction: Secondary | ICD-10-CM | POA: Diagnosis not present

## 2022-09-06 LAB — BASIC METABOLIC PANEL
Anion gap: 9 (ref 5–15)
BUN: 15 mg/dL (ref 6–20)
CO2: 27 mmol/L (ref 22–32)
Calcium: 8.7 mg/dL — ABNORMAL LOW (ref 8.9–10.3)
Chloride: 98 mmol/L (ref 98–111)
Creatinine, Ser: 1.33 mg/dL — ABNORMAL HIGH (ref 0.61–1.24)
GFR, Estimated: >60 mL/min (ref >60)
Glucose, Bld: 100 mg/dL — ABNORMAL HIGH (ref 70–99)
Potassium: 4 mmol/L (ref 3.5–5.1)
Sodium: 134 mmol/L — ABNORMAL LOW (ref 135–145)

## 2022-09-08 ENCOUNTER — Ambulatory Visit (HOSPITAL_COMMUNITY)
Admission: RE | Admit: 2022-09-08 | Discharge: 2022-09-08 | Disposition: A | Discharge status: Home / Self Care | Payer: BC Managed Care – PPO | Attending: Internal Medicine | Admitting: Internal Medicine

## 2022-09-08 ENCOUNTER — Ambulatory Visit (HOSPITAL_COMMUNITY): Payer: BC Managed Care – PPO | Admitting: Anesthesiology

## 2022-09-08 ENCOUNTER — Encounter (HOSPITAL_COMMUNITY)
Admission: RE | Disposition: A | Discharge status: Home / Self Care | Payer: Self-pay | Source: Home / Self Care | Attending: Internal Medicine

## 2022-09-08 ENCOUNTER — Encounter (HOSPITAL_COMMUNITY): Payer: Self-pay

## 2022-09-08 ENCOUNTER — Telehealth: Payer: Self-pay | Admitting: Internal Medicine

## 2022-09-08 DIAGNOSIS — D123 Benign neoplasm of transverse colon: Secondary | ICD-10-CM | POA: Diagnosis not present

## 2022-09-08 DIAGNOSIS — C180 Malignant neoplasm of cecum: Secondary | ICD-10-CM | POA: Diagnosis not present

## 2022-09-08 DIAGNOSIS — I2510 Atherosclerotic heart disease of native coronary artery without angina pectoris: Secondary | ICD-10-CM | POA: Diagnosis not present

## 2022-09-08 DIAGNOSIS — F17210 Nicotine dependence, cigarettes, uncomplicated: Secondary | ICD-10-CM | POA: Insufficient documentation

## 2022-09-08 DIAGNOSIS — K5730 Diverticulosis of large intestine without perforation or abscess without bleeding: Secondary | ICD-10-CM | POA: Diagnosis not present

## 2022-09-08 DIAGNOSIS — K6389 Other specified diseases of intestine: Secondary | ICD-10-CM

## 2022-09-08 DIAGNOSIS — K648 Other hemorrhoids: Secondary | ICD-10-CM | POA: Diagnosis not present

## 2022-09-08 DIAGNOSIS — I491 Atrial premature depolarization: Secondary | ICD-10-CM | POA: Insufficient documentation

## 2022-09-08 DIAGNOSIS — N289 Disorder of kidney and ureter, unspecified: Secondary | ICD-10-CM | POA: Insufficient documentation

## 2022-09-08 DIAGNOSIS — K5669 Other partial intestinal obstruction: Secondary | ICD-10-CM | POA: Diagnosis not present

## 2022-09-08 DIAGNOSIS — D126 Benign neoplasm of colon, unspecified: Secondary | ICD-10-CM | POA: Insufficient documentation

## 2022-09-08 DIAGNOSIS — Z1211 Encounter for screening for malignant neoplasm of colon: Secondary | ICD-10-CM

## 2022-09-08 DIAGNOSIS — K635 Polyp of colon: Secondary | ICD-10-CM | POA: Insufficient documentation

## 2022-09-08 DIAGNOSIS — Z955 Presence of coronary angioplasty implant and graft: Secondary | ICD-10-CM | POA: Diagnosis not present

## 2022-09-08 DIAGNOSIS — D125 Benign neoplasm of sigmoid colon: Secondary | ICD-10-CM

## 2022-09-08 DIAGNOSIS — Z6841 Body Mass Index (BMI) 40.0 and over, adult: Secondary | ICD-10-CM | POA: Insufficient documentation

## 2022-09-08 DIAGNOSIS — I252 Old myocardial infarction: Secondary | ICD-10-CM | POA: Insufficient documentation

## 2022-09-08 HISTORY — PX: COLONOSCOPY WITH PROPOFOL: SHX5780

## 2022-09-08 HISTORY — PX: BIOPSY: SHX5522

## 2022-09-08 HISTORY — PX: POLYPECTOMY: SHX5525

## 2022-09-08 HISTORY — PX: SUBMUCOSAL TATTOO INJECTION: SHX6856

## 2022-09-08 SURGERY — COLONOSCOPY WITH PROPOFOL
Anesthesia: General

## 2022-09-08 MED ORDER — SPOT INK MARKER SYRINGE KIT
PACK | SUBMUCOSAL | Status: DC | PRN
  Administered 2022-09-08: 1 mL via SUBMUCOSAL

## 2022-09-08 MED ORDER — LIDOCAINE HCL (CARDIAC) PF 100 MG/5ML IV SOSY
PREFILLED_SYRINGE | INTRAVENOUS | Status: DC | PRN
  Administered 2022-09-08: 50 mg via INTRAVENOUS

## 2022-09-08 MED ORDER — LACTATED RINGERS IV SOLN: INTRAVENOUS | Status: DC

## 2022-09-08 MED ORDER — PROPOFOL 500 MG/50ML IV EMUL
INTRAVENOUS | Status: DC | PRN
  Administered 2022-09-08: 150 ug/kg/min via INTRAVENOUS

## 2022-09-08 MED ORDER — SPOT INK MARKER SYRINGE KIT
PACK | SUBMUCOSAL | Status: AC
  Filled 2022-09-08: qty 5

## 2022-09-08 MED ORDER — PROPOFOL 10 MG/ML IV BOLUS
INTRAVENOUS | Status: DC | PRN
  Administered 2022-09-08: 100 mg via INTRAVENOUS

## 2022-09-08 NOTE — Addendum Note (Signed)
Addended by: Armstead Peaks on: 09/08/2022 02:53 PM   Modules accepted: Orders

## 2022-09-08 NOTE — Transfer of Care (Signed)
Immediate Anesthesia Transfer of Care Note  Patient: Vincent Griffin  Procedure(s) Performed: COLONOSCOPY WITH PROPOFOL BIOPSY SUBMUCOSAL TATTOO INJECTION POLYPECTOMY  Patient Location: Short Stay  Anesthesia Type:General  Level of Consciousness: drowsy  Airway & Oxygen Therapy: Patient Spontanous Breathing  Post-op Assessment: Report given to RN and Post -op Vital signs reviewed and stable  Post vital signs: Reviewed and stable  Last Vitals:  Vitals Value Taken Time  BP 82/47 09/08/22 1113  Temp 37.1 C 09/08/22 1113  Pulse 67 09/08/22 1113  Resp 14 09/08/22 1113  SpO2 97 % 09/08/22 1113    Last Pain:  Vitals:   09/08/22 1113  TempSrc: Oral  PainSc: 0-No pain      Patients Stated Pain Goal: 6 (09/08/22 0929)  Complications: No notable events documented.

## 2022-09-08 NOTE — Op Note (Addendum)
[facility] Patient Name: Vincent Griffin Procedure Date: 09/08/2022 10:30 AM MRN: [id number] Date of Birth: 1961/02/06 Attending MD: Hennie Duos. Marletta Lor , [place], [id number] CSN: [id number] Age: 62 Admit Type: Outpatient Procedure:                Colonoscopy Indications:              Screening for colorectal malignant neoplasm Providers:                Hennie Duos. Marletta Lor, DO, Buel Ream. Museum/gallery exhibitions officer, Charity fundraiser,                            Judeth Cornfield. Jessee Avers, Technician Referring MD:              Medicines:                See the Anesthesia note for documentation of the                            administered medications Complications:            No immediate complications. Estimated Blood Loss:     Estimated blood loss was minimal. Procedure:                Pre-Anesthesia Assessment:                           - The anesthesia plan was to use monitored                            anesthesia care (MAC).                           After obtaining informed consent, the colonoscope                            was passed under direct vision. Throughout the                            procedure, the patient's blood pressure, pulse, and                            oxygen saturations were monitored continuously. The                            PCF-HQ190L ([id number]) scope was introduced through                            the anus and advanced to the the cecum, identified                            by the ileocecal valve. The colonoscopy was                            performed without difficulty. The patient tolerated                            the  procedure well. The quality of the bowel                            preparation was evaluated using the BBPS [facility]                            Bowel Preparation Scale) with scores of: Right                            Colon = 2 (minor amount of residual staining, small                            fragments of stool and/or opaque liquid, but mucosa                             seen well), Transverse Colon = 2 (minor amount of                            residual staining, small fragments of stool and/or                            opaque liquid, but mucosa seen well) and Left Colon                            = 2 (minor amount of residual staining, small                            fragments of stool and/or opaque liquid, but mucosa                            seen well). The total BBPS score equals 6. Fair. Scope In: 10:44:55 AM Scope Out: 11:07:05 AM Scope Withdrawal Time: 0 hours 18 minutes 5 seconds  Total Procedure Duration: 0 hours 22 minutes 10 seconds  Findings:      Non-bleeding internal hemorrhoids were found during retroflexion.      Multiple large-mouthed and small-mouthed diverticula were found in the       sigmoid colon and descending colon.      A frond-like/villous, fungating and submucosal partially obstructing       mass was found in what appeared to be the cecum though difficult to       fully discern given altered anatomy. The mass was partially       circumferential (involving one-half of the lumen circumference). No       bleeding was present. Biopsies were taken with a cold forceps for       histology. Area distal was successfully injected with 1 mL Spot (carbon       black) for tattooing.      Seven sessile polyps were found in the sigmoid colon and transverse       colon. The polyps were 5 to 8 mm in size. These polyps were removed with       a cold snare. Resection and retrieval were complete.      A moderate amount of semi-liquid stool was found in the sigmoid colon  and in the descending colon, making visualization difficult. Lavage of       the area was performed using copious amounts, resulting in clearance       with fair visualization. Impression:               - Non-bleeding internal hemorrhoids.                           - Diverticulosis in the sigmoid colon and in the                            descending colon.                            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 Biopsied. Injected.                           - Seven 5 to 8 mm polyps in the sigmoid colon and                            in the transverse colon, removed with a cold snare.                            Resected and retrieved.                           - Stool in the sigmoid colon and in the descending                            colon. Moderate Sedation:      Per Anesthesia Care Recommendation:           - Patient has a contact number available for                            emergencies. The signs and symptoms of potential                            delayed complications were discussed with the                            patient. Return to normal activities tomorrow.                            Written discharge instructions were provided to the                            patient.                           - Resume previous diet.                           - Continue present  medications.                           - Await pathology results.                           - Mass appeared to be in cecum though difficult to                            fully discern given altered anatomy. Will pursue CT                            chest/abd/pelvis for staging purposes as well as to                            hopefully confirm location of mass.                           - Referral to general surgery to discuss resection                           - Referral to Oncology Procedure Code(s):        --- Professional ---                           [phone], Colonoscopy, flexible; with removal of                            tumor(s), polyp(s), or other lesion(s) by snare                            technique                           45380, 59, Colonoscopy, flexible; with biopsy,                            single or multiple                           45381, Colonoscopy, flexible; with directed                             submucosal injection(s), any substance Diagnosis Code(s):        --- Professional ---                           Z12.11, Encounter for screening for malignant                            neoplasm of colon                           K64.8, Other hemorrhoids                           C18.0, Malignant neoplasm of cecum  K56.690, Other partial intestinal obstruction                           D12.5, Benign neoplasm of sigmoid colon                           D12.3, Benign neoplasm of transverse colon (hepatic                            flexure or splenic flexure)                           K57.30, Diverticulosis of large intestine without                            perforation or abscess without bleeding CPT copyright 2022 American Medical Association. All rights reserved. The codes documented in this report are preliminary and upon coder review may  be revised to meet current compliance requirements. Hennie Duos. Marletta Lor, DO Hennie Duos. Marletta Lor, DO 09/08/2022 11:14:08 AM This report has been signed electronically. Number of Addenda: 0

## 2022-09-08 NOTE — Discharge Instructions (Signed)
  Colonoscopy Discharge Instructions  Read the instructions outlined below and refer to this sheet in the next few weeks. These discharge instructions provide you with general information on caring for yourself after you leave the hospital. Your doctor may also give you specific instructions. While your treatment has been planned according to the most current medical practices available, unavoidable complications occasionally occur.   ACTIVITY You may resume your regular activity, but move at a slower pace for the next 24 hours.  Take frequent rest periods for the next 24 hours.  Walking will help get rid of the air and reduce the bloated feeling in your belly (abdomen).  No driving for 24 hours (because of the medicine (anesthesia) used during the test).   Do not sign any important legal documents or operate any machinery for 24 hours (because of the anesthesia used during the test).  NUTRITION Drink plenty of fluids.  You may resume your normal diet as instructed by your doctor.  Begin with a [place] meal and progress to your normal diet. Heavy or fried foods are harder to digest and may make you feel sick to your stomach (nauseated).  Avoid alcoholic beverages for 24 hours or as instructed.  MEDICATIONS You may resume your normal medications unless your doctor tells you otherwise.  WHAT YOU CAN EXPECT TODAY Some feelings of bloating in the abdomen.  Passage of more gas than usual.  Spotting of blood in your stool or on the toilet paper.  IF YOU HAD POLYPS REMOVED DURING THE COLONOSCOPY: No aspirin products for 7 days or as instructed.  No alcohol for 7 days or as instructed.  Eat a soft diet for the next 24 hours.  FINDING OUT THE RESULTS OF YOUR TEST Not all test results are available during your visit. If your test results are not back during the visit, make an appointment with your caregiver to find out the results. Do not assume everything is normal if you have not heard from your  caregiver or the medical facility. It is important for you to follow up on all of your test results.  SEEK IMMEDIATE MEDICAL ATTENTION IF: You have more than a spotting of blood in your stool.  Your belly is swollen (abdominal distention).  You are nauseated or vomiting.  You have a temperature over 101.  You have abdominal pain or discomfort that is severe or gets worse throughout the day.   Your colonoscopy revealed a mass on the right side of your colon. I am concerned this is colon cancer.   I took numerous biopsies of the mass, we will call with results hopefully in 1 to 2 days.  I am going to order CT chest abdomen pelvis for staging purposes.  I am also going to refer you to a general surgeon to discuss surgical resection.  You had 7 other polyps which I also removed.  We will call with results as well.  Repeat colonoscopy in 1 year.  Hennie Duos. Marletta Lor, D.O. Gastroenterology and Hepatology [facility]

## 2022-09-08 NOTE — Anesthesia Preprocedure Evaluation (Signed)
Anesthesia Evaluation  Patient identified by MRN, date of birth, ID band Patient awake    Reviewed: Allergy & Precautions, H&P , NPO status , Patient's Chart, lab work & pertinent test results  Airway Mallampati: II  TM Distance: >3 FB Neck ROM: Full    Dental no notable dental hx. (+) Missing, Dental Advisory Given   Pulmonary shortness of breath and with exertion, Current Smoker (Smoking Status: Heavy Smoker)Patient did not abstain from smoking.   + rhonchi        Cardiovascular Exercise Tolerance: Poor + CAD, + Past MI, + Cardiac Stents and + DOE  Normal cardiovascular exam Rhythm:Regular Rate:Normal     Neuro/Psych negative neurological ROS  negative psych ROS   GI/Hepatic negative GI ROS, Neg liver ROS, Bowel prep,,,  Endo/Other    Morbid obesity  Renal/GU Renal InsufficiencyRenal disease  negative genitourinary   Musculoskeletal negative musculoskeletal ROS (+)    Abdominal   Peds negative pediatric ROS (+)  Hematology negative hematology ROS (+)   Anesthesia Other Findings   Reproductive/Obstetrics negative OB ROS                             Anesthesia Physical Anesthesia Plan  ASA: 3  Anesthesia Plan: General   Post-op Pain Management: Minimal or no pain anticipated   Induction: Intravenous  PONV Risk Score and Plan: Propofol infusion  Airway Management Planned: Nasal Cannula and Natural Airway  Additional Equipment:   Intra-op Plan:   Post-operative Plan:   Informed Consent: I have reviewed the patients History and Physical, chart, labs and discussed the procedure including the risks, benefits and alternatives for the proposed anesthesia with the patient or authorized representative who has indicated his/her understanding and acceptance.     Dental advisory given  Plan Discussed with: CRNA and Surgeon  Anesthesia Plan Comments:        Anesthesia Quick  Evaluation

## 2022-09-08 NOTE — Telephone Encounter (Signed)
Referral sent to Dr. Lovell Sheehan  PA submitted via [facility] CT A/P "Order ID: [id number]      Approval Valid Through: 09/08/2022 - 10/07/2022"  CT Chest "Order ID: [id number]       Approval Valid Through: 09/08/2022 - 10/07/2022" --------  Called pt, LMOVM to call back  CT's scheduled for 4/30, arrival 3:15pm to start drinking oral contrast, npo 4 hrs prior

## 2022-09-08 NOTE — Anesthesia Postprocedure Evaluation (Signed)
Anesthesia Post Note  Patient: Vincent Griffin  Procedure(s) Performed: COLONOSCOPY WITH PROPOFOL BIOPSY SUBMUCOSAL TATTOO INJECTION POLYPECTOMY  Patient location during evaluation: Phase II Anesthesia Type: General Level of consciousness: awake and alert and oriented Pain management: pain level controlled Vital Signs Assessment: post-procedure vital signs reviewed and stable Respiratory status: spontaneous breathing, nonlabored ventilation and respiratory function stable Cardiovascular status: blood pressure returned to baseline and stable Postop Assessment: no apparent nausea or vomiting Anesthetic complications: no  No notable events documented.   Last Vitals:  Vitals:   09/08/22 1113 09/08/22 1117  BP: (!) 82/47 100/80  Pulse: 67   Resp: 14   Temp: 37.1 C   SpO2: 97%     Last Pain:  Vitals:   09/08/22 1113  TempSrc: Oral  PainSc: 0-No pain                 Matias Thurman C Zykee Avakian

## 2022-09-08 NOTE — Telephone Encounter (Signed)
Hey Mindy, just finished this patient's screening colonoscopy.  Unfortunately, he has a mass in the right side of his colon concerning for malignancy.  I took multiple biopsies of this and hopefully will have these results in 1 to 2 days.  Can we set him up for CT chest abdomen pelvis with contrast for staging purposes?   Can we also refer to Dr. Lovell Sheehan to discuss resection who I have already discussed patient with.  Counseled patient in depth today.  All questions/concerns addressed.  CCing patient's PCP [name] so she is aware.  Thank you!

## 2022-09-08 NOTE — H&P (Signed)
Primary Care Physician:  Junie Spencer, FNP Primary Gastroenterologist:  Dr. Marletta Lor  Pre-Procedure History & Physical: HPI:  Vincent Griffin is a 62 y.o. male is here for first ever colonoscopy for colon cancer screening purposes.  Patient denies any family history of colorectal cancer.  No melena or hematochezia.  No abdominal pain or unintentional weight loss.  No change in bowel habits.  Overall feels well from a GI standpoint.  Past Medical History:  Diagnosis Date   CAD (coronary artery disease)    Chronic kidney disease    Kidney stones   Hypercholesterolemia     Past Surgical History:  Procedure Laterality Date   CARDIAC CATHETERIZATION  07/2020   with stent.   CATARACT EXTRACTION     CATARACT EXTRACTION W/PHACO Left 01/07/2016   Procedure: CATARACT EXTRACTION PHACO AND INTRAOCULAR LENS PLACEMENT LEFT EYE CDE=13.32;  Surgeon: [name], MD;  Location: [facility];  Service: Ophthalmology;  Laterality: Left;   SMALL INTESTINE SURGERY  1972    Prior to Admission medications   Medication Sig Start Date End Date Taking? Authorizing Provider  aspirin 81 MG chewable tablet Chew 81 mg by mouth daily. 08/13/20  Yes [provider]  atorvastatin (LIPITOR) 80 MG tablet Take 80 mg by mouth at bedtime. 12/09/20  Yes [provider]  metoprolol succinate (TOPROL-XL) 25 MG 24 hr tablet Take 25 mg by mouth daily. 12/10/20  Yes [provider]  Na Sulfate-K Sulfate-Mg Sulf 17.5-3.13-1.6 GM/177ML SOLN As directed 08/08/22  Yes Krysten Veronica, Hennie Duos, DO  nitroGLYCERIN (NITROSTAT) 0.4 MG SL tablet Place 0.4 mg under the tongue every 5 (five) minutes as needed for chest pain. 08/13/20   [provider]  ondansetron (ZOFRAN) 4 MG tablet Take 1 tablet (4 mg total) by mouth every 8 (eight) hours as needed for nausea or vomiting. Patient not taking: Reported on 07/25/2022 06/22/22   Sonny Masters, FNP    Allergies as of 08/08/2022   (No Known Allergies)    Family History   Problem Relation Age of Onset   Diabetes Mother    Stroke Mother    Colon polyps Brother    Colon cancer Neg Hx     Social History   Socioeconomic History   Marital status: Married    Spouse name: Not on file   Number of children: Not on file   Years of education: Not on file   Highest education level: Not on file  Occupational History   Not on file  Tobacco Use   Smoking status: Heavy Smoker    Packs/day: 1    Types: Cigarettes   Smokeless tobacco: Never  Substance and Sexual Activity   Alcohol use: No   Drug use: No   Sexual activity: Not on file  Other Topics Concern   Not on file  Social History Narrative   Not on file   Social Determinants of Health   Financial Resource Strain: Not on file  Food Insecurity: Not on file  Transportation Needs: Not on file  Physical Activity: Not on file  Stress: Not on file  Social Connections: Not on file  Intimate Partner Violence: Not on file    Review of Systems: See HPI, otherwise negative ROS  Physical Exam: Vital signs in last 24 hours: Temp:  [98.1 F (36.7 C)] 98.1 F (36.7 C) (04/25 0930) Pulse Rate:  [70] 70 (04/25 0930) Resp:  [18] 18 (04/25 0930) BP: (126)/(84) 126/84 (04/25 0930) SpO2:  [98 %] 98 % (  04/25 0930) Weight:  [144.7 kg] 144.7 kg (04/25 0930)   General:   Alert,  Well-developed, well-nourished, pleasant and cooperative in NAD Head:  Normocephalic and atraumatic. Eyes:  Sclera clear, no icterus.   Conjunctiva pink. Ears:  Normal auditory acuity. Nose:  No deformity, discharge,  or lesions. Msk:  Symmetrical without gross deformities. Normal posture. Extremities:  Without clubbing or edema. Neurologic:  Alert and  oriented x4;  grossly normal neurologically. Skin:  Intact without significant lesions or rashes. Psych:  Alert and cooperative. Normal mood and affect.  Impression/Plan: Vincent Griffin is here for a colonoscopy to be performed for colon cancer screening purposes.  The risks of  the procedure including infection, bleed, or perforation as well as benefits, limitations, alternatives and imponderables have been reviewed with the patient. Questions have been answered. All parties agreeable.

## 2022-09-09 NOTE — Telephone Encounter (Signed)
Called pt. He is aware of CT appt details. He voiced understanding.

## 2022-09-13 ENCOUNTER — Ambulatory Visit (HOSPITAL_COMMUNITY)
Admission: RE | Admit: 2022-09-13 | Discharge: 2022-09-13 | Disposition: A | Discharge status: Home / Self Care | Payer: BC Managed Care – PPO | Source: Ambulatory Visit | Attending: Internal Medicine | Admitting: Internal Medicine

## 2022-09-13 DIAGNOSIS — K6389 Other specified diseases of intestine: Secondary | ICD-10-CM | POA: Insufficient documentation

## 2022-09-13 DIAGNOSIS — N200 Calculus of kidney: Secondary | ICD-10-CM | POA: Diagnosis not present

## 2022-09-13 DIAGNOSIS — J432 Centrilobular emphysema: Secondary | ICD-10-CM | POA: Diagnosis not present

## 2022-09-13 DIAGNOSIS — R918 Other nonspecific abnormal finding of lung field: Secondary | ICD-10-CM | POA: Diagnosis not present

## 2022-09-13 DIAGNOSIS — K5730 Diverticulosis of large intestine without perforation or abscess without bleeding: Secondary | ICD-10-CM | POA: Diagnosis not present

## 2022-09-13 MED ORDER — IOHEXOL 9 MG/ML PO SOLN
ORAL | Status: AC
  Filled 2022-09-13: qty 1000

## 2022-09-13 MED ORDER — IOHEXOL 300 MG/ML  SOLN
100.0000 mL | Freq: Once | INTRAMUSCULAR | Status: AC | PRN
  Administered 2022-09-13: 100 mL via INTRAVENOUS

## 2022-09-14 ENCOUNTER — Encounter (HOSPITAL_COMMUNITY): Payer: Self-pay | Admitting: Internal Medicine

## 2022-09-15 ENCOUNTER — Other Ambulatory Visit: Payer: Self-pay | Admitting: *Deleted

## 2022-09-15 DIAGNOSIS — C801 Malignant (primary) neoplasm, unspecified: Secondary | ICD-10-CM

## 2022-09-15 LAB — SURGICAL PATHOLOGY

## 2022-09-22 ENCOUNTER — Encounter: Payer: Self-pay | Admitting: *Deleted

## 2022-09-22 ENCOUNTER — Encounter: Payer: Self-pay | Admitting: General Surgery

## 2022-09-22 ENCOUNTER — Ambulatory Visit: Payer: BC Managed Care – PPO | Admitting: General Surgery

## 2022-09-22 VITALS — BP 118/80 | HR 74 | Temp 98.3°F | Resp 16 | Ht 70.0 in | Wt 293.0 lb

## 2022-09-22 DIAGNOSIS — C180 Malignant neoplasm of cecum: Secondary | ICD-10-CM | POA: Diagnosis not present

## 2022-09-22 MED ORDER — SUTAB 1479-225-188 MG PO TABS: 24.0000 | ORAL_TABLET | Freq: Once | ORAL | 0 refills | Status: AC

## 2022-09-22 MED ORDER — NEOMYCIN SULFATE 500 MG PO TABS
1000.0000 mg | ORAL_TABLET | Freq: Three times a day (TID) | ORAL | 0 refills | Status: DC
Start: 2022-09-22 — End: 2022-10-10

## 2022-09-22 MED ORDER — METRONIDAZOLE 500 MG PO TABS
500.0000 mg | ORAL_TABLET | Freq: Three times a day (TID) | ORAL | 0 refills | Status: DC
Start: 2022-09-22 — End: 2022-10-10

## 2022-09-22 NOTE — Progress Notes (Signed)
Vincent Griffin; [id number]; 07/01/1960   HPI Patient is a 61-year-old white male who was referred to my care by Dr. 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  This was found on routine screening colonoscopy.  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  Patient denies any significant abdominal pain, weight change, or blood per rectum since the colonoscopy. Patient's history is significant for an NSTEMI in 2022 requiring stent placement.  He is followed by Dr. Means of cardiology at [facility].  He currently denies any shortness of breath or chest pain. Past Medical History:  Diagnosis Date   CAD (coronary artery disease)    Chronic kidney disease    Kidney stones   Hypercholesterolemia     Past Surgical History:  Procedure Laterality Date   BIOPSY  09/08/2022   Procedure: BIOPSY;  Surgeon: Carver, Charles K, DO;  Location: [facility];  Service: Endoscopy;;   CARDIAC CATHETERIZATION  07/2020   with stent.   CATARACT EXTRACTION     CATARACT EXTRACTION W/PHACO Left 01/07/2016   Procedure: CATARACT EXTRACTION PHACO AND INTRAOCULAR LENS PLACEMENT LEFT EYE CDE=13.32;  Surgeon: Kerry Hunt, MD;  Location: [facility];  Service: Ophthalmology;  Laterality: Left;   COLONOSCOPY WITH PROPOFOL N/A 09/08/2022   Procedure: COLONOSCOPY WITH PROPOFOL;  Surgeon: Carver, Charles K, DO;  Location: [facility];  Service: Endoscopy;  Laterality: N/A;  11:30 am   POLYPECTOMY  09/08/2022   Procedure: POLYPECTOMY;  Surgeon: Carver, Charles K, DO;  Location: [facility];  Service: Endoscopy;;   SMALL INTESTINE SURGERY  1972   SUBMUCOSAL TATTOO INJECTION  09/08/2022   Procedure: SUBMUCOSAL TATTOO INJECTION;  Surgeon: Carver, Charles K, DO;  Location: [facility];  Service: Endoscopy;;    Family History  Problem Relation Age of Onset   Diabetes Mother    Stroke Mother    Colon polyps Brother    Colon cancer Neg Hx     Current Outpatient  Medications on File Prior to Visit  Medication Sig Dispense Refill   aspirin 81 MG chewable tablet Chew 81 mg by mouth daily.     atorvastatin (LIPITOR) 80 MG tablet Take 80 mg by mouth at bedtime.     metoprolol succinate (TOPROL-XL) 25 MG 24 hr tablet Take 25 mg by mouth daily.     nitroGLYCERIN (NITROSTAT) 0.4 MG SL tablet Place 0.4 mg under the tongue every 5 (five) minutes as needed for chest pain.     No current facility-administered medications on file prior to visit.    Allergies  Allergen Reactions   Zetia [Ezetimibe] Diarrhea and Nausea Only    Upset stomach    Social History   Substance and Sexual Activity  Alcohol Use No    Social History   Tobacco Use  Smoking Status Heavy Smoker   Packs/day: 1   Types: Cigarettes  Smokeless Tobacco Never    Review of Systems  Constitutional: Negative.   HENT: Negative.    Eyes: Negative.   Respiratory: Negative.    Cardiovascular: Negative.   Gastrointestinal: Negative.   Genitourinary: Negative.   Musculoskeletal:  Positive for joint pain.  Skin: Negative.   Neurological: Negative.   Endo/Heme/Allergies: Negative.   Psychiatric/Behavioral: Negative.      Objective   Vitals:   09/22/22 0920  BP: 118/80  Pulse: 74  Resp: 16  Temp: 98.3 F (36.8 C)  SpO2: 94%    Physical Exam Vitals reviewed.  Constitutional:        Appearance: Normal appearance. He is obese. He is not ill-appearing.  HENT:     Head: Normocephalic and atraumatic.  Cardiovascular:     Rate and Rhythm: Normal rate and regular rhythm.     Heart sounds: Normal heart sounds. No murmur heard.    No friction rub. No gallop.  Pulmonary:     Effort: Pulmonary effort is normal. No respiratory distress.     Breath sounds: Normal breath sounds. No stridor. No wheezing, rhonchi or rales.  Abdominal:     General: Bowel sounds are normal. There is no distension.     Palpations: Abdomen is soft. There is no mass.     Tenderness: There is no  abdominal tenderness. There is no guarding or rebound.     Hernia: No hernia is present.     Comments: Some discomfort to deep palpation in the right lower quadrant.  Examination is limited due to body habitus.  Skin:    General: Skin is warm and dry.  Neurological:     Mental Status: He is alert and oriented to person, place, and time.    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 History of coronary artery disease, status post stent placement for NSTEMI in 2022.  Currently not on a blood thinner. Pulmonary nodules Plan  Patient is scheduled for robotic assisted laparoscopic partial colectomy on 10/07/2022.  Will get cardiac clearance prior to surgery.  The risks and benefits of the procedure including bleeding, infection, anastomotic leak, and the possibility of a blood transfusion were fully explained to the patient, who gave informed consent.  Sutabs, neomycin, and Flagyl have been ordered for preoperative bowel prep. 

## 2022-09-26 ENCOUNTER — Telehealth: Payer: Self-pay | Admitting: *Deleted

## 2022-09-26 NOTE — Telephone Encounter (Signed)
Received return note from [facility], [facility] [place], [place].   Reports that patient was last seen in January 2024 with significant symptoms that require follow up prior to clearance. Advised that patient will require OV.   Call placed to patient. Advised that he will require OV with cardiology prior to surgery. Advised that we will keep surgery as scheduled for now until he is able to schedule the visit.

## 2022-09-28 NOTE — H&P (Signed)
Vincent Griffin; [id number]; [birth year]   HPI Patient is a 62 year old white male who was referred to my care by Dr. 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  This was found on routine screening colonoscopy.  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  Patient denies any significant abdominal pain, weight change, or blood per rectum since the colonoscopy. Patient's history is significant for an NSTEMI in 2022 requiring stent placement.  He is followed by Dr. Charlestine Night of cardiology at [facility].  He currently denies any shortness of breath or chest pain. Past Medical History:  Diagnosis Date   CAD (coronary artery disease)    Chronic kidney disease    Kidney stones   Hypercholesterolemia     Past Surgical History:  Procedure Laterality Date   BIOPSY  09/08/2022   Procedure: BIOPSY;  Surgeon: [name], DO;  Location: [facility];  Service: Endoscopy;;   CARDIAC CATHETERIZATION  07/2020   with stent.   CATARACT EXTRACTION     CATARACT EXTRACTION W/PHACO Left 01/07/2016   Procedure: CATARACT EXTRACTION PHACO AND INTRAOCULAR LENS PLACEMENT LEFT EYE CDE=13.32;  Surgeon: [name], MD;  Location: [facility];  Service: Ophthalmology;  Laterality: Left;   COLONOSCOPY WITH PROPOFOL N/A 09/08/2022   Procedure: COLONOSCOPY WITH PROPOFOL;  Surgeon: [name], DO;  Location: [facility];  Service: Endoscopy;  Laterality: N/A;  11:30 am   POLYPECTOMY  09/08/2022   Procedure: POLYPECTOMY;  Surgeon: [name], DO;  Location: [facility];  Service: Endoscopy;;   SMALL INTESTINE SURGERY  1972   SUBMUCOSAL TATTOO INJECTION  09/08/2022   Procedure: SUBMUCOSAL TATTOO INJECTION;  Surgeon: [name], DO;  Location: [facility];  Service: Endoscopy;;    Family History  Problem Relation Age of Onset   Diabetes Mother    Stroke Mother    Colon polyps Brother    Colon cancer Neg Hx     Current Outpatient  Medications on File Prior to Visit  Medication Sig Dispense Refill   aspirin 81 MG chewable tablet Chew 81 mg by mouth daily.     atorvastatin (LIPITOR) 80 MG tablet Take 80 mg by mouth at bedtime.     metoprolol succinate (TOPROL-XL) 25 MG 24 hr tablet Take 25 mg by mouth daily.     nitroGLYCERIN (NITROSTAT) 0.4 MG SL tablet Place 0.4 mg under the tongue every 5 (five) minutes as needed for chest pain.     No current facility-administered medications on file prior to visit.    Allergies  Allergen Reactions   Zetia [Ezetimibe] Diarrhea and Nausea Only    Upset stomach    Social History   Substance and Sexual Activity  Alcohol Use No    Social History   Tobacco Use  Smoking Status Heavy Smoker   Packs/day: 1   Types: Cigarettes  Smokeless Tobacco Never    Review of Systems  Constitutional: Negative.   HENT: Negative.    Eyes: Negative.   Respiratory: Negative.    Cardiovascular: Negative.   Gastrointestinal: Negative.   Genitourinary: Negative.   Musculoskeletal:  Positive for joint pain.  Skin: Negative.   Neurological: Negative.   Endo/Heme/Allergies: Negative.   Psychiatric/Behavioral: Negative.      Objective   Vitals:   09/22/22 0920  BP: 118/80  Pulse: 74  Resp: 16  Temp: 98.3 F (36.8 C)  SpO2: 94%    Physical Exam Vitals reviewed.  Constitutional:  Appearance: Normal appearance. He is obese. He is not ill-appearing.  HENT:     Head: Normocephalic and atraumatic.  Cardiovascular:     Rate and Rhythm: Normal rate and regular rhythm.     Heart sounds: Normal heart sounds. No murmur heard.    No friction rub. No gallop.  Pulmonary:     Effort: Pulmonary effort is normal. No respiratory distress.     Breath sounds: Normal breath sounds. No stridor. No wheezing, rhonchi or rales.  Abdominal:     General: Bowel sounds are normal. There is no distension.     Palpations: Abdomen is soft. There is no mass.     Tenderness: There is no  abdominal tenderness. There is no guarding or rebound.     Hernia: No hernia is present.     Comments: Some discomfort to deep palpation in the right lower quadrant.  Examination is limited due to body habitus.  Skin:    General: Skin is warm and dry.  Neurological:     Mental Status: He is alert and oriented to person, place, and time.    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 History of coronary artery disease, status post stent placement for NSTEMI in 2022.  Currently not on a blood thinner. Pulmonary nodules Plan  Patient is scheduled for robotic assisted laparoscopic partial colectomy on 10/07/2022.  Will get cardiac clearance prior to surgery.  The risks and benefits of the procedure including bleeding, infection, anastomotic leak, and the possibility of a blood transfusion were fully explained to the patient, who gave informed consent.  Sutabs, neomycin, and Flagyl have been ordered for preoperative bowel prep.

## 2022-09-29 DIAGNOSIS — I2510 Atherosclerotic heart disease of native coronary artery without angina pectoris: Secondary | ICD-10-CM | POA: Diagnosis not present

## 2022-09-29 NOTE — Telephone Encounter (Signed)
Patient has cardiology appointment 09/29/2022.

## 2022-09-30 NOTE — Telephone Encounter (Signed)
Patient cleared for surgery as follows:   Retia Passe, NP   Patient is stable from a cardiovascular standpoint while has no angina or an equivalent symptoms. Patient is able to achieve greater than 4 METS of workload and can proceed with surgery accordingly with a low to moderate risk for cardiovascular event. Patient verbalized understanding to the cardiovascular risk and had no further questions upon departure. Continue medication regimen as prescribed. Continuous telemetry monitoring during the perioperative and postoperative phase of care.

## 2022-10-03 NOTE — Patient Instructions (Signed)
Vincent Griffin  10/03/2022     @PREFPERIOPPHARMACY @   Your procedure is scheduled on  10/07/2022.   Report to [facility] at  0940  A.M.   Call this number if you have problems the morning of surgery:  [phone]  If you experience any cold or flu symptoms such as cough, fever, chills, shortness of breath, etc. between now and your scheduled surgery, please notify us at the above number.   Remember:   Follow the diet and prep instructions given to you by Dr Lovell Sheehan.     At 10 pm 10/06/2022 drink 2 bottles of carb drink.    You may drink clear liquids until  0540 am on 10/07/2022.       Clear liquids allowed are:                    Water, Juice (No red color; non-citric and without pulp; diabetics please choose diet or no sugar options), Carbonated beverages (diabetics please choose diet or no sugar options), Clear Tea (No creamer, milk, or cream, including half & half and powdered creamer), Black Coffee Only (No creamer, milk or cream, including half & half and powdered creamer), Plain Jell-O Only (No red color; diabetics please choose no sugar options), Clear Sports drink (No red color; diabetics please choose diet or no sugar options), and Plain Popsicles Only (No red color; diabetics please choose no sugar options)        At 0540 am on 10/07/2022 drink 1 carb drink. You can have nothing else to drink after this.    Take these medicines the morning of surgery with A SIP OF WATER                                        metoprolol.     Do not wear jewelry, make-up or nail polish.  Do not wear lotions, powders, or perfumes, or deodorant.  Do not shave 48 hours prior to surgery.  Men may shave face and neck.  Do not bring valuables to the hospital.  [facility] is not responsible for any belongings or valuables.  Contacts, dentures or bridgework may not be worn into surgery.  Leave your suitcase in the car.  After surgery it may be brought to your room.  For patients  admitted to the hospital, discharge time will be determined by your treatment team.  Patients discharged the day of surgery will not be allowed to drive home and must have someone with them for 24 hours.    Special instructions:   DO NOT smoke tobacco or vape for 24 hours before your procedure.  Please read over the following fact sheets that you were given. Pain Booklet, Coughing and Deep Breathing, Blood Transfusion Information, Surgical Site Infection Prevention, Anesthesia Post-op Instructions, and Care and Recovery After Surgery        Minimally Invasive Partial Colectomy, Adult, Care After The following information offers guidance on how to care for yourself after your procedure. Your health care provider may also give you more specific instructions. If you have problems or questions, contact your health care provider. What can I expect after the surgery? After the procedure, it is common to have: Pain, bruising, and swelling. Bloating. Weakness and tiredness (fatigue). Changes to your bowel movements, especially having bowel movements more often. Follow these instructions at home: Medicines  Take over-the-counter and prescription medicines only as told by your health care provider. If you were prescribed an antibiotic medicine, take it as told by your health care provider. Do not stop using the antibiotic even if you start to feel better. Ask your health care provider if the medicine prescribed to you: Requires you to avoid driving or using machinery. Can cause constipation. You may need to take these actions to prevent or treat constipation: Drink enough fluids to keep your urine pale yellow. Take over-the-counter or prescription medicines. Limit foods that are high in fat and processed sugars, such as fried or sweet foods. Eating and drinking Follow instructions from your health care provider about what you may eat and drink. Do not drink alcohol if your health care provider  tells you not to drink. Eat a low-fiber diet for the first 4 weeks after surgery or as told by your health care provider.. Most people on a low-fiber eating plan should eat less than 10 grams (g) of fiber a day. Follow recommendations from your health care provider or dietitian about how much fiber you should have each day. Always check food labels to know the fiber content of packaged foods. In general, a low-fiber food will have fewer than 2 g of fiber per serving. In general, try to avoid whole grains, raw fruits and vegetables, dried fruit, tough cuts of meat, nuts, and seeds. Incision care  Follow instructions from your health care provider about how to take care of your incisions. Make sure you: Wash your hands with soap and water for at least 20 seconds before and after you change your bandage (dressing). If soap and water are not available, use hand sanitizer. Change your dressing as told by your health care provider. Leave stitches (sutures), skin glue, or adhesive strips in place. These skin closures may need to stay in place for 2 weeks or longer. If adhesive strip edges start to loosen and curl up, you may trim the loose edges. Do not remove adhesive strips completely unless your health care provider tells you to do that. Check your incision area every day for signs of infection. Check for: More redness, swelling, or pain. Fluid or blood. Warmth. Pus or a bad smell. Activity Rest as told by your health care provider. Avoid sitting for a long time without moving. Get up to take short walks every 1-2 hours. This is important to improve blood flow and breathing. Ask for help if you feel weak or unsteady. You may have to avoid lifting. Ask your health care provider how much you can safely lift. Return to your normal activities as told by your health care provider. Ask your health care provider what activities are safe for you. General instructions Do not use any products that contain  nicotine or tobacco. These products include cigarettes, chewing tobacco, and vaping devices, such as e-cigarettes. If you need help quitting, ask your health care provider. Do not take baths, swim, or use a hot tub until your health care provider approves. Ask your health care provider if you may take showers. You may only be allowed to take sponge baths. Wear compression stockings as told by your health care provider. These stockings help to prevent blood clots and reduce swelling in your legs. Keep all follow-up visits. This is important to monitor healing and check for any complications. Contact a health care provider if: Medicine is not controlling your pain. You have chills or fever. You have any signs of infection in your  incision areas. You have a persistent cough. You have nausea or vomiting. You develop a rash. You have not had a bowel movement in 3 days. Get help right away if: You have severe pain. Your incisions break open after sutures or staples have been removed. You are bleeding from your rectum or have blood in your stool. You have a warm, tender swelling in your leg. You have chest pain or trouble breathing. You have increased swelling in the abdomen. You feel Barringer-headed or you faint. These symptoms may be an emergency. Get help right away. Call 911. Do not wait to see if the symptoms will go away. Do not drive yourself to the hospital. Summary After surgery, it is common to have some pain, bruising, swelling, bloating, tiredness, weakness, or changes to your bowel movements. Follow instructions from your health care provider about what to eat and drink. Return to your normal activities as told by your health care provider. Check your incision area every day for signs of infection. Get help right away if you have chest pain or trouble breathing. This information is not intended to replace advice given to you by your health care provider. Make sure you discuss any  questions you have with your health care provider. Document Revised: 08/18/2021 Document Reviewed: 08/18/2021 Elsevier Patient Education  2023 [employer] General Anesthesia, Adult, Care After The following information offers guidance on how to care for yourself after your procedure. Your health care provider may also give you more specific instructions. If you have problems or questions, contact your health care provider. What can I expect after the procedure? After the procedure, it is common for people to: Have pain or discomfort at the IV site. Have nausea or vomiting. Have a sore throat or hoarseness. Have trouble concentrating. Feel cold or chills. Feel weak, sleepy, or tired (fatigue). Have soreness and body aches. These can affect parts of the body that were not involved in surgery. Follow these instructions at home: For the time period you were told by your health care provider:  Rest. Do not participate in activities where you could fall or become injured. Do not drive or use machinery. Do not drink alcohol. Do not take sleeping pills or medicines that cause drowsiness. Do not make important decisions or sign legal documents. Do not take care of children on your own. General instructions Drink enough fluid to keep your urine pale yellow. If you have sleep apnea, surgery and certain medicines can increase your risk for breathing problems. Follow instructions from your health care provider about wearing your sleep device: Anytime you are sleeping, including during daytime naps. While taking prescription pain medicines, sleeping medicines, or medicines that make you drowsy. Return to your normal activities as told by your health care provider. Ask your health care provider what activities are safe for you. Take over-the-counter and prescription medicines only as told by your health care provider. Do not use any products that contain nicotine or tobacco. These products include  cigarettes, chewing tobacco, and vaping devices, such as e-cigarettes. These can delay incision healing after surgery. If you need help quitting, ask your health care provider. Contact a health care provider if: You have nausea or vomiting that does not get better with medicine. You vomit every time you eat or drink. You have pain that does not get better with medicine. You cannot urinate or have bloody urine. You develop a skin rash. You have a fever. Get help right away if: You have trouble breathing.  You have chest pain. You vomit blood. These symptoms may be an emergency. Get help right away. Call 911. Do not wait to see if the symptoms will go away. Do not drive yourself to the hospital. Summary After the procedure, it is common to have a sore throat, hoarseness, nausea, vomiting, or to feel weak, sleepy, or fatigue. For the time period you were told by your health care provider, do not drive or use machinery. Get help right away if you have difficulty breathing, have chest pain, or vomit blood. These symptoms may be an emergency. This information is not intended to replace advice given to you by your health care provider. Make sure you discuss any questions you have with your health care provider. Document Revised: 07/30/2021 Document Reviewed: 07/30/2021 Elsevier Patient Education  2023 [employer]. How to Use Chlorhexidine Before Surgery Chlorhexidine gluconate (CHG) is a germ-killing (antiseptic) solution that is used to clean the skin. It can get rid of the bacteria that normally live on the skin and can keep them away for about 24 hours. To clean your skin with CHG, you may be given: A CHG solution to use in the shower or as part of a sponge bath. A prepackaged cloth that contains CHG. Cleaning your skin with CHG may help lower the risk for infection: While you are staying in the intensive care unit of the hospital. If you have a vascular access, such as a central line, to  provide short-term or long-term access to your veins. If you have a catheter to drain urine from your bladder. If you are on a ventilator. A ventilator is a machine that helps you breathe by moving air in and out of your lungs. After surgery. What are the risks? Risks of using CHG include: A skin reaction. Hearing loss, if CHG gets in your ears and you have a perforated eardrum. Eye injury, if CHG gets in your eyes and is not rinsed out. The CHG product catching fire. Make sure that you avoid smoking and flames after applying CHG to your skin. Do not use CHG: If you have a chlorhexidine allergy or have previously reacted to chlorhexidine. On babies younger than 67 months of age. How to use CHG solution Use CHG only as told by your health care provider, and follow the instructions on the label. Use the full amount of CHG as directed. Usually, this is one bottle. During a shower Follow these steps when using CHG solution during a shower (unless your health care provider gives you different instructions): Start the shower. Use your normal soap and shampoo to wash your face and hair. Turn off the shower or move out of the shower stream. Pour the CHG onto a clean washcloth. Do not use any type of brush or rough-edged sponge. Starting at your neck, lather your body down to your toes. Make sure you follow these instructions: If you will be having surgery, pay special attention to the part of your body where you will be having surgery. Scrub this area for at least 1 minute. Do not use CHG on your head or face. If the solution gets into your ears or eyes, rinse them well with water. Avoid your genital area. Avoid any areas of skin that have broken skin, cuts, or scrapes. Scrub your back and under your arms. Make sure to wash skin folds. Let the lather sit on your skin for 1-2 minutes or as long as told by your health care provider. Thoroughly rinse your entire  body in the shower. Make sure that  all body creases and crevices are rinsed well. Dry off with a clean towel. Do not put any substances on your body afterward--such as powder, lotion, or perfume--unless you are told to do so by your health care provider. Only use lotions that are recommended by the manufacturer. Put on clean clothes or pajamas. If it is the night before your surgery, sleep in clean sheets.  During a sponge bath Follow these steps when using CHG solution during a sponge bath (unless your health care provider gives you different instructions): Use your normal soap and shampoo to wash your face and hair. Pour the CHG onto a clean washcloth. Starting at your neck, lather your body down to your toes. Make sure you follow these instructions: If you will be having surgery, pay special attention to the part of your body where you will be having surgery. Scrub this area for at least 1 minute. Do not use CHG on your head or face. If the solution gets into your ears or eyes, rinse them well with water. Avoid your genital area. Avoid any areas of skin that have broken skin, cuts, or scrapes. Scrub your back and under your arms. Make sure to wash skin folds. Let the lather sit on your skin for 1-2 minutes or as long as told by your health care provider. Using a different clean, wet washcloth, thoroughly rinse your entire body. Make sure that all body creases and crevices are rinsed well. Dry off with a clean towel. Do not put any substances on your body afterward--such as powder, lotion, or perfume--unless you are told to do so by your health care provider. Only use lotions that are recommended by the manufacturer. Put on clean clothes or pajamas. If it is the night before your surgery, sleep in clean sheets. How to use CHG prepackaged cloths Only use CHG cloths as told by your health care provider, and follow the instructions on the label. Use the CHG cloth on clean, dry skin. Do not use the CHG cloth on your head or face  unless your health care provider tells you to. When washing with the CHG cloth: Avoid your genital area. Avoid any areas of skin that have broken skin, cuts, or scrapes. Before surgery Follow these steps when using a CHG cloth to clean before surgery (unless your health care provider gives you different instructions): Using the CHG cloth, vigorously scrub the part of your body where you will be having surgery. Scrub using a back-and-forth motion for 3 minutes. The area on your body should be completely wet with CHG when you are done scrubbing. Do not rinse. Discard the cloth and let the area air-dry. Do not put any substances on the area afterward, such as powder, lotion, or perfume. Put on clean clothes or pajamas. If it is the night before your surgery, sleep in clean sheets.  For general bathing Follow these steps when using CHG cloths for general bathing (unless your health care provider gives you different instructions). Use a separate CHG cloth for each area of your body. Make sure you wash between any folds of skin and between your fingers and toes. Wash your body in the following order, switching to a new cloth after each step: The front of your neck, shoulders, and chest. Both of your arms, under your arms, and your hands. Your stomach and groin area, avoiding the genitals. Your right leg and foot. Your left leg and foot. The back  of your neck, your back, and your buttocks. Do not rinse. Discard the cloth and let the area air-dry. Do not put any substances on your body afterward--such as powder, lotion, or perfume--unless you are told to do so by your health care provider. Only use lotions that are recommended by the manufacturer. Put on clean clothes or pajamas. Contact a health care provider if: Your skin gets irritated after scrubbing. You have questions about using your solution or cloth. You swallow any chlorhexidine. Call your local poison control center ([phone] in the  [place] Get help right away if: Your eyes itch badly, or they become very red or swollen. Your skin itches badly and is red or swollen. Your hearing changes. You have trouble seeing. You have swelling or tingling in your mouth or throat. You have trouble breathing. These symptoms may represent a serious problem that is an emergency. Do not wait to see if the symptoms will go away. Get medical help right away. Call your local emergency services (911 in the [place] Do not drive yourself to the hospital. Summary Chlorhexidine gluconate (CHG) is a germ-killing (antiseptic) solution that is used to clean the skin. Cleaning your skin with CHG may help to lower your risk for infection. You may be given CHG to use for bathing. It may be in a bottle or in a prepackaged cloth to use on your skin. Carefully follow your health care provider's instructions and the instructions on the product label. Do not use CHG if you have a chlorhexidine allergy. Contact your health care provider if your skin gets irritated after scrubbing. This information is not intended to replace advice given to you by your health care provider. Make sure you discuss any questions you have with your health care provider. Document Revised: 08/30/2021 Document Reviewed: 07/13/2020 Elsevier Patient Education  2023 [employer].

## 2022-10-05 ENCOUNTER — Encounter (HOSPITAL_COMMUNITY)
Admission: RE | Admit: 2022-10-05 | Discharge: 2022-10-05 | Disposition: A | Discharge status: Home / Self Care | Payer: BC Managed Care – PPO | Source: Ambulatory Visit | Attending: General Surgery | Admitting: General Surgery

## 2022-10-05 ENCOUNTER — Encounter (HOSPITAL_COMMUNITY): Payer: Self-pay

## 2022-10-05 ENCOUNTER — Ambulatory Visit (HOSPITAL_COMMUNITY)
Admission: RE | Admit: 2022-10-05 | Discharge: 2022-10-05 | Disposition: A | Discharge status: Home / Self Care | Payer: BC Managed Care – PPO | Source: Ambulatory Visit | Attending: General Surgery | Admitting: General Surgery

## 2022-10-05 DIAGNOSIS — I252 Old myocardial infarction: Secondary | ICD-10-CM | POA: Diagnosis not present

## 2022-10-05 DIAGNOSIS — C182 Malignant neoplasm of ascending colon: Secondary | ICD-10-CM

## 2022-10-05 DIAGNOSIS — I129 Hypertensive chronic kidney disease with stage 1 through stage 4 chronic kidney disease, or unspecified chronic kidney disease: Secondary | ICD-10-CM | POA: Diagnosis not present

## 2022-10-05 DIAGNOSIS — E7800 Pure hypercholesterolemia, unspecified: Secondary | ICD-10-CM | POA: Diagnosis not present

## 2022-10-05 DIAGNOSIS — Z955 Presence of coronary angioplasty implant and graft: Secondary | ICD-10-CM | POA: Diagnosis not present

## 2022-10-05 DIAGNOSIS — F17210 Nicotine dependence, cigarettes, uncomplicated: Secondary | ICD-10-CM | POA: Diagnosis not present

## 2022-10-05 DIAGNOSIS — N189 Chronic kidney disease, unspecified: Secondary | ICD-10-CM | POA: Diagnosis not present

## 2022-10-05 DIAGNOSIS — C784 Secondary malignant neoplasm of small intestine: Secondary | ICD-10-CM | POA: Diagnosis not present

## 2022-10-05 DIAGNOSIS — Z87442 Personal history of urinary calculi: Secondary | ICD-10-CM | POA: Diagnosis not present

## 2022-10-05 DIAGNOSIS — Z833 Family history of diabetes mellitus: Secondary | ICD-10-CM | POA: Diagnosis not present

## 2022-10-05 DIAGNOSIS — N182 Chronic kidney disease, stage 2 (mild): Secondary | ICD-10-CM | POA: Diagnosis not present

## 2022-10-05 DIAGNOSIS — Z888 Allergy status to other drugs, medicaments and biological substances status: Secondary | ICD-10-CM | POA: Diagnosis not present

## 2022-10-05 DIAGNOSIS — I2510 Atherosclerotic heart disease of native coronary artery without angina pectoris: Secondary | ICD-10-CM | POA: Diagnosis not present

## 2022-10-05 DIAGNOSIS — Z5331 Laparoscopic surgical procedure converted to open procedure: Secondary | ICD-10-CM | POA: Diagnosis not present

## 2022-10-05 DIAGNOSIS — Z6841 Body Mass Index (BMI) 40.0 and over, adult: Secondary | ICD-10-CM | POA: Diagnosis not present

## 2022-10-05 DIAGNOSIS — C180 Malignant neoplasm of cecum: Secondary | ICD-10-CM | POA: Diagnosis not present

## 2022-10-05 DIAGNOSIS — C189 Malignant neoplasm of colon, unspecified: Secondary | ICD-10-CM | POA: Diagnosis not present

## 2022-10-05 DIAGNOSIS — Z83719 Family history of colon polyps, unspecified: Secondary | ICD-10-CM | POA: Diagnosis not present

## 2022-10-05 DIAGNOSIS — R918 Other nonspecific abnormal finding of lung field: Secondary | ICD-10-CM | POA: Diagnosis present

## 2022-10-05 DIAGNOSIS — Z823 Family history of stroke: Secondary | ICD-10-CM | POA: Diagnosis not present

## 2022-10-05 DIAGNOSIS — I10 Essential (primary) hypertension: Secondary | ICD-10-CM | POA: Diagnosis not present

## 2022-10-05 DIAGNOSIS — Z79899 Other long term (current) drug therapy: Secondary | ICD-10-CM | POA: Diagnosis not present

## 2022-10-05 DIAGNOSIS — Z7982 Long term (current) use of aspirin: Secondary | ICD-10-CM | POA: Diagnosis not present

## 2022-10-05 HISTORY — DX: Essential (primary) hypertension: I10

## 2022-10-05 HISTORY — DX: Personal history of urinary calculi: Z87.442

## 2022-10-05 LAB — CBC WITH DIFFERENTIAL/PLATELET
Abs Immature Granulocytes: 0.04 10*3/uL (ref 0.00–0.07)
Basophils Absolute: 0.1 10*3/uL (ref 0.0–0.1)
Basophils Relative: 1 %
Eosinophils Absolute: 0.4 10*3/uL (ref 0.0–0.5)
Eosinophils Relative: 4 %
HCT: 42.4 % (ref 39.0–52.0)
Hemoglobin: 13.7 g/dL (ref 13.0–17.0)
Immature Granulocytes: 0 %
Lymphocytes Relative: 16 %
Lymphs Abs: 1.5 10*3/uL (ref 0.7–4.0)
MCH: 31.2 pg (ref 26.0–34.0)
MCHC: 32.3 g/dL (ref 30.0–36.0)
MCV: 96.6 fL (ref 80.0–100.0)
Monocytes Absolute: 0.7 10*3/uL (ref 0.1–1.0)
Monocytes Relative: 7 %
Neutro Abs: 6.6 10*3/uL (ref 1.7–7.7)
Neutrophils Relative %: 72 %
Platelets: 298 10*3/uL (ref 150–400)
RBC: 4.39 MIL/uL (ref 4.22–5.81)
RDW: 14.4 % (ref 11.5–15.5)
WBC: 9.2 10*3/uL (ref 4.0–10.5)
nRBC: 0 % (ref 0.0–0.2)

## 2022-10-05 LAB — TYPE AND SCREEN
ABO/RH(D): O POS
Antibody Screen: NEGATIVE

## 2022-10-05 LAB — COMPREHENSIVE METABOLIC PANEL
ALT: 23 U/L (ref 0–44)
AST: 25 U/L (ref 15–41)
Albumin: 3 g/dL — ABNORMAL LOW (ref 3.5–5.0)
Alkaline Phosphatase: 97 U/L (ref 38–126)
Anion gap: 9 (ref 5–15)
BUN: 13 mg/dL (ref 6–20)
CO2: 26 mmol/L (ref 22–32)
Calcium: 8.4 mg/dL — ABNORMAL LOW (ref 8.9–10.3)
Chloride: 100 mmol/L (ref 98–111)
Creatinine, Ser: 0.98 mg/dL (ref 0.61–1.24)
GFR, Estimated: >60 mL/min (ref >60)
Glucose, Bld: 100 mg/dL — ABNORMAL HIGH (ref 70–99)
Potassium: 3.9 mmol/L (ref 3.5–5.1)
Sodium: 135 mmol/L (ref 135–145)
Total Bilirubin: 0.4 mg/dL (ref 0.3–1.2)
Total Protein: 7.3 g/dL (ref 6.5–8.1)

## 2022-10-05 NOTE — Progress Notes (Signed)
   10/05/22 0846  OBSTRUCTIVE SLEEP APNEA  Have you ever been diagnosed with sleep apnea through a sleep study? No  Do you snore loudly (loud enough to be heard through closed doors)?  1  Do you often feel tired, fatigued, or sleepy during the daytime (such as falling asleep during driving or talking to someone)? 0  Has anyone observed you stop breathing during your sleep? 0  Do you have, or are you being treated for high blood pressure? 1  BMI more than 35 kg/m2? 1  Age > 50 (1-yes) 1  Male Gender (Yes=1) 1  Obstructive Sleep Apnea Score 5  Score 5 or greater  Results sent to PCP

## 2022-10-07 ENCOUNTER — Other Ambulatory Visit: Payer: Self-pay

## 2022-10-07 ENCOUNTER — Encounter (HOSPITAL_COMMUNITY)
Admission: RE | Disposition: A | Discharge status: Home / Self Care | Payer: Self-pay | Source: Home / Self Care | Attending: General Surgery

## 2022-10-07 ENCOUNTER — Inpatient Hospital Stay (HOSPITAL_COMMUNITY): Payer: BC Managed Care – PPO | Admitting: Certified Registered Nurse Anesthetist

## 2022-10-07 ENCOUNTER — Encounter (HOSPITAL_COMMUNITY): Payer: Self-pay | Admitting: General Surgery

## 2022-10-07 ENCOUNTER — Inpatient Hospital Stay (HOSPITAL_COMMUNITY)
Admission: RE | Admit: 2022-10-07 | Discharge: 2022-10-10 | DRG: 330 | Disposition: A | Discharge status: Home / Self Care | Payer: BC Managed Care – PPO | Attending: General Surgery | Admitting: General Surgery

## 2022-10-07 DIAGNOSIS — Z5331 Laparoscopic surgical procedure converted to open procedure: Secondary | ICD-10-CM | POA: Diagnosis not present

## 2022-10-07 DIAGNOSIS — I2510 Atherosclerotic heart disease of native coronary artery without angina pectoris: Secondary | ICD-10-CM | POA: Diagnosis present

## 2022-10-07 DIAGNOSIS — E7800 Pure hypercholesterolemia, unspecified: Secondary | ICD-10-CM | POA: Diagnosis present

## 2022-10-07 DIAGNOSIS — Z87442 Personal history of urinary calculi: Secondary | ICD-10-CM

## 2022-10-07 DIAGNOSIS — R918 Other nonspecific abnormal finding of lung field: Secondary | ICD-10-CM | POA: Diagnosis present

## 2022-10-07 DIAGNOSIS — Z7982 Long term (current) use of aspirin: Secondary | ICD-10-CM

## 2022-10-07 DIAGNOSIS — I252 Old myocardial infarction: Secondary | ICD-10-CM | POA: Diagnosis not present

## 2022-10-07 DIAGNOSIS — Z83719 Family history of colon polyps, unspecified: Secondary | ICD-10-CM

## 2022-10-07 DIAGNOSIS — Z79899 Other long term (current) drug therapy: Secondary | ICD-10-CM

## 2022-10-07 DIAGNOSIS — N182 Chronic kidney disease, stage 2 (mild): Secondary | ICD-10-CM | POA: Diagnosis present

## 2022-10-07 DIAGNOSIS — Z888 Allergy status to other drugs, medicaments and biological substances status: Secondary | ICD-10-CM | POA: Diagnosis not present

## 2022-10-07 DIAGNOSIS — Z833 Family history of diabetes mellitus: Secondary | ICD-10-CM | POA: Diagnosis not present

## 2022-10-07 DIAGNOSIS — Z6841 Body Mass Index (BMI) 40.0 and over, adult: Secondary | ICD-10-CM | POA: Diagnosis not present

## 2022-10-07 DIAGNOSIS — C182 Malignant neoplasm of ascending colon: Secondary | ICD-10-CM | POA: Diagnosis not present

## 2022-10-07 DIAGNOSIS — Z823 Family history of stroke: Secondary | ICD-10-CM | POA: Diagnosis not present

## 2022-10-07 DIAGNOSIS — C180 Malignant neoplasm of cecum: Principal | ICD-10-CM | POA: Diagnosis present

## 2022-10-07 DIAGNOSIS — Z9049 Acquired absence of other specified parts of digestive tract: Secondary | ICD-10-CM

## 2022-10-07 DIAGNOSIS — C784 Secondary malignant neoplasm of small intestine: Secondary | ICD-10-CM | POA: Diagnosis present

## 2022-10-07 DIAGNOSIS — Z955 Presence of coronary angioplasty implant and graft: Secondary | ICD-10-CM | POA: Diagnosis not present

## 2022-10-07 DIAGNOSIS — N189 Chronic kidney disease, unspecified: Secondary | ICD-10-CM | POA: Diagnosis present

## 2022-10-07 DIAGNOSIS — I129 Hypertensive chronic kidney disease with stage 1 through stage 4 chronic kidney disease, or unspecified chronic kidney disease: Secondary | ICD-10-CM | POA: Diagnosis present

## 2022-10-07 DIAGNOSIS — F17210 Nicotine dependence, cigarettes, uncomplicated: Secondary | ICD-10-CM | POA: Diagnosis present

## 2022-10-07 HISTORY — PX: ROBOT ASSISTED LAPAROSCOPIC PARTIAL COLECTOMY: SHX6476

## 2022-10-07 LAB — ABO/RH: ABO/RH(D): O POS

## 2022-10-07 LAB — CEA: CEA: 42.1 ng/mL — ABNORMAL HIGH (ref 0.0–4.7)

## 2022-10-07 SURGERY — COLECTOMY, PARTIAL, ROBOT-ASSISTED, LAPAROSCOPIC
Anesthesia: General | Site: Abdomen

## 2022-10-07 MED ORDER — KETOROLAC TROMETHAMINE 30 MG/ML IJ SOLN
30.0000 mg | Freq: Four times a day (QID) | INTRAMUSCULAR | Status: AC
  Administered 2022-10-07: 30 mg via INTRAVENOUS
  Filled 2022-10-07: qty 1

## 2022-10-07 MED ORDER — ALBUMIN HUMAN 5 % IV SOLN: INTRAVENOUS | Status: DC | PRN

## 2022-10-07 MED ORDER — MIDAZOLAM HCL 5 MG/5ML IJ SOLN
INTRAMUSCULAR | Status: DC | PRN
  Administered 2022-10-07: 2 mg via INTRAVENOUS

## 2022-10-07 MED ORDER — LIDOCAINE HCL (PF) 2 % IJ SOLN
INTRAMUSCULAR | Status: AC
  Filled 2022-10-07: qty 5

## 2022-10-07 MED ORDER — ALBUMIN HUMAN 5 % IV SOLN
12.5000 g | Freq: Once | INTRAVENOUS | Status: AC
  Administered 2022-10-07: 12.5 g via INTRAVENOUS
  Filled 2022-10-07: qty 250

## 2022-10-07 MED ORDER — ONDANSETRON HCL 4 MG/2ML IJ SOLN
INTRAMUSCULAR | Status: DC | PRN
  Administered 2022-10-07: 4 mg via INTRAVENOUS

## 2022-10-07 MED ORDER — ATORVASTATIN CALCIUM 40 MG PO TABS
80.0000 mg | ORAL_TABLET | Freq: Every day | ORAL | Status: DC
  Administered 2022-10-07 – 2022-10-09 (×3): 80 mg via ORAL
  Filled 2022-10-07 (×3): qty 2

## 2022-10-07 MED ORDER — CHLORHEXIDINE GLUCONATE CLOTH 2 % EX PADS
6.0000 | MEDICATED_PAD | Freq: Once | CUTANEOUS | Status: DC
  Administered 2022-10-07: 6 via TOPICAL

## 2022-10-07 MED ORDER — ONDANSETRON HCL 4 MG/2ML IJ SOLN: 4.0000 mg | Freq: Once | INTRAMUSCULAR | Status: DC | PRN

## 2022-10-07 MED ORDER — PHENYLEPHRINE 80 MCG/ML (10ML) SYRINGE FOR IV PUSH (FOR BLOOD PRESSURE SUPPORT)
PREFILLED_SYRINGE | INTRAVENOUS | Status: AC
  Filled 2022-10-07: qty 10

## 2022-10-07 MED ORDER — FENTANYL CITRATE (PF) 250 MCG/5ML IJ SOLN
INTRAMUSCULAR | Status: DC | PRN
  Administered 2022-10-07: 50 ug via INTRAVENOUS
  Administered 2022-10-07: 25 ug via INTRAVENOUS
  Administered 2022-10-07: 50 ug via INTRAVENOUS
  Administered 2022-10-07: 100 ug via INTRAVENOUS
  Administered 2022-10-07: 25 ug via INTRAVENOUS

## 2022-10-07 MED ORDER — PANTOPRAZOLE SODIUM 40 MG PO TBEC
40.0000 mg | DELAYED_RELEASE_TABLET | Freq: Every day | ORAL | Status: DC
  Administered 2022-10-08 – 2022-10-10 (×3): 40 mg via ORAL
  Filled 2022-10-07 (×3): qty 1

## 2022-10-07 MED ORDER — DIPHENHYDRAMINE HCL 50 MG/ML IJ SOLN: 25.0000 mg | Freq: Four times a day (QID) | INTRAMUSCULAR | Status: DC | PRN

## 2022-10-07 MED ORDER — ACETAMINOPHEN 10 MG/ML IV SOLN
INTRAVENOUS | Status: AC
  Filled 2022-10-07: qty 100

## 2022-10-07 MED ORDER — SODIUM CHLORIDE 0.9 % IV SOLN
INTRAVENOUS | Status: AC
  Filled 2022-10-07: qty 2

## 2022-10-07 MED ORDER — LIDOCAINE 2% (20 MG/ML) 5 ML SYRINGE
INTRAMUSCULAR | Status: DC | PRN
  Administered 2022-10-07: 80 mg via INTRAVENOUS

## 2022-10-07 MED ORDER — ACETAMINOPHEN 325 MG PO TABS: 650.0000 mg | ORAL_TABLET | Freq: Four times a day (QID) | ORAL | Status: DC | PRN

## 2022-10-07 MED ORDER — HYDROMORPHONE HCL 1 MG/ML IJ SOLN
0.2500 mg | INTRAMUSCULAR | Status: DC | PRN
  Administered 2022-10-07: 0.5 mg via INTRAVENOUS
  Filled 2022-10-07: qty 0.5

## 2022-10-07 MED ORDER — ORAL CARE MOUTH RINSE: 15.0000 mL | Freq: Once | OROMUCOSAL | Status: AC

## 2022-10-07 MED ORDER — FENTANYL CITRATE (PF) 250 MCG/5ML IJ SOLN
INTRAMUSCULAR | Status: AC
  Filled 2022-10-07: qty 5

## 2022-10-07 MED ORDER — METOPROLOL SUCCINATE ER 25 MG PO TB24
25.0000 mg | ORAL_TABLET | Freq: Every day | ORAL | Status: DC
  Administered 2022-10-08 – 2022-10-10 (×3): 25 mg via ORAL
  Filled 2022-10-07 (×3): qty 1

## 2022-10-07 MED ORDER — DEXAMETHASONE SODIUM PHOSPHATE 10 MG/ML IJ SOLN
INTRAMUSCULAR | Status: AC
  Filled 2022-10-07: qty 1

## 2022-10-07 MED ORDER — ACETAMINOPHEN 10 MG/ML IV SOLN
INTRAVENOUS | Status: DC | PRN
  Administered 2022-10-07: 1000 mg via INTRAVENOUS

## 2022-10-07 MED ORDER — ACETAMINOPHEN 650 MG RE SUPP: 650.0000 mg | Freq: Four times a day (QID) | RECTAL | Status: DC | PRN

## 2022-10-07 MED ORDER — LACTATED RINGERS IV SOLN
INTRAVENOUS | Status: DC
  Administered 2022-10-07: 1000 mL via INTRAVENOUS

## 2022-10-07 MED ORDER — ENOXAPARIN SODIUM 40 MG/0.4ML IJ SOSY
40.0000 mg | PREFILLED_SYRINGE | INTRAMUSCULAR | Status: DC
  Administered 2022-10-08 – 2022-10-09 (×2): 40 mg via SUBCUTANEOUS
  Filled 2022-10-07 (×3): qty 0.4

## 2022-10-07 MED ORDER — BUPIVACAINE HCL (PF) 0.5 % IJ SOLN
INTRAMUSCULAR | Status: DC | PRN
  Administered 2022-10-07: 30 mL

## 2022-10-07 MED ORDER — PHENYLEPHRINE HCL-NACL 20-0.9 MG/250ML-% IV SOLN
INTRAVENOUS | Status: DC | PRN
  Administered 2022-10-07: 160 ug via INTRAVENOUS
  Administered 2022-10-07 (×4): 80 ug via INTRAVENOUS

## 2022-10-07 MED ORDER — KETOROLAC TROMETHAMINE 30 MG/ML IJ SOLN
INTRAMUSCULAR | Status: AC
  Filled 2022-10-07: qty 1

## 2022-10-07 MED ORDER — PROPOFOL 10 MG/ML IV BOLUS
INTRAVENOUS | Status: AC
  Filled 2022-10-07: qty 20

## 2022-10-07 MED ORDER — MEPERIDINE HCL 50 MG/ML IJ SOLN: 6.2500 mg | INTRAMUSCULAR | Status: DC | PRN

## 2022-10-07 MED ORDER — ROCURONIUM BROMIDE 10 MG/ML (PF) SYRINGE
PREFILLED_SYRINGE | INTRAVENOUS | Status: AC
  Filled 2022-10-07: qty 10

## 2022-10-07 MED ORDER — DEXMEDETOMIDINE HCL IN NACL 80 MCG/20ML IV SOLN
INTRAVENOUS | Status: DC | PRN
  Administered 2022-10-07: 20 ug via INTRAVENOUS

## 2022-10-07 MED ORDER — BUPIVACAINE HCL (PF) 0.5 % IJ SOLN
INTRAMUSCULAR | Status: AC
  Filled 2022-10-07: qty 30

## 2022-10-07 MED ORDER — LACTATED RINGERS IV SOLN: INTRAVENOUS | Status: DC

## 2022-10-07 MED ORDER — ALVIMOPAN 12 MG PO CAPS
12.0000 mg | ORAL_CAPSULE | Freq: Two times a day (BID) | ORAL | Status: DC
  Administered 2022-10-08 – 2022-10-10 (×4): 12 mg via ORAL
  Filled 2022-10-07 (×5): qty 1

## 2022-10-07 MED ORDER — SIMETHICONE 80 MG PO CHEW
40.0000 mg | CHEWABLE_TABLET | Freq: Four times a day (QID) | ORAL | Status: DC | PRN
  Administered 2022-10-07: 40 mg via ORAL
  Filled 2022-10-07: qty 1

## 2022-10-07 MED ORDER — ROCURONIUM BROMIDE 10 MG/ML (PF) SYRINGE
PREFILLED_SYRINGE | INTRAVENOUS | Status: DC | PRN
  Administered 2022-10-07: 80 mg via INTRAVENOUS
  Administered 2022-10-07: 10 mg via INTRAVENOUS
  Administered 2022-10-07: 20 mg via INTRAVENOUS
  Administered 2022-10-07: 10 mg via INTRAVENOUS
  Administered 2022-10-07: 20 mg via INTRAVENOUS

## 2022-10-07 MED ORDER — ALBUMIN HUMAN 5 % IV SOLN
INTRAVENOUS | Status: AC
  Filled 2022-10-07: qty 250

## 2022-10-07 MED ORDER — OXYCODONE HCL 5 MG PO TABS
5.0000 mg | ORAL_TABLET | ORAL | Status: DC | PRN
  Administered 2022-10-08 – 2022-10-09 (×2): 10 mg via ORAL
  Administered 2022-10-09 – 2022-10-10 (×2): 5 mg via ORAL
  Filled 2022-10-07: qty 1
  Filled 2022-10-07: qty 2
  Filled 2022-10-07: qty 1
  Filled 2022-10-07: qty 2

## 2022-10-07 MED ORDER — MIDAZOLAM HCL 2 MG/2ML IJ SOLN
INTRAMUSCULAR | Status: AC
  Filled 2022-10-07: qty 2

## 2022-10-07 MED ORDER — ONDANSETRON HCL 4 MG/2ML IJ SOLN: 4.0000 mg | Freq: Four times a day (QID) | INTRAMUSCULAR | Status: DC | PRN

## 2022-10-07 MED ORDER — DIPHENHYDRAMINE HCL 25 MG PO CAPS: 25.0000 mg | ORAL_CAPSULE | Freq: Four times a day (QID) | ORAL | Status: DC | PRN

## 2022-10-07 MED ORDER — KETOROLAC TROMETHAMINE 30 MG/ML IJ SOLN: 30.0000 mg | Freq: Four times a day (QID) | INTRAMUSCULAR | Status: DC | PRN

## 2022-10-07 MED ORDER — ONDANSETRON HCL 4 MG/2ML IJ SOLN
INTRAMUSCULAR | Status: AC
  Filled 2022-10-07: qty 2

## 2022-10-07 MED ORDER — LACTATED RINGERS IV BOLUS
1000.0000 mL | Freq: Once | INTRAVENOUS | Status: AC
  Administered 2022-10-07: 1000 mL via INTRAVENOUS

## 2022-10-07 MED ORDER — CHLORHEXIDINE GLUCONATE 0.12 % MT SOLN
15.0000 mL | Freq: Once | OROMUCOSAL | Status: AC
  Administered 2022-10-07: 15 mL via OROMUCOSAL

## 2022-10-07 MED ORDER — SODIUM CHLORIDE 0.9 % IV SOLN
2.0000 g | INTRAVENOUS | Status: AC
  Administered 2022-10-07: 2 g via INTRAVENOUS

## 2022-10-07 MED ORDER — SUGAMMADEX SODIUM 200 MG/2ML IV SOLN
INTRAVENOUS | Status: DC | PRN
  Administered 2022-10-07: 300 mg via INTRAVENOUS

## 2022-10-07 MED ORDER — ONDANSETRON 4 MG PO TBDP: 4.0000 mg | ORAL_TABLET | Freq: Four times a day (QID) | ORAL | Status: DC | PRN

## 2022-10-07 MED ORDER — HYDROMORPHONE HCL 1 MG/ML IJ SOLN
1.0000 mg | INTRAMUSCULAR | Status: DC | PRN
  Administered 2022-10-08 (×2): 1 mg via INTRAVENOUS
  Filled 2022-10-07 (×2): qty 1

## 2022-10-07 MED ORDER — SODIUM CHLORIDE 0.9 % IR SOLN
Status: DC | PRN
  Administered 2022-10-07: 3000 mL

## 2022-10-07 MED ORDER — ESMOLOL HCL 100 MG/10ML IV SOLN
INTRAVENOUS | Status: AC
  Filled 2022-10-07: qty 20

## 2022-10-07 MED ORDER — ALVIMOPAN 12 MG PO CAPS
12.0000 mg | ORAL_CAPSULE | ORAL | Status: AC
  Administered 2022-10-07: 12 mg via ORAL
  Filled 2022-10-07: qty 1

## 2022-10-07 MED ORDER — 0.9 % SODIUM CHLORIDE (POUR BTL) OPTIME
TOPICAL | Status: DC | PRN
  Administered 2022-10-07 (×2): 1000 mL

## 2022-10-07 MED ORDER — KETOROLAC TROMETHAMINE 30 MG/ML IJ SOLN
INTRAMUSCULAR | Status: DC | PRN
  Administered 2022-10-07: 30 mg via INTRAVENOUS

## 2022-10-07 MED ORDER — ALBUMIN HUMAN 5 % IV SOLN
12.5000 g | Freq: Once | INTRAVENOUS | Status: AC
  Administered 2022-10-07: 12.5 g via INTRAVENOUS

## 2022-10-07 MED ORDER — DEXAMETHASONE SODIUM PHOSPHATE 10 MG/ML IJ SOLN
INTRAMUSCULAR | Status: DC | PRN
  Administered 2022-10-07: 10 mg via INTRAVENOUS

## 2022-10-07 MED ORDER — PROPOFOL 10 MG/ML IV BOLUS
INTRAVENOUS | Status: DC | PRN
  Administered 2022-10-07: 150 mg via INTRAVENOUS

## 2022-10-07 SURGICAL SUPPLY — 57 items
CANNULA REDUCER 12-8 DVNC XI (CANNULA) ×1 IMPLANT
COVER LIGHT HANDLE (MISCELLANEOUS) IMPLANT
COVER TIP SHEARS 8 DVNC (MISCELLANEOUS) ×1 IMPLANT
DRAPE ARM DVNC X/XI (DISPOSABLE) ×4 IMPLANT
DRAPE COLUMN DVNC XI (DISPOSABLE) ×1 IMPLANT
DRSG OPSITE POSTOP 4X10 (GAUZE/BANDAGES/DRESSINGS) IMPLANT
DRSG TEGADERM 2-3/8X2-3/4 SM (GAUZE/BANDAGES/DRESSINGS) IMPLANT
ELECT REM PT RETURN 9FT ADLT (ELECTROSURGICAL) ×1
ELECTRODE REM PT RTRN 9FT ADLT (ELECTROSURGICAL) ×1 IMPLANT
FORCEPS BPLR 8 MD DVNC XI (FORCEP) ×1 IMPLANT
GAUZE SPONGE 2X2 STRL 8-PLY (GAUZE/BANDAGES/DRESSINGS) IMPLANT
GLOVE BIO SURGEON STRL SZ7 (GLOVE) IMPLANT
GLOVE BIOGEL PI IND STRL 7.0 (GLOVE) ×5 IMPLANT
GLOVE ECLIPSE 6.5 STRL STRAW (GLOVE) IMPLANT
GLOVE SURG SS PI 7.5 STRL IVOR (GLOVE) ×2 IMPLANT
GOWN STRL REUS W/TWL LRG LVL3 (GOWN DISPOSABLE) ×5 IMPLANT
GRASPER TIP-UP FEN DVNC XI (INSTRUMENTS) ×1 IMPLANT
IRRIGATOR SUCT 8 DISP DVNC XI (IRRIGATION / IRRIGATOR) IMPLANT
IV NS IRRIG 3000ML ARTHROMATIC (IV SOLUTION) IMPLANT
KIT PINK PAD W/HEAD ARE REST (MISCELLANEOUS) ×1
KIT PINK PAD W/HEAD ARM REST (MISCELLANEOUS) IMPLANT
KIT TURNOVER KIT A (KITS) ×1 IMPLANT
LIGASURE IMPACT 36 18CM CVD LR (INSTRUMENTS) ×1 IMPLANT
MANIFOLD NEPTUNE II (INSTRUMENTS) ×1 IMPLANT
NDL HYPO 21X1.5 SAFETY (NEEDLE) IMPLANT
NDL HYPO 25X1 1.5 SAFETY (NEEDLE) IMPLANT
NDL INSUFFLATION 14GA 120MM (NEEDLE) IMPLANT
NEEDLE HYPO 21X1.5 SAFETY (NEEDLE) ×1 IMPLANT
NEEDLE HYPO 25X1 1.5 SAFETY (NEEDLE) ×1 IMPLANT
NEEDLE INSUFFLATION 14GA 120MM (NEEDLE) ×1 IMPLANT
NS IRRIG 1000ML POUR BTL (IV SOLUTION) ×2 IMPLANT
OBTURATOR OPTICAL STND 8 DVNC (TROCAR) ×1
OBTURATOR OPTICALSTD 8 DVNC (TROCAR) ×1 IMPLANT
PACK COLON (CUSTOM PROCEDURE TRAY) ×1 IMPLANT
PENCIL HANDSWITCHING (ELECTRODE) ×1 IMPLANT
RELOAD PROXIMATE 75MM BLUE (ENDOMECHANICALS) ×2 IMPLANT
RELOAD STAPLE 60 3.5 BLU DVNC (STAPLE) IMPLANT
RELOAD STAPLE 75 3.8 BLU REG (ENDOMECHANICALS) ×1 IMPLANT
RELOAD STAPLER 3.5X60 BLU DVNC (STAPLE) ×2 IMPLANT
RETRACTOR WOUND ALXS 18CM MED (MISCELLANEOUS) IMPLANT
RTRCTR WOUND ALEXIS O 18CM MED (MISCELLANEOUS) ×1
SEAL CANN UNIV 5-8 DVNC XI (MISCELLANEOUS) ×3 IMPLANT
SEALER VESSEL EXT DVNC XI (MISCELLANEOUS) IMPLANT
SET TUBE SMOKE EVAC HIGH FLOW (TUBING) IMPLANT
SPONGE T-LAP 18X18 ~~LOC~~+RFID (SPONGE) ×1 IMPLANT
STAPLER 60 SUREFORM DVNC (STAPLE) ×1 IMPLANT
STAPLER CANNULA SEAL DVNC XI (STAPLE) ×1 IMPLANT
STAPLER GUN LINEAR PROX 60 (STAPLE) ×1 IMPLANT
STAPLER PROXIMATE 75MM BLUE (STAPLE) ×1 IMPLANT
STAPLER RELOAD 3.5X60 BLU DVNC (STAPLE) ×2
STAPLER VISISTAT (STAPLE) ×1 IMPLANT
SUT PDS AB 0 CTX 60 (SUTURE) ×2 IMPLANT
SUT SILK 3 0 SH CR/8 (SUTURE) ×1 IMPLANT
SYR 30ML LL (SYRINGE) IMPLANT
TRAY FOLEY MTR SLVR 16FR STAT (SET/KITS/TRAYS/PACK) ×1 IMPLANT
WATER STERILE IRR 500ML POUR (IV SOLUTION) ×1 IMPLANT
YANKAUER SUCT BULB TIP 10FT TU (MISCELLANEOUS) ×1 IMPLANT

## 2022-10-07 NOTE — Anesthesia Procedure Notes (Signed)
Procedure Name: Intubation Date/Time: 10/07/2022 1:08 PM  Performed by: Cy Blamer, CRNAPre-anesthesia Checklist: Patient identified, Emergency Drugs available, Suction available and Patient being monitored Patient Re-evaluated:Patient Re-evaluated prior to induction Oxygen Delivery Method: Circle system utilized Preoxygenation: Pre-oxygenation with 100% oxygen Induction Type: IV induction Ventilation: Mask ventilation without difficulty Laryngoscope Size: Miller and 2 Grade View: Grade I Tube type: Oral Tube size: 7.5 mm Number of attempts: 1 Airway Equipment and Method: Stylet Placement Confirmation: ETT inserted through vocal cords under direct vision, positive ETCO2 and breath sounds checked- equal and bilateral Secured at: 23 cm Tube secured with: Tape Dental Injury: Teeth and Oropharynx as per pre-operative assessment

## 2022-10-07 NOTE — Anesthesia Preprocedure Evaluation (Signed)
Anesthesia Evaluation  Patient identified by MRN, date of birth, ID band Patient awake    Reviewed: Allergy & Precautions, H&P , NPO status , Patient's Chart, lab work & pertinent test results  Airway Mallampati: II  TM Distance: >3 FB Neck ROM: Full    Dental  (+) Missing, Dental Advisory Given, Chipped, Poor Dentition   Pulmonary shortness of breath and with exertion, Current SmokerPatient did not abstain from smoking.   Pulmonary exam normal breath sounds clear to auscultation       Cardiovascular Exercise Tolerance: Poor hypertension, Pt. on medications + CAD, + Past MI, + Cardiac Stents (2 years ago, one stent) and + DOE  Normal cardiovascular exam Rhythm:Regular Rate:Normal     Neuro/Psych negative neurological ROS  negative psych ROS   GI/Hepatic negative GI ROS, Neg liver ROS, Bowel prep,,,  Endo/Other    Morbid obesity  Renal/GU Renal InsufficiencyRenal disease (stones)  negative genitourinary   Musculoskeletal negative musculoskeletal ROS (+)    Abdominal   Peds negative pediatric ROS (+)  Hematology negative hematology ROS (+)   Anesthesia Other Findings   Reproductive/Obstetrics negative OB ROS                             Anesthesia Physical Anesthesia Plan  ASA: 3  Anesthesia Plan: General   Post-op Pain Management: Dilaudid IV   Induction: Intravenous  PONV Risk Score and Plan: 3 and Ondansetron, Dexamethasone and Midazolam  Airway Management Planned: Oral ETT  Additional Equipment:   Intra-op Plan:   Post-operative Plan: Extubation in OR  Informed Consent: I have reviewed the patients History and Physical, chart, labs and discussed the procedure including the risks, benefits and alternatives for the proposed anesthesia with the patient or authorized representative who has indicated his/her understanding and acceptance.     Dental advisory given  Plan  Discussed with: CRNA and Surgeon  Anesthesia Plan Comments:        Anesthesia Quick Evaluation

## 2022-10-07 NOTE — Transfer of Care (Signed)
Immediate Anesthesia Transfer of Care Note  Patient: Vincent Griffin  Procedure(s) Performed: XI ROBOT ASSISTED LAPAROSCOPIC PARTIAL COLECTOMY (Abdomen)  Patient Location: PACU  Anesthesia Type:General  Level of Consciousness: awake and alert   Airway & Oxygen Therapy: Patient Spontanous Breathing and Patient connected to nasal cannula oxygen  Post-op Assessment: Report given to RN and Post -op Vital signs reviewed and stable  Post vital signs: Reviewed and stable  Last Vitals:  Vitals Value Taken Time  BP 104/74   Temp 97.4   Pulse 64 10/07/22 1608  Resp 23 10/07/22 1608  SpO2 95 % 10/07/22 1608  Vitals shown include unvalidated device data.  Last Pain:  Vitals:   10/07/22 1211  TempSrc:   PainSc: 0-No pain         Complications: No notable events documented.

## 2022-10-07 NOTE — Anesthesia Postprocedure Evaluation (Signed)
Anesthesia Post Note  Patient: Vincent Griffin  Procedure(s) Performed: XI ROBOT ASSISTED LAPAROSCOPIC PARTIAL COLECTOMY (Abdomen)  Patient location during evaluation: PACU Anesthesia Type: General Level of consciousness: awake and alert and lethargic Pain management: pain level controlled Vital Signs Assessment: post-procedure vital signs reviewed and stable Respiratory status: spontaneous breathing, nonlabored ventilation and respiratory function stable Cardiovascular status: blood pressure returned to baseline and stable Postop Assessment: no apparent nausea or vomiting Anesthetic complications: no Comments: Patient's systolic blood pressures ware in 80s and 90s, albumin % 500 ml iv and LR 1000 bolus was given, blood pressure 106/71, discharged patient to floor.  No notable events documented.   Last Vitals:  Vitals:   10/07/22 1745 10/07/22 1800  BP: 94/69 106/71  Pulse: (!) 59 60  Resp: 19 16  Temp:    SpO2: 98% 97%    Last Pain:  Vitals:   10/07/22 1800  TempSrc:   PainSc: 0-No pain                 Cecil Bixby C Myrka Sylva

## 2022-10-07 NOTE — Op Note (Signed)
Patient:  Vincent Griffin  DOB:  06/11/1960  MRN:  [id number]   Preop Diagnosis:  Cecal carcinoma  Postop Diagnosis:  Same  Procedure:  Robotic asssted laparoscopic right hemicolectomy  Surgeon:  Franky Macho, MD   Anes:  GET  Indications:  Patient is a 61you WM who was found on colonoscopy to have a cecal carcinoma.  The risks and benefits of the procedure including bleeding, infection, anastomotic leak, the possible need for blood transfusion, and the possibility of cardiopulmonary difficulties were fully explained to the patient, who gave informed consent.  Procedure note: The patient was placed in the supine position.  After induction of general endotracheal anesthesia, the abdomen was prepped and draped using the usual sterile technique with ChloraPrep.  Surgical site confirmation was performed.  An incision was made at Palmer's point.  A Veress needle was introduced into the abdominal cavity and confirmation of placement was done using the saline drop test.  The abdomen was then insufflated to 15 mmHg pressure.  An 8 mm trocar was introduced into the abdominal cavity under direct visualization without difficulty.  Additional 8 mm trocars were placed along the left paramedian line.  The original 8 mm trocar was then upsized to a 12 mm trocar.  The robot was then targeted and docked.  A large mass in the cecum was attached to the abdominal wall.  It appeared to also be scarred down due to previous surgery that the patient had as a child.  The tumor in ascending colon were freed away from the peritoneum along the line of Toldt.  The hepatic flexure was taken down using the LigaSure.  The duodenum was visualized and retracted from the operative field.  The terminal ileum was identified and a blue load stapler was placed across the terminal ileum and fired.  As it was becoming more difficult to take the tumorous mass off the abdominal wall to identify the ureter, I did proceed to finish the case in  an open fashion.  This was all preplanned in order to do an extracorporeal anastomosis.  The robot was undocked.  A midline incision was made around the umbilicus.  The peritoneal cavity was entered into without difficulty.  A GIA 75 stapler was placed across the proximal transverse colon and fired.  The hepatic flexure dissection was then completed along the mesentery of the colon using the LigaSure.  I was able to finally deliver the cecum from its attachments to the abdominal wall.  I was able to palpate the right ureter and kept it free from the operative field.  The specimen was then removed and sent to pathology further examination.  A side-to-side ileocolic anastomosis was performed using a GIA 75 stapler.  The enterotomy was closed using a TA 60 stapler.  The staple line was bolstered using 3-0 silk Lembert sutures.  Surrounding omentum was placed over the anastomosis and secured into place using a 3-0 silk suture.  The bowel was returned into the abdominal cavity in an orderly fashion.  The abdominal cavity was copiously irrigated normal saline.  The mesentery was inspected and no abnormal bleeding was noted.  The bowel was returned into the abdominal cavity in an orderly fashion.  All operating personnel then changed their gown and gloves.  A new set up was used for closure.  The midline fascia was reapproximated using a looped 0 PDS running suture.  0.5% Sensorcaine was instilled into the surrounding wound.  All skin incisions were closed using staples.  Betadine ointment and dry sterile dressings were applied.  All tape and needle counts were correct at the end of the procedure.  The patient was extubated in the operating room and transferred to PACU in stable condition.  Complications: None  EBL: 500 cc  Specimen: Ascending colon

## 2022-10-07 NOTE — Interval H&P Note (Signed)
History and Physical Interval Note:  10/07/2022 11:24 AM  Vincent Griffin  has presented today for surgery, with the diagnosis of CECAL CANCER.  The various methods of treatment have been discussed with the patient and family. After consideration of risks, benefits and other options for treatment, the patient has consented to  Procedure(s): XI ROBOT ASSISTED LAPAROSCOPIC PARTIAL COLECTOMY (N/A) as a surgical intervention.  The patient's history has been reviewed, patient examined, no change in status, stable for surgery.  I have reviewed the patient's chart and labs.  Questions were answered to the patient's satisfaction.     Franky Macho

## 2022-10-07 NOTE — Progress Notes (Signed)
Informed patient and wife Dr. Lovell Sheehan has an emergency surgery that will delay his procedure at this time.

## 2022-10-08 LAB — CBC
HCT: 34.1 % — ABNORMAL LOW (ref 39.0–52.0)
Hemoglobin: 11.1 g/dL — ABNORMAL LOW (ref 13.0–17.0)
MCH: 31.5 pg (ref 26.0–34.0)
MCHC: 32.6 g/dL (ref 30.0–36.0)
MCV: 96.9 fL (ref 80.0–100.0)
Platelets: 249 10*3/uL (ref 150–400)
RBC: 3.52 MIL/uL — ABNORMAL LOW (ref 4.22–5.81)
RDW: 14.4 % (ref 11.5–15.5)
WBC: 11.7 10*3/uL — ABNORMAL HIGH (ref 4.0–10.5)
nRBC: 0 % (ref 0.0–0.2)

## 2022-10-08 LAB — BASIC METABOLIC PANEL
Anion gap: 8 (ref 5–15)
BUN: 11 mg/dL (ref 6–20)
CO2: 24 mmol/L (ref 22–32)
Calcium: 8.1 mg/dL — ABNORMAL LOW (ref 8.9–10.3)
Chloride: 101 mmol/L (ref 98–111)
Creatinine, Ser: 0.91 mg/dL (ref 0.61–1.24)
GFR, Estimated: >60 mL/min (ref >60)
Glucose, Bld: 140 mg/dL — ABNORMAL HIGH (ref 70–99)
Potassium: 4.6 mmol/L (ref 3.5–5.1)
Sodium: 133 mmol/L — ABNORMAL LOW (ref 135–145)

## 2022-10-08 LAB — PHOSPHORUS: Phosphorus: 4.4 mg/dL (ref 2.5–4.6)

## 2022-10-08 LAB — MAGNESIUM: Magnesium: 1.8 mg/dL (ref 1.7–2.4)

## 2022-10-08 MED ORDER — ACETAMINOPHEN 500 MG PO TABS
1000.0000 mg | ORAL_TABLET | Freq: Four times a day (QID) | ORAL | Status: DC
  Administered 2022-10-08 – 2022-10-10 (×7): 1000 mg via ORAL
  Filled 2022-10-08 (×7): qty 2

## 2022-10-08 MED ORDER — METHOCARBAMOL 500 MG PO TABS
500.0000 mg | ORAL_TABLET | Freq: Three times a day (TID) | ORAL | Status: DC
  Administered 2022-10-08 – 2022-10-10 (×5): 500 mg via ORAL
  Filled 2022-10-08 (×5): qty 1

## 2022-10-08 NOTE — Progress Notes (Signed)
Surgeon notified and made aware of post surgical foley remains in place and pt's request "to keep foley in place X 24 additional hours due to increased pain and decreased mobility." Surgeon ordered to remove foley on 5/26 at 6 am . Pt agreed and made aware.

## 2022-10-08 NOTE — Progress Notes (Signed)
[facility] Progress Note  1 Day Post-Op  Subjective: Patient seen and examined.  He is resting comfortably in bed.  He is tolerating his liquids without nausea and vomiting.  He complains of significant abdominal pain, especially when coughing.  He has not gotten out of bed or ambulated since his surgery, as his Foley catheter remains in place.  He requested that the Foley catheter stay in until tomorrow morning.  He denies any flatus, but states he had a small bowel movement with coughing.  Objective: Vital signs in last 24 hours: Temp:  [97.4 F (36.3 C)-98.4 F (36.9 C)] 98.4 F (36.9 C) (05/25 1409) Pulse Rate:  [58-70] 65 (05/25 1409) Resp:  [13-24] 18 (05/25 1409) BP: (79-106)/(56-78) 102/68 (05/25 1409) SpO2:  [89 %-99 %] 94 % (05/25 1409) Weight:  [139.4 kg] 139.4 kg (05/24 1834) Last BM Date : 10/07/22  Intake/Output from previous day: 05/24 0701 - 05/25 0700 In: 4079.9 [I.V.:3629.9; IV Piggyback:450] Out: 1100 [Urine:600; Blood:500] Intake/Output this shift: Total I/O In: 480 [P.O.:480] Out: -   General appearance: alert, cooperative, and no distress GI: Abdomen soft, nondistended, no percussion tenderness, mild incisional tenderness to palpation; no rigidity, guarding, rebound tenderness; midline incision C/D/I with skin staples in place, and honeycomb dressing, mild dried bloody strikethrough on dressing; laparoscopic sites with gauze in place, no strikethrough  Lab Results:  Recent Labs    10/08/22 0504  WBC 11.7*  HGB 11.1*  HCT 34.1*  PLT 249   BMET Recent Labs    10/08/22 0504  NA 133*  K 4.6  CL 101  CO2 24  GLUCOSE 140*  BUN 11  CREATININE 0.91  CALCIUM 8.1*   PT/INR No results for input(s): "LABPROT", "INR" in the last 72 hours.  Studies/Results: No results found.  Anti-infectives: Anti-infectives (From admission, onward)    Start     Dose/Rate Route Frequency Ordered Stop   10/07/22 0930  cefoTEtan (CEFOTAN) 2 g in  sodium chloride 0.9 % 100 mL IVPB        2 g 200 mL/hr over 30 Minutes Intravenous On call to O.R. 10/07/22 [id number] 10/08/22 0734   10/07/22 0930  sodium chloride 0.9 % with cefoTEtan (CEFOTAN) ADS Med       Note to Pharmacy: Laureen Ochs R: cabinet override      10/07/22 0930 10/07/22 1330       Assessment/Plan:  ERAS Protocol Daily Progress Note  POD#1 s/p robotic assisted laparoscopic right hemicolectomy Diet: Full liquids, will plan to advance to soft tomorrow IVF: Currently on LR at 75 cc/h, likely Hep-Lock tomorrow Foley: Maintain Foley until tomorrow morning BM/Flatus: No flatus, patient states small bowel movement with coughing Entereg: Yes, will continue until larger bowel movement Ambulation within 24 hours of surgery: Encouraged patient sitting in chair while Foley remains in place, advised that he will need to start ambulating tomorrow Multimodal pain management: Scheduled Tylenol and Robaxin, as needed Toradol, oxycodone, and Dilaudid Lovenox, SCDs   LOS: 1 day    Kaylene Dawn A Jaley Yan 10/08/2022

## 2022-10-08 NOTE — Plan of Care (Signed)
?  Problem: Activity: ?Goal: Risk for activity intolerance will decrease ?Outcome: Progressing ?  ?Problem: Safety: ?Goal: Ability to remain free from injury will improve ?Outcome: Progressing ?  ?Problem: Pain Managment: ?Goal: General experience of comfort will improve ?Outcome: Progressing ?  ?

## 2022-10-09 LAB — MAGNESIUM: Magnesium: 2 mg/dL (ref 1.7–2.4)

## 2022-10-09 LAB — CBC
HCT: 32.6 % — ABNORMAL LOW (ref 39.0–52.0)
Hemoglobin: 10.3 g/dL — ABNORMAL LOW (ref 13.0–17.0)
MCH: 31.4 pg (ref 26.0–34.0)
MCHC: 31.6 g/dL (ref 30.0–36.0)
MCV: 99.4 fL (ref 80.0–100.0)
Platelets: 230 10*3/uL (ref 150–400)
RBC: 3.28 MIL/uL — ABNORMAL LOW (ref 4.22–5.81)
RDW: 14.3 % (ref 11.5–15.5)
WBC: 12 10*3/uL — ABNORMAL HIGH (ref 4.0–10.5)
nRBC: 0 % (ref 0.0–0.2)

## 2022-10-09 LAB — BASIC METABOLIC PANEL
Anion gap: 9 (ref 5–15)
BUN: 13 mg/dL (ref 6–20)
CO2: 25 mmol/L (ref 22–32)
Calcium: 8.1 mg/dL — ABNORMAL LOW (ref 8.9–10.3)
Chloride: 100 mmol/L (ref 98–111)
Creatinine, Ser: 0.86 mg/dL (ref 0.61–1.24)
GFR, Estimated: >60 mL/min (ref >60)
Glucose, Bld: 107 mg/dL — ABNORMAL HIGH (ref 70–99)
Potassium: 4.6 mmol/L (ref 3.5–5.1)
Sodium: 134 mmol/L — ABNORMAL LOW (ref 135–145)

## 2022-10-09 LAB — PHOSPHORUS: Phosphorus: 2.9 mg/dL (ref 2.5–4.6)

## 2022-10-09 NOTE — Progress Notes (Signed)
[facility] Progress Note  2 Days Post-Op  Subjective: Patient seen and examined.  He is resting comfortably in bed.  His pain is well-controlled currently.  He is tolerating his liquids without nausea and vomiting.  He denies any flatus or bowel movements.  He confirms belching.  He has been able to urinate since his Foley catheter was removed and he is ambulating in his room.  Objective: Vital signs in last 24 hours: Temp:  [97.8 F (36.6 C)-98.6 F (37 C)] 97.8 F (36.6 C) (05/26 0420) Pulse Rate:  [55-65] 65 (05/26 0835) Resp:  [18-19] 19 (05/26 0420) BP: (102-114)/(67-68) 114/68 (05/26 0420) SpO2:  [92 %-99 %] 92 % (05/26 0420) Last BM Date : 10/08/22  Intake/Output from previous day: 05/25 0701 - 05/26 0700 In: 1211.3 [P.O.:720; I.V.:491.3] Out: 1750 [Urine:1750] Intake/Output this shift: Total I/O In: 240 [P.O.:240] Out: -   General appearance: alert, cooperative, and no distress GI: Abdomen soft, nondistended, no percussion tenderness, minimal incisional tenderness to palpation; no rigidity, guarding, rebound tenderness; midline incision C/D/I with skin staples in place and honeycomb dressing, mild dried bloody strikethrough; laparoscopic sites with gauze in place, no strikethrough  Lab Results:  Recent Labs    10/08/22 0504 10/09/22 0501  WBC 11.7* 12.0*  HGB 11.1* 10.3*  HCT 34.1* 32.6*  PLT 249 230   BMET Recent Labs    10/08/22 0504 10/09/22 0501  NA 133* 134*  K 4.6 4.6  CL 101 100  CO2 24 25  GLUCOSE 140* 107*  BUN 11 13  CREATININE 0.91 0.86  CALCIUM 8.1* 8.1*   PT/INR No results for input(s): "LABPROT", "INR" in the last 72 hours.  Studies/Results: No results found.  Anti-infectives: Anti-infectives (From admission, onward)    Start     Dose/Rate Route Frequency Ordered Stop   10/07/22 0930  cefoTEtan (CEFOTAN) 2 g in sodium chloride 0.9 % 100 mL IVPB        2 g 200 mL/hr over 30 Minutes Intravenous On call to O.R.  10/07/22 4098 10/08/22 0734   10/07/22 0930  sodium chloride 0.9 % with cefoTEtan (CEFOTAN) ADS Med       Note to Pharmacy: Laureen Ochs R: cabinet override      10/07/22 0930 10/07/22 1330       Assessment/Plan:  ERAS Protocol Daily Progress Note   POD#2 s/p robotic assisted laparoscopic right hemicolectomy Diet: Advance to GI soft diet IVF: Hep-Lock fluids Foley: Foley removed this morning BM/Flatus: No flatus or bowel movements Entereg: Yes, will continue until bowel movement Ambulation within 24 hours of surgery: Patient ambulating in room, encouraged him to continue ambulating Multimodal pain management: Scheduled Tylenol and Robaxin, as needed Toradol, oxycodone, and Dilaudid Encourage use of incentive spirometry Lovenox, SCDs   LOS: 2 days    Vincent Griffin Vincent Griffin 10/09/2022

## 2022-10-09 NOTE — Progress Notes (Signed)
Patient ambulatory around room , alert and oriented x 4. Pain more manageable after prn oxycodone, gave patient incentive spirometer and instructions for use. Call bell within reach.

## 2022-10-10 MED ORDER — OXYCODONE HCL 5 MG PO TABS: 5.0000 mg | ORAL_TABLET | ORAL | 0 refills | Status: DC | PRN

## 2022-10-10 NOTE — TOC CM/SW Note (Signed)
Transition of Care [facility]) - Inpatient Brief Assessment   Patient Details  Name: Vincent Griffin MRN: [id number] Date of Birth: 12-11-1960  Transition of Care [facility]) CM/SW Contact:    Elliot Gault, LCSW Phone Number: 10/10/2022, 11:06 AM   Clinical Narrative: Transition of Care Department [facility]) has reviewed patient and no TOC needs have been identified at this time. We will continue to monitor patient advancement through interdisciplinary progression rounds. If new patient transition needs arise, please place a TOC consult.   Transition of Care Asessment: Insurance and Status: Insurance coverage has been reviewed Patient has primary care physician: Yes Home environment has been reviewed: from home with spouse Prior level of function:: independent Prior/Current Home Services: No current home services Social Determinants of Health Reivew: SDOH reviewed no interventions necessary Readmission risk has been reviewed: Yes Transition of care needs: no transition of care needs at this time

## 2022-10-10 NOTE — Discharge Summary (Signed)
Physician Discharge Summary  Patient ID: Vincent Griffin MRN: [id number] DOB/AGE: 1960/07/15 62 y.o.  Admit date: 10/07/2022 Discharge date: 10/10/2022  Admission Diagnoses: Cecal carcinoma  Discharge Diagnoses:  Principal Problem:   S/P laparoscopic colectomy Active Problems:   Malignant neoplasm of ascending colon (HCC) Hypertension  Discharged Condition: good  Hospital Course: Patient is a 62 year old white male who presented to [facility] on 10/07/2022 for a robotic assisted laparoscopic right hemicolectomy for cecal carcinoma.  He tolerated the surgery well.  His postoperative course was for the most part unremarkable.  His diet was advanced without difficulty.  His bowel function has returned.  Final pathology is pending.  He is being discharged home on 10/10/2022 in good and improving condition.  Treatments: surgery: Robotic assisted laparoscopic right hemicolectomy on 10/07/2022  Discharge Exam: Blood pressure 126/74, pulse (!) 54, temperature (!) 97.5 F (36.4 C), temperature source Oral, resp. rate 17, height 5\' 10"  (1.778 m), weight (!) 139.4 kg, SpO2 94 %. General appearance: alert, cooperative, and no distress Resp: clear to auscultation bilaterally Cardio: regular rate and rhythm, S1, S2 normal, no murmur, click, rub or gallop GI: Soft, incisions healing well.  Disposition: Discharge disposition: 01-Home or Self Care       Discharge Instructions     Diet - low sodium heart healthy   Complete by: As directed    Increase activity slowly   Complete by: As directed       Allergies as of 10/10/2022       Reactions   Zetia [ezetimibe] Diarrhea, Nausea Only   Upset stomach        Medication List     STOP taking these medications    metroNIDAZOLE 500 MG tablet Commonly known as: Flagyl   neomycin 500 MG tablet Commonly known as: MYCIFRADIN       TAKE these medications    aspirin 81 MG chewable tablet Chew 81 mg by mouth daily.    atorvastatin 80 MG tablet Commonly known as: LIPITOR Take 80 mg by mouth at bedtime.   metoprolol succinate 25 MG 24 hr tablet Commonly known as: TOPROL-XL Take 25 mg by mouth daily.   nitroGLYCERIN 0.4 MG SL tablet Commonly known as: NITROSTAT Place 0.4 mg under the tongue every 5 (five) minutes as needed for chest pain.   oxyCODONE 5 MG immediate release tablet Commonly known as: Roxicodone Take 1 tablet (5 mg total) by mouth every 4 (four) hours as needed.        Follow-up Information     Franky Macho, MD. Schedule an appointment as soon as possible for a visit on 10/25/2022.   Specialty: General Surgery Contact information: [address] [place] [address] [phone]                 Signed: Franky Macho 10/10/2022, 9:09 AM

## 2022-10-10 NOTE — Plan of Care (Signed)

## 2022-10-10 NOTE — Progress Notes (Signed)
Only complaint this shift was minor headache, after tylenol given headache resolved. Pt was able to ambulate independently to restroom when needed. Pt currently resting.

## 2022-10-11 NOTE — Progress Notes (Signed)
Post op phone call completed. Pt had questions regarding diet after surgery. EMailed pt instructions and example of low fiber diet.

## 2022-10-12 ENCOUNTER — Telehealth: Payer: Self-pay

## 2022-10-12 LAB — SURGICAL PATHOLOGY

## 2022-10-12 NOTE — Transitions of Care (Post Inpatient/ED Visit) (Signed)
   10/12/2022  Name: Vincent Griffin MRN: [id number] DOB: 1960-10-08  Today's TOC FU Call Status: Today's TOC FU Call Status:: Unsuccessul Call (1st Attempt) Unsuccessful Call (1st Attempt) Date: 10/12/22  Attempted to reach the patient regarding the most recent Inpatient/ED visit.  Follow Up Plan: Additional outreach attempts will be made to reach the patient to complete the Transitions of Care (Post Inpatient/ED visit) call.   Jodelle Gross, RN, BSN, CCM Care Management Coordinator [facility]

## 2022-10-12 NOTE — Telephone Encounter (Signed)
.  RC

## 2022-10-13 ENCOUNTER — Other Ambulatory Visit: Payer: Self-pay | Admitting: *Deleted

## 2022-10-13 ENCOUNTER — Ambulatory Visit (INDEPENDENT_AMBULATORY_CARE_PROVIDER_SITE_OTHER): Payer: BC Managed Care – PPO | Admitting: General Surgery

## 2022-10-13 ENCOUNTER — Telehealth: Payer: Self-pay | Admitting: *Deleted

## 2022-10-13 ENCOUNTER — Other Ambulatory Visit: Payer: Self-pay

## 2022-10-13 ENCOUNTER — Ambulatory Visit (HOSPITAL_COMMUNITY)
Admission: RE | Admit: 2022-10-13 | Discharge: 2022-10-13 | Disposition: A | Discharge status: Home / Self Care | Payer: BC Managed Care – PPO | Source: Ambulatory Visit | Attending: General Surgery | Admitting: General Surgery

## 2022-10-13 ENCOUNTER — Encounter: Payer: Self-pay | Admitting: General Surgery

## 2022-10-13 VITALS — BP 110/70 | HR 56 | Temp 97.6°F | Resp 16 | Ht 70.0 in | Wt 290.0 lb

## 2022-10-13 DIAGNOSIS — R600 Localized edema: Secondary | ICD-10-CM

## 2022-10-13 DIAGNOSIS — R609 Edema, unspecified: Secondary | ICD-10-CM | POA: Diagnosis not present

## 2022-10-13 DIAGNOSIS — Z09 Encounter for follow-up examination after completed treatment for conditions other than malignant neoplasm: Secondary | ICD-10-CM

## 2022-10-13 DIAGNOSIS — M7989 Other specified soft tissue disorders: Secondary | ICD-10-CM | POA: Diagnosis not present

## 2022-10-13 DIAGNOSIS — R238 Other skin changes: Secondary | ICD-10-CM | POA: Diagnosis not present

## 2022-10-13 NOTE — Progress Notes (Signed)
Subjective:     Vincent Griffin  Patient here for postoperative visit, status post robotic assisted laparoscopic right hemicolectomy.  He developed bilateral lower extremity swelling over the past few days.  A stat ultrasound of the lower extremities were negative for DVT.  His appetite is good.  He denies any significant shortness of breath.  His bowel movements have been within normal limits.  He denies any fever or chills. Objective:    BP 110/70   Pulse (!) 56   Temp 97.6 F (36.4 C) (Oral)   Resp 16   Ht 5\' 10"  (1.778 m)   Wt 290 lb (131.5 kg)   SpO2 97%   BMI 41.61 kg/m   General:  alert, cooperative, and no distress  Abdomen is soft, incisions healing well.  No erythema or drainage noted. 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  Margins were negative for malignancy.  18 lymph nodes negative for malignancy.  Patient fully aware.     Assessment:    Doing well postoperatively. No evidence of DVT of the lower extremities    Plan:   Patient was instructed to elevate his lower extremities when he is not ambulating.  He may slowly increase his activity as able.  He should avoid any heavy lifting over 20 pounds.  Continue wearing the abdominal binder.  Follow-up here on 10/25/2022.

## 2022-10-13 NOTE — Telephone Encounter (Signed)
Surgical Date: 10/07/2022 Procedure: XI ROBOT ASSISTED LAPAROSCOPIC PARTIAL COLECTOMY   Received call from patient [phone]~ telephone.   Patient reports x2 days of swelling to BLE. States that legs from knee down feel tight and are visibly swollen. States that there is not pitting once pressed by single digit. Reports no discoloration to skin. Reports that swelling does not fully resolve with elevation of BLE. Reports that ankle is really tight and there is a lot of pressure when trying to bend foot at ankle.   Denies SOB, chest pressure, fever/ chills.   Discussed with Dr. Lovell Sheehan. New orders obtained for BLE [place] to R/O postoperative DVT. Orders placed and scheduled for 9am on 10/13/2022. Call placed to patient and patient made aware. Appointment scheduled with Dr. Lovell Sheehan after STAT US for evaluation per patient request.   CPT [id number] Dx: R60.0/R60.9 [employer] [place]~ PPA List~ No Authorization Required  [employer], LPN/ RSA

## 2022-10-14 ENCOUNTER — Telehealth: Payer: Self-pay

## 2022-10-14 NOTE — Transitions of Care (Post Inpatient/ED Visit) (Signed)
   10/14/2022  Name: Vincent Griffin MRN: [id number] DOB: 1960-06-23  Today's TOC FU Call Status: Today's TOC FU Call Status:: Successful TOC FU Call Competed TOC FU Call Complete Date: 10/14/22  Transition Care Management Follow-up Telephone Call Date of Discharge: 10/10/22 [facility] Type of Discharge: Inpatient Admission Primary Inpatient Discharge Diagnosis:: Malignant Neoplasm of Ascending Colon How have you been since you were released from the hospital?: Better  Items Reviewed: Did you receive and understand the discharge instructions provided?: Yes (Patients legs swelled and he was seen yesterday, ultrasound done and he was negative for DVT) Medications obtained,verified, and reconciled?: Yes (Medications Reviewed) Any new allergies since your discharge?: No Dietary orders reviewed?: Yes Type of Diet Ordered:: Low Sodium, Heart Healthy Do you have support at home?: Yes People in Home: spouse Name of Support/Comfort Primary Source: Revonda Standard  Medications Reviewed Today: Medications Reviewed Today     Reviewed by Jodelle Gross, RN (Case Manager) on 10/14/22 at 1145  Med List Status: <None>   Medication Order Taking? Sig Documenting Provider Last Dose Status Informant  aspirin 81 MG chewable tablet [id number] Yes Chew 81 mg by mouth daily. [provider] Taking Active Self  atorvastatin (LIPITOR) 80 MG tablet [id number] Yes Take 80 mg by mouth at bedtime. [provider] Taking Active Self  metoprolol succinate (TOPROL-XL) 25 MG 24 hr tablet [id number] Yes Take 25 mg by mouth daily. [provider] Taking Active Self  nitroGLYCERIN (NITROSTAT) 0.4 MG SL tablet [id number] No Place 0.4 mg under the tongue every 5 (five) minutes as needed for chest pain. [provider] Unknown Active Self  oxyCODONE (ROXICODONE) 5 MG immediate release tablet [id number] Yes Take 1 tablet (5 mg total) by mouth every 4 (four) hours as needed.  Franky Macho, MD Taking Active             Home Care and Equipment/Supplies: Were Home Health Services Ordered?: No Any new equipment or medical supplies ordered?: No  Functional Questionnaire: Do you need assistance with bathing/showering or dressing?: No Do you need assistance with meal preparation?: No Do you need assistance with eating?: No Do you have difficulty maintaining continence: No Do you need assistance with getting out of bed/getting out of a chair/moving?: No Do you have difficulty managing or taking your medications?: No  Follow up appointments reviewed: PCP Follow-up appointment confirmed?: NA Specialist Hospital Follow-up appointment confirmed?: Yes Date of Specialist follow-up appointment?: 10/25/22 Follow-Up Specialty Provider:: Dr. Lodema Hong (surgeon) Do you need transportation to your follow-up appointment?: No Do you understand care options if your condition(s) worsen?: Yes-patient verbalized understanding  SDOH Interventions Today    Flowsheet Row Most Recent Value  SDOH Interventions   Food Insecurity Interventions Intervention Not Indicated  Housing Interventions Intervention Not Indicated  Transportation Interventions Intervention Not Indicated     Jodelle Gross, RN, BSN, CCM Care Management Coordinator [facility] Phone: [phone]/Fax: [phone]

## 2022-10-19 ENCOUNTER — Telehealth: Payer: Self-pay | Admitting: Family Medicine

## 2022-10-19 NOTE — Telephone Encounter (Signed)
FMLA paperwork completed and faxed to [employer] at [phone]. Confirmation received.  Out of work for Laparoscopic Partial Colectomy starting 10/07/22 and may return to work unrestricted on 11/21/2022.

## 2022-10-25 ENCOUNTER — Encounter: Payer: Self-pay | Admitting: General Surgery

## 2022-10-25 ENCOUNTER — Ambulatory Visit (INDEPENDENT_AMBULATORY_CARE_PROVIDER_SITE_OTHER): Payer: BC Managed Care – PPO | Admitting: General Surgery

## 2022-10-25 VITALS — BP 97/66 | HR 67 | Temp 97.8°F | Resp 16 | Ht 70.0 in | Wt 285.0 lb

## 2022-10-25 DIAGNOSIS — Z09 Encounter for follow-up examination after completed treatment for conditions other than malignant neoplasm: Secondary | ICD-10-CM

## 2022-10-25 NOTE — Progress Notes (Signed)
Subjective:     Vincent Griffin  Patient here for postoperative visit, status post robotic assisted laparoscopic right hemicolectomy.  He is doing well.  He is increasing his activity as able.  His bowel movements have returned to normal.  He denies any fever or chills. Objective:    BP 97/66   Pulse 67   Temp 97.8 F (36.6 C) (Oral)   Resp 16   Ht 5\' 10"  (1.778 m)   Wt 285 lb (129.3 kg)   SpO2 92%   BMI 40.89 kg/m   General:  alert, cooperative, and no distress  Abdomen soft, incisions healing well.  Staples removed, Steri-Strips applied.  Final pathology reveals a T3, N0, M0 adenocarcinoma of the colon.     Assessment:    Doing well postoperatively.    Plan:   Patient should avoid lifting anything over 25 pounds for the next few weeks.  He may increase his activity as able.  He already has a follow-up appointment with Dr. Ellin Saba of oncology.  May return to work without restrictions on November 21, 2022.  Follow-up here as needed.

## 2022-11-02 DIAGNOSIS — C180 Malignant neoplasm of cecum: Secondary | ICD-10-CM | POA: Insufficient documentation

## 2022-11-03 ENCOUNTER — Inpatient Hospital Stay: Payer: BC Managed Care – PPO | Attending: Hematology | Admitting: Hematology

## 2022-11-03 ENCOUNTER — Inpatient Hospital Stay: Payer: BC Managed Care – PPO

## 2022-11-03 VITALS — BP 104/71 | HR 74 | Temp 98.3°F | Resp 19 | Ht 69.0 in | Wt 290.6 lb

## 2022-11-03 DIAGNOSIS — F17210 Nicotine dependence, cigarettes, uncomplicated: Secondary | ICD-10-CM | POA: Insufficient documentation

## 2022-11-03 DIAGNOSIS — D649 Anemia, unspecified: Secondary | ICD-10-CM | POA: Diagnosis not present

## 2022-11-03 DIAGNOSIS — C180 Malignant neoplasm of cecum: Secondary | ICD-10-CM | POA: Diagnosis not present

## 2022-11-03 DIAGNOSIS — R918 Other nonspecific abnormal finding of lung field: Secondary | ICD-10-CM | POA: Diagnosis not present

## 2022-11-03 LAB — CBC WITH DIFFERENTIAL/PLATELET
Abs Immature Granulocytes: 0.04 10*3/uL (ref 0.00–0.07)
Basophils Absolute: 0.1 10*3/uL (ref 0.0–0.1)
Basophils Relative: 1 %
Eosinophils Absolute: 0.4 10*3/uL (ref 0.0–0.5)
Eosinophils Relative: 5 %
HCT: 41.4 % (ref 39.0–52.0)
Hemoglobin: 13.3 g/dL (ref 13.0–17.0)
Immature Granulocytes: 1 %
Lymphocytes Relative: 27 %
Lymphs Abs: 2.3 10*3/uL (ref 0.7–4.0)
MCH: 31.7 pg (ref 26.0–34.0)
MCHC: 32.1 g/dL (ref 30.0–36.0)
MCV: 98.6 fL (ref 80.0–100.0)
Monocytes Absolute: 0.8 10*3/uL (ref 0.1–1.0)
Monocytes Relative: 9 %
Neutro Abs: 5 10*3/uL (ref 1.7–7.7)
Neutrophils Relative %: 57 %
Platelets: 291 10*3/uL (ref 150–400)
RBC: 4.2 MIL/uL — ABNORMAL LOW (ref 4.22–5.81)
RDW: 14.6 % (ref 11.5–15.5)
WBC: 8.5 10*3/uL (ref 4.0–10.5)
nRBC: 0 % (ref 0.0–0.2)

## 2022-11-03 LAB — COMPREHENSIVE METABOLIC PANEL
ALT: 30 U/L (ref 0–44)
AST: 28 U/L (ref 15–41)
Albumin: 3.4 g/dL — ABNORMAL LOW (ref 3.5–5.0)
Alkaline Phosphatase: 101 U/L (ref 38–126)
Anion gap: 8 (ref 5–15)
BUN: 10 mg/dL (ref 6–20)
CO2: 26 mmol/L (ref 22–32)
Calcium: 8.9 mg/dL (ref 8.9–10.3)
Chloride: 102 mmol/L (ref 98–111)
Creatinine, Ser: 1.06 mg/dL (ref 0.61–1.24)
GFR, Estimated: >60 mL/min (ref >60)
Glucose, Bld: 92 mg/dL (ref 70–99)
Potassium: 4.6 mmol/L (ref 3.5–5.1)
Sodium: 136 mmol/L (ref 135–145)
Total Bilirubin: 0.5 mg/dL (ref 0.3–1.2)
Total Protein: 7.1 g/dL (ref 6.5–8.1)

## 2022-11-03 LAB — IRON AND TIBC
Iron: 114 ug/dL (ref 45–182)
Saturation Ratios: 32 % (ref 17.9–39.5)
TIBC: 352 ug/dL (ref 250–450)
UIBC: 238 ug/dL

## 2022-11-03 LAB — FERRITIN: Ferritin: 50 ng/mL (ref 24–336)

## 2022-11-03 NOTE — Progress Notes (Signed)
[facility] [address] [place] [address]   Clinic Day:  11/03/2022  Referring physician: Junie Spencer, FNP  Patient Care Team: Junie Spencer, FNP as PCP - General (Family Medicine) Jena Gauss Gerrit Friends, MD as Consulting Physician (Gastroenterology)   ASSESSMENT & PLAN:   Assessment:  1.  Stage II (T3 N0) cecal adenocarcinoma: - Colonoscopy (09/08/2022): Cecal mass - CT CAP (09/13/2022): Large cecal mass, small pericolonic and ileocolic nodes.  Multiple bilateral pulmonary nodules measuring up to 7 mm. - CEA (10/05/2022): 42.1 - Robotic assisted laparoscopic right hemicolectomy on 10/07/2022 by Dr. Lovell Sheehan. - Pathology: Invasive moderately differentiated adenocarcinoma, margins negative, 0/18 lymph nodes involved, pT3 pN0, MMR preserved.  2.  Social/family history: - Lives at home with his wife and is independent of ADLs and IADLs.  He is now working at a [employer].  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  Current active smoker, 1 pack/day, started at age 41. - 2-3 maternal aunts had cancer.  He does not know the type.  Plan:  1.  Stage II (T3 N0 G2) cecal adenocarcinoma: - We have reviewed CT scan findings and pathology report with the patient in detail. - He has disproportionately high CEA level for his stage of the disease.  Hence I have recommended PET CT scan and CEA level. - RTC after the scan.  2.  Normocytic anemia: - Last hemoglobin was 10.3.  Will check CBC, ferritin and iron panel.   Orders Placed This Encounter  Procedures   NM PET Image Initial (PI) Skull Base To Thigh    Standing Status:   Future    Standing Expiration Date:   11/03/2023    Order Specific Question:   If indicated for the ordered procedure, I authorize the administration of a radiopharmaceutical per Radiology protocol    Answer:   Yes    Order Specific Question:   Preferred imaging location?    Answer:   [facility]    Order Specific  Question:   Release to patient    Answer:   Immediate   CBC with Differential    Standing Status:   Future    Number of Occurrences:   1    Standing Expiration Date:   11/03/2023   Comprehensive metabolic panel    Standing Status:   Future    Number of Occurrences:   1    Standing Expiration Date:   11/03/2023   CEA    Standing Status:   Future    Number of Occurrences:   1    Standing Expiration Date:   11/03/2023   Iron and TIBC ([facility] ONLY)    Standing Status:   Future    Number of Occurrences:   1    Standing Expiration Date:   11/03/2023   Ferritin    Standing Status:   Future    Number of Occurrences:   1    Standing Expiration Date:   11/03/2023      I,Katie Daubenspeck,acting as a scribe for Doreatha Massed, MD.,have documented all relevant documentation on the behalf of Doreatha Massed, MD,as directed by  Doreatha Massed, MD while in the presence of Doreatha Massed, MD.   I, Doreatha Massed MD, have reviewed the above documentation for accuracy and completeness, and I agree with the above.   Doreatha Massed, MD   6/20/20246:13 PM  CHIEF COMPLAINT/PURPOSE OF CONSULT:   Diagnosis: cecal cancer   Cancer Staging  Cecal cancer [facility]) Staging form: Colon and Rectum, AJCC 8th Edition - Clinical stage from 11/02/2022: Stage IIA (cT3, cN0, cM0) - Unsigned    Prior Therapy: right hemicolectomy, 10/07/22  Current Therapy: Workup   HISTORY OF PRESENT ILLNESS:   Oncology History   No history exists.      Vincent Griffin is a 62 y.o. male presenting to clinic today for evaluation of cecal cancer at the request of Dr. Lovell Sheehan.  He initially presented to his PCP in 04/2021 with several episodes of rectal bleeding. He was referred to GI in 05/2021 and colonoscopy was recommended. He never proceeded with colonoscopy at that time. More recently, he presented back to his PCP in 06/2022 with new diarrhea and vomiting. Given this and his need for  screening colonoscopy, he was referred back to GI.  He finally proceeded with colonoscopy on 09/08/22 under Dr. Marletta Lor showing: malignant partially-obstructing tumor in cecum. Biopsy of the cecal mass revealed invasive moderately differentiated adenocarcinoma.  He proceeded to staging CT C/A/P on 09/13/22 showing: large cecal mass, appears to be localized to region of ileocecal valve, involving this, cecal tip, and terminal ileum; small pericolonic and ileocolic nodes in region of cecal tip; multiple bilateral pulmonary nodules measuring up to 7 mm.  He was referred to Dr. Lovell Sheehan and was taken for right hemicolectomy on 10/07/22. Pathology showed: 6.6 cm invasive moderately differentiated adenocarcinoma, involving cecum with a contiguous fistulous opening into terminal ileum and invading into pericolonic soft tissue; resection margins and all 18 lymph nodes were negative; MMR normal.  Today, he states that he is doing well overall. His appetite level is at 100%. His energy level is at 90%.  PAST MEDICAL HISTORY:   Past Medical History: Past Medical History:  Diagnosis Date   CAD (coronary artery disease)    History of kidney stones    Hypercholesterolemia    Hypertension    Myocardial infarction [facility]) 2022    Surgical History: Past Surgical History:  Procedure Laterality Date   BIOPSY  09/08/2022   Procedure: BIOPSY;  Surgeon: [name], DO;  Location: [facility];  Service: Endoscopy;;   CARDIAC CATHETERIZATION  07/2020   with stent.   CATARACT EXTRACTION     CATARACT EXTRACTION W/PHACO Left 01/07/2016   Procedure: CATARACT EXTRACTION PHACO AND INTRAOCULAR LENS PLACEMENT LEFT EYE CDE=13.32;  Surgeon: [name], MD;  Location: [facility];  Service: Ophthalmology;  Laterality: Left;   COLONOSCOPY WITH PROPOFOL N/A 09/08/2022   Procedure: COLONOSCOPY WITH PROPOFOL;  Surgeon: [name], DO;  Location: [facility];  Service: Endoscopy;  Laterality: N/A;  11:30 am    POLYPECTOMY  09/08/2022   Procedure: POLYPECTOMY;  Surgeon: [name], DO;  Location: [facility];  Service: Endoscopy;;   SMALL INTESTINE SURGERY  1972   SUBMUCOSAL TATTOO INJECTION  09/08/2022   Procedure: SUBMUCOSAL TATTOO INJECTION;  Surgeon: [name], DO;  Location: [facility];  Service: Endoscopy;;    Social History: Social History   Socioeconomic History   Marital status: Married    Spouse name: Not on file   Number of children: Not on file   Years of education: Not on file   Highest education level: Not on file  Occupational History   Not on file  Tobacco Use   Smoking status: Heavy Smoker    Packs/day: 1    Types: Cigarettes   Smokeless tobacco: Never  Substance and Sexual Activity   Alcohol use: No   Drug  use: No   Sexual activity: Not on file  Other Topics Concern   Not on file  Social History Narrative   Not on file   Social Determinants of Health   Financial Resource Strain: Not on file  Food Insecurity: No Food Insecurity (10/14/2022)   Hunger Vital Sign    Worried About Running Out of Food in the Last Year: Never true    Ran Out of Food in the Last Year: Never true  Transportation Needs: No Transportation Needs (10/14/2022)   PRAPARE - Administrator, Civil Service (Medical): No    Lack of Transportation (Non-Medical): No  Physical Activity: Not on file  Stress: Not on file  Social Connections: Not on file  Intimate Partner Violence: Not At Risk (10/07/2022)   Humiliation, Afraid, Rape, and Kick questionnaire    Fear of Current or Ex-Partner: No    Emotionally Abused: No    Physically Abused: No    Sexually Abused: No    Family History: Family History  Problem Relation Age of Onset   Diabetes Mother    Stroke Mother    Colon polyps Brother    Colon cancer Neg Hx     Current Medications:  Current Outpatient Medications:    aspirin 81 MG chewable tablet, Chew 81 mg by mouth daily., Disp: , Rfl:    atorvastatin  (LIPITOR) 80 MG tablet, Take 80 mg by mouth at bedtime., Disp: , Rfl:    metoprolol succinate (TOPROL-XL) 25 MG 24 hr tablet, Take 25 mg by mouth daily., Disp: , Rfl:    nitroGLYCERIN (NITROSTAT) 0.4 MG SL tablet, Place 0.4 mg under the tongue every 5 (five) minutes as needed for chest pain., Disp: , Rfl:    oxyCODONE (ROXICODONE) 5 MG immediate release tablet, Take 1 tablet (5 mg total) by mouth every 4 (four) hours as needed., Disp: 20 tablet, Rfl: 0   Allergies: Allergies  Allergen Reactions   Zetia [Ezetimibe] Diarrhea and Nausea Only    Upset stomach    REVIEW OF SYSTEMS:   Review of Systems  Constitutional:  Negative for chills, fatigue and fever.  HENT:   Negative for lump/mass, mouth sores, nosebleeds, sore throat and trouble swallowing.   Eyes:  Negative for eye problems.  Respiratory:  Negative for cough and shortness of breath.   Cardiovascular:  Negative for chest pain, leg swelling and palpitations.  Gastrointestinal:  Negative for abdominal pain, constipation, diarrhea, nausea and vomiting.  Genitourinary:  Negative for bladder incontinence, difficulty urinating, dysuria, frequency, hematuria and nocturia.   Musculoskeletal:  Negative for arthralgias, back pain, flank pain, myalgias and neck pain.  Skin:  Negative for itching and rash.  Neurological:  Negative for dizziness, headaches and numbness.  Hematological:  Does not bruise/bleed easily.  Psychiatric/Behavioral:  Negative for depression, sleep disturbance and suicidal ideas. The patient is not nervous/anxious.   All other systems reviewed and are negative.    VITALS:   Blood pressure 104/71, pulse 74, temperature 98.3 F (36.8 C), temperature source Oral, resp. rate 19, height 5\' 9"  (1.753 m), weight 290 lb 9.6 oz (131.8 kg), SpO2 95 %.  Wt Readings from Last 3 Encounters:  11/03/22 290 lb 9.6 oz (131.8 kg)  10/25/22 285 lb (129.3 kg)  10/13/22 290 lb (131.5 kg)    Body mass index is 42.91  kg/m.  Performance status (ECOG): 0 - Asymptomatic  PHYSICAL EXAM:   Physical Exam Vitals and nursing note reviewed. Exam conducted with a chaperone present.  Constitutional:      Appearance: Normal appearance.  Cardiovascular:     Rate and Rhythm: Normal rate and regular rhythm.     Pulses: Normal pulses.     Heart sounds: Normal heart sounds.  Pulmonary:     Effort: Pulmonary effort is normal.     Breath sounds: Normal breath sounds.  Abdominal:     Palpations: Abdomen is soft. There is no hepatomegaly, splenomegaly or mass.     Tenderness: There is no abdominal tenderness.  Musculoskeletal:     Right lower leg: No edema.     Left lower leg: No edema.  Lymphadenopathy:     Cervical: No cervical adenopathy.     Right cervical: No superficial, deep or posterior cervical adenopathy.    Left cervical: No superficial, deep or posterior cervical adenopathy.     Upper Body:     Right upper body: No supraclavicular or axillary adenopathy.     Left upper body: No supraclavicular or axillary adenopathy.  Neurological:     General: No focal deficit present.     Mental Status: He is alert and oriented to person, place, and time.  Psychiatric:        Mood and Affect: Mood normal.        Behavior: Behavior normal.     LABS:      Latest Ref Rng & Units 11/03/2022    1:41 PM 10/09/2022    5:01 AM 10/08/2022    5:04 AM  CBC  WBC 4.0 - 10.5 K/uL 8.5  12.0  11.7   Hemoglobin 13.0 - 17.0 g/dL 40.9  81.1  91.4   Hematocrit 39.0 - 52.0 % 41.4  32.6  34.1   Platelets 150 - 400 K/uL 291  230  249       Latest Ref Rng & Units 11/03/2022    1:41 PM 10/09/2022    5:01 AM 10/08/2022    5:04 AM  CMP  Glucose 70 - 99 mg/dL 92  782  956   BUN 6 - 20 mg/dL 10  13  11    Creatinine 0.61 - 1.24 mg/dL 2.13  0.86  5.78   Sodium 135 - 145 mmol/L 136  134  133   Potassium 3.5 - 5.1 mmol/L 4.6  4.6  4.6   Chloride 98 - 111 mmol/L 102  100  101   CO2 22 - 32 mmol/L 26  25  24    Calcium 8.9 -  10.3 mg/dL 8.9  8.1  8.1   Total Protein 6.5 - 8.1 g/dL 7.1     Total Bilirubin 0.3 - 1.2 mg/dL 0.5     Alkaline Phos 38 - 126 U/L 101     AST 15 - 41 U/L 28     ALT 0 - 44 U/L 30        Lab Results  Component Value Date   CEA1 42.1 (H) 10/05/2022   /  CEA  Date Value Ref Range Status  10/05/2022 42.1 (H) 0.0 - 4.7 ng/mL Final    Comment:    (NOTE)                             Nonsmokers          <3.9                             Smokers             <  5.6 Roche [employer]ostics Electrochemiluminescence Immunoassay (ECLIA) Values obtained with different assay methods or kits cannot be used interchangeably.  Results cannot be interpreted as absolute evidence of the presence or absence of malignant disease. Performed At: [facility] [address] [place], [place] [id number] Jolene Schimke MD WU:[id number]    Lab Results  Component Value Date   PSA1 0.5 06/05/2020   No results found for: "NFA213" No results found for: "CAN125"  No results found for: "TOTALPROTELP", "ALBUMINELP", "A1GS", "A2GS", "BETS", "BETA2SER", "GAMS", "MSPIKE", "SPEI" Lab Results  Component Value Date   TIBC 352 11/03/2022   FERRITIN 50 11/03/2022   IRONPCTSAT 32 11/03/2022   No results found for: "LDH"   STUDIES:   US Venous Img Lower Bilateral (DVT)  Result Date: 10/13/2022 CLINICAL DATA:  Bilateral lower extremity swelling. Recent colectomy EXAM: Bilateral LOWER EXTREMITY VENOUS DOPPLER ULTRASOUND TECHNIQUE: Gray-scale sonography with compression, as well as color and duplex ultrasound, were performed to evaluate the deep venous system(s) from the level of the common femoral vein through the popliteal and proximal calf veins. COMPARISON:  None Available. FINDINGS: VENOUS Normal compressibility of the common femoral, superficial femoral, and popliteal veins, as well as the visualized calf veins. Visualized portions of profunda femoral vein and great saphenous vein unremarkable. No filling  defects to suggest DVT on grayscale or color Doppler imaging. Doppler waveforms show normal direction of venous flow, normal respiratory plasticity and response to augmentation. Limited views of the contralateral common femoral vein are unremarkable. OTHER None. Limitations: none IMPRESSION: No evidence of bilateral lower extremity DVT Electronically Signed   By: Karen Kays M.D.   On: 10/13/2022 10:46   Chest 2 View  Result Date: 10/08/2022 CLINICAL DATA:  [id number] Colon cancer [facility]) [id number] EXAM: CHEST - 2 VIEW COMPARISON:  06/01/2021 chest x-ray and 09/13/2022 CT FINDINGS: The heart size and mediastinal contours are within normal limits. There is diffuse pulmonary interstitial prominence which could be seen with atypical infection, edema or interstitial lung disease. Tiny nodular densities identified predominantly on the left. See the recent CT scan. There is no focal consolidation. No pneumothorax or pleural effusion. The visualized osseous structures are unremarkable. IMPRESSION: 1. Nonspecific interstitial prominence consistent with atypical infection, edema or interstitial lung disease. 2. Possible scattered tiny nodules that were better evaluated on CT and which should be followed with CT. Electronically Signed   By: Layla Maw M.D.   On: 10/08/2022 20:44

## 2022-11-03 NOTE — Patient Instructions (Addendum)
[facility] at [facility] Discharge Instructions   You were seen and examined today by Dr. Ellin Saba. He is a Systems developer who is seeing you today based on your recent diagnosis of colon cancer.   He discussed with you the results of your biopsy. It shows that you had adenocarcinoma of the colon. They biopsied 18 lymph nodes which were all negative for cancer. This makes this cancer a Stage II colon cancer.   Your CEA (colon cancer tumor marker) is elevated beyond what we would expect it to be now. Dr. Kirtland Bouchard would like to obtain a PET scan on you to ensure the cancer is not anywhere else in your body.   We will see you back after the PET scan to review those results.    Thank you for choosing [facility] at [facility] to provide your oncology and hematology care.  To afford each patient quality time with our provider, please arrive at least 15 minutes before your scheduled appointment time.   If you have a lab appointment with the Cancer Center please come in thru the Main Entrance and check in at the main information desk.  You need to re-schedule your appointment should you arrive 10 or more minutes late.  We strive to give you quality time with our providers, and arriving late affects you and other patients whose appointments are after yours.  Also, if you no show three or more times for appointments you may be dismissed from the clinic at the providers discretion.     Again, thank you for choosing [facility].  Our hope is that these requests will decrease the amount of time that you wait before being seen by our physicians.       _____________________________________________________________  Should you have questions after your visit to [facility], please contact our office at [phone] and follow the prompts.  Our office hours are 8:00 a.m. and 4:30 p.m. Monday - Friday.  Please note that voicemails left after  4:00 p.m. may not be returned until the following business day.  We are closed weekends and major holidays.  You do have access to a nurse 24-7, just call the main number to the clinic [phone] and do not press any options, hold on the line and a nurse will answer the phone.    For prescription refill requests, have your pharmacy contact our office and allow 72 hours.    Due to Covid, you will need to wear a mask upon entering the hospital. If you do not have a mask, a mask will be given to you at the Main Entrance upon arrival. For doctor visits, patients may have 1 support person age 23 or older with them. For treatment visits, patients can not have anyone with them due to social distancing guidelines and our immunocompromised population.

## 2022-11-06 LAB — CEA: CEA: 3.2 ng/mL (ref 0.0–4.7)

## 2022-11-10 ENCOUNTER — Ambulatory Visit (HOSPITAL_COMMUNITY)
Admission: RE | Admit: 2022-11-10 | Discharge: 2022-11-10 | Disposition: A | Discharge status: Home / Self Care | Payer: BC Managed Care – PPO | Source: Ambulatory Visit | Attending: Hematology | Admitting: Hematology

## 2022-11-10 DIAGNOSIS — C180 Malignant neoplasm of cecum: Secondary | ICD-10-CM | POA: Insufficient documentation

## 2022-11-10 NOTE — Telephone Encounter (Signed)
Patient PET scan re-scheduled to 11/24/2022. Patient requested to extend leave from work until after scan.   Dr. Lovell Sheehan made aware and agreeable to extending leave.   Letter transcribed and sent to [employer] at [phone]~ fax.

## 2022-11-15 ENCOUNTER — Inpatient Hospital Stay: Payer: BC Managed Care – PPO | Admitting: Hematology

## 2022-11-24 ENCOUNTER — Encounter (HOSPITAL_COMMUNITY)
Admission: RE | Admit: 2022-11-24 | Discharge: 2022-11-24 | Disposition: A | Discharge status: Home / Self Care | Payer: BC Managed Care – PPO | Source: Ambulatory Visit | Attending: Hematology | Admitting: Hematology

## 2022-11-24 ENCOUNTER — Encounter: Payer: Self-pay | Admitting: Family Medicine

## 2022-11-24 DIAGNOSIS — C180 Malignant neoplasm of cecum: Secondary | ICD-10-CM | POA: Diagnosis not present

## 2022-11-24 DIAGNOSIS — C189 Malignant neoplasm of colon, unspecified: Secondary | ICD-10-CM | POA: Diagnosis not present

## 2022-11-24 MED ORDER — FLUDEOXYGLUCOSE F - 18 (FDG) INJECTION
15.2400 | Freq: Once | INTRAVENOUS | Status: AC | PRN
  Administered 2022-11-24: 15.24 via INTRAVENOUS

## 2022-11-28 NOTE — Progress Notes (Signed)
[facility] [address], [place] [address]   Clinic Day:  11/29/2022  Referring physician: Junie Spencer, FNP  Patient Care Team: Junie Spencer, FNP as PCP - General (Family Medicine) Jena Gauss Gerrit Friends, MD as Consulting Physician (Gastroenterology)   ASSESSMENT & PLAN:   Assessment:  1.  Stage II (T3 N0) cecal adenocarcinoma: - Colonoscopy (09/08/2022): Cecal mass - CT CAP (09/13/2022): Large cecal mass, small pericolonic and ileocolic nodes.  Multiple bilateral pulmonary nodules measuring up to 7 mm. - CEA (10/05/2022): 42.1 - Robotic assisted laparoscopic right hemicolectomy on 10/07/2022 by Dr. Lovell Sheehan. - Pathology: Invasive moderately differentiated adenocarcinoma, margins negative, 0/18 lymph nodes involved, pT3 pN0, MMR preserved.  2.  Social/family history: - Lives at home with his wife and is independent of ADLs and IADLs.  He is now working at a [employer].  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  Current active smoker, 1 pack/day, started at age 9. - 2-3 maternal aunts had cancer.  He does not know the type.  Plan:  1.  Stage II (T3 N0 G2) cecal adenocarcinoma: - We have reviewed CT scan findings and pathology report with the patient in detail. - He has disproportionately high CEA level for his stage of the disease.  Hence I have recommended PET CT scan and CEA level. - RTC after the scan.  2.  Normocytic anemia: - Last hemoglobin was 10.3.  Will check CBC, ferritin and iron panel.   No orders of the defined types were placed in this encounter.      Alben Deeds Teague,acting as a Neurosurgeon for Doreatha Massed, MD.,have documented all relevant documentation on the behalf of Doreatha Massed, MD,as directed by  Doreatha Massed, MD while in the presence of Doreatha Massed, MD.  ***   [employer] R Teague   7/15/202412:25 PM  CHIEF COMPLAINT/PURPOSE OF CONSULT:   Diagnosis: cecal cancer   Cancer  Staging  Cecal cancer [facility]) Staging form: Colon and Rectum, AJCC 8th Edition - Clinical stage from 11/02/2022: Stage IIA (cT3, cN0, cM0) - Unsigned    Prior Therapy: right hemicolectomy, 10/07/22  Current Therapy: Workup   HISTORY OF PRESENT ILLNESS:   Oncology History   No history exists.    Vincent Griffin is a 62 y.o. male presenting to clinic today for evaluation of cecal cancer at the request of Dr. Lovell Sheehan.  He initially presented to his PCP in 04/2021 with several episodes of rectal bleeding. He was referred to GI in 05/2021 and colonoscopy was recommended. He never proceeded with colonoscopy at that time. More recently, he presented back to his PCP in 06/2022 with new diarrhea and vomiting. Given this and his need for screening colonoscopy, he was referred back to GI.  He finally proceeded with colonoscopy on 09/08/22 under Dr. Marletta Lor showing: malignant partially-obstructing tumor in cecum. Biopsy of the cecal mass revealed invasive moderately differentiated adenocarcinoma.  He proceeded to staging CT C/A/P on 09/13/22 showing: large cecal mass, appears to be localized to region of ileocecal valve, involving this, cecal tip, and terminal ileum; small pericolonic and ileocolic nodes in region of cecal tip; multiple bilateral pulmonary nodules measuring up to 7 mm.  He was referred to Dr. Lovell Sheehan and was taken for right hemicolectomy on 10/07/22. Pathology showed: 6.6 cm invasive moderately differentiated adenocarcinoma, involving cecum with a contiguous fistulous opening into terminal ileum and invading into pericolonic soft tissue; resection margins and all 18 lymph nodes were negative; MMR normal.  Today, he states that he is doing well overall. His appetite level is at 100%. His energy level is at 90%.  INTERVAL HISTORY:   Vincent Griffin is a 62 y.o. male presenting to clinic today for follow-up of cecal cancer. He was last seen by me on 11/03/22.  He underwent a PET scan on 7/11 that found    Today, he states that he is doing well overall. His appetite level is at ***%. His energy level is at ***%.   PAST MEDICAL HISTORY:   Past Medical History: Past Medical History:  Diagnosis Date   CAD (coronary artery disease)    History of kidney stones    Hypercholesterolemia    Hypertension    Myocardial infarction [facility]) 2022    Surgical History: Past Surgical History:  Procedure Laterality Date   BIOPSY  09/08/2022   Procedure: BIOPSY;  Surgeon: [name], DO;  Location: [facility];  Service: Endoscopy;;   CARDIAC CATHETERIZATION  07/2020   with stent.   CATARACT EXTRACTION     CATARACT EXTRACTION W/PHACO Left 01/07/2016   Procedure: CATARACT EXTRACTION PHACO AND INTRAOCULAR LENS PLACEMENT LEFT EYE CDE=13.32;  Surgeon: [name], MD;  Location: [facility] ORS;  Service: Ophthalmology;  Laterality: Left;   COLONOSCOPY WITH PROPOFOL N/A 09/08/2022   Procedure: COLONOSCOPY WITH PROPOFOL;  Surgeon: [name], DO;  Location: [facility];  Service: Endoscopy;  Laterality: N/A;  11:30 am   POLYPECTOMY  09/08/2022   Procedure: POLYPECTOMY;  Surgeon: [name], DO;  Location: [facility];  Service: Endoscopy;;   SMALL INTESTINE SURGERY  1972   SUBMUCOSAL TATTOO INJECTION  09/08/2022   Procedure: SUBMUCOSAL TATTOO INJECTION;  Surgeon: [name], DO;  Location: [facility];  Service: Endoscopy;;    Social History: Social History   Socioeconomic History   Marital status: Married    Spouse name: Not on file   Number of children: Not on file   Years of education: Not on file   Highest education level: Not on file  Occupational History   Not on file  Tobacco Use   Smoking status: Heavy Smoker    Current packs/day: 1.00    Types: Cigarettes   Smokeless tobacco: Never  Substance and Sexual Activity   Alcohol use: No   Drug use: No   Sexual activity: Not on file  Other Topics Concern   Not on file  Social History Narrative   Not on file    Social Determinants of Health   Financial Resource Strain: Low Risk  (08/12/2020)   Received from [facility]   Overall Financial Resource Strain (CARDIA)    Difficulty of Paying Living Expenses: Not hard at all  Food Insecurity: No Food Insecurity (10/14/2022)   Hunger Vital Sign    Worried About Running Out of Food in the Last Year: Never true    Ran Out of Food in the Last Year: Never true  Transportation Needs: No Transportation Needs (10/14/2022)   PRAPARE - Administrator, Civil Service (Medical): No    Lack of Transportation (Non-Medical): No  Physical Activity: Not on file  Stress: No Stress Concern Present (08/12/2020)   Received from [facility] - Occupational Stress Questionnaire    Feeling of Stress : Not at all  Social Connections: Unknown (09/20/2021)   Received from [facility]   Social Network    Social Network: Not on file  Intimate  Partner Violence: Not At Risk (10/07/2022)   Humiliation, Afraid, Rape, and Kick questionnaire    Fear of Current or Ex-Partner: No    Emotionally Abused: No    Physically Abused: No    Sexually Abused: No    Family History: Family History  Problem Relation Age of Onset   Diabetes Mother    Stroke Mother    Colon polyps Brother    Colon cancer Neg Hx     Current Medications:  Current Outpatient Medications:    aspirin 81 MG chewable tablet, Chew 81 mg by mouth daily., Disp: , Rfl:    atorvastatin (LIPITOR) 80 MG tablet, Take 80 mg by mouth at bedtime., Disp: , Rfl:    metoprolol succinate (TOPROL-XL) 25 MG 24 hr tablet, Take 25 mg by mouth daily., Disp: , Rfl:    nitroGLYCERIN (NITROSTAT) 0.4 MG SL tablet, Place 0.4 mg under the tongue every 5 (five) minutes as needed for chest pain., Disp: , Rfl:    oxyCODONE (ROXICODONE) 5 MG immediate release tablet, Take 1 tablet (5 mg total) by mouth every 4 (four) hours as needed., Disp: 20 tablet, Rfl: 0    Allergies: Allergies  Allergen Reactions   Zetia [Ezetimibe] Diarrhea and Nausea Only    Upset stomach    REVIEW OF SYSTEMS:   Review of Systems  Constitutional:  Negative for chills, fatigue and fever.  HENT:   Negative for lump/mass, mouth sores, nosebleeds, sore throat and trouble swallowing.   Eyes:  Negative for eye problems.  Respiratory:  Negative for cough and shortness of breath.   Cardiovascular:  Negative for chest pain, leg swelling and palpitations.  Gastrointestinal:  Negative for abdominal pain, constipation, diarrhea, nausea and vomiting.  Genitourinary:  Negative for bladder incontinence, difficulty urinating, dysuria, frequency, hematuria and nocturia.   Musculoskeletal:  Negative for arthralgias, back pain, flank pain, myalgias and neck pain.  Skin:  Negative for itching and rash.  Neurological:  Negative for dizziness, headaches and numbness.  Hematological:  Does not bruise/bleed easily.  Psychiatric/Behavioral:  Negative for depression, sleep disturbance and suicidal ideas. The patient is not nervous/anxious.   All other systems reviewed and are negative.    VITALS:   There were no vitals taken for this visit.  Wt Readings from Last 3 Encounters:  11/03/22 290 lb 9.6 oz (131.8 kg)  10/25/22 285 lb (129.3 kg)  10/13/22 290 lb (131.5 kg)    There is no height or weight on file to calculate BMI.  Performance status (ECOG): 0 - Asymptomatic  PHYSICAL EXAM:   Physical Exam Vitals and nursing note reviewed. Exam conducted with a chaperone present.  Constitutional:      Appearance: Normal appearance.  Cardiovascular:     Rate and Rhythm: Normal rate and regular rhythm.     Pulses: Normal pulses.     Heart sounds: Normal heart sounds.  Pulmonary:     Effort: Pulmonary effort is normal.     Breath sounds: Normal breath sounds.  Abdominal:     Palpations: Abdomen is soft. There is no hepatomegaly, splenomegaly or mass.     Tenderness: There is no  abdominal tenderness.  Musculoskeletal:     Right lower leg: No edema.     Left lower leg: No edema.  Lymphadenopathy:     Cervical: No cervical adenopathy.     Right cervical: No superficial, deep or posterior cervical adenopathy.    Left cervical: No superficial, deep or posterior cervical adenopathy.  Upper Body:     Right upper body: No supraclavicular or axillary adenopathy.     Left upper body: No supraclavicular or axillary adenopathy.  Neurological:     General: No focal deficit present.     Mental Status: He is alert and oriented to person, place, and time.  Psychiatric:        Mood and Affect: Mood normal.        Behavior: Behavior normal.     LABS:      Latest Ref Rng & Units 11/03/2022    1:41 PM 10/09/2022    5:01 AM 10/08/2022    5:04 AM  CBC  WBC 4.0 - 10.5 K/uL 8.5  12.0  11.7   Hemoglobin 13.0 - 17.0 g/dL 16.1  09.6  04.5   Hematocrit 39.0 - 52.0 % 41.4  32.6  34.1   Platelets 150 - 400 K/uL 291  230  249       Latest Ref Rng & Units 11/03/2022    1:41 PM 10/09/2022    5:01 AM 10/08/2022    5:04 AM  CMP  Glucose 70 - 99 mg/dL 92  409  811   BUN 6 - 20 mg/dL 10  13  11    Creatinine 0.61 - 1.24 mg/dL 9.14  7.82  9.56   Sodium 135 - 145 mmol/L 136  134  133   Potassium 3.5 - 5.1 mmol/L 4.6  4.6  4.6   Chloride 98 - 111 mmol/L 102  100  101   CO2 22 - 32 mmol/L 26  25  24    Calcium 8.9 - 10.3 mg/dL 8.9  8.1  8.1   Total Protein 6.5 - 8.1 g/dL 7.1     Total Bilirubin 0.3 - 1.2 mg/dL 0.5     Alkaline Phos 38 - 126 U/L 101     AST 15 - 41 U/L 28     ALT 0 - 44 U/L 30        Lab Results  Component Value Date   CEA1 3.2 11/03/2022   /  CEA  Date Value Ref Range Status  11/03/2022 3.2 0.0 - 4.7 ng/mL Final    Comment:    (NOTE)                             Nonsmokers          <3.9                             Smokers             <5.6 Roche [employer] Electrochemiluminescence Immunoassay (ECLIA) Values obtained with different assay methods or  kits cannot be used interchangeably.  Results cannot be interpreted as absolute evidence of the presence or absence of malignant disease. Performed At: [facility] [address] [place], [place] [id number] Jolene Schimke MD IO:[id number]    Lab Results  Component Value Date   PSA1 0.5 06/05/2020   No results found for: "KGM010" No results found for: "CAN125"  No results found for: "TOTALPROTELP", "ALBUMINELP", "A1GS", "A2GS", "BETS", "BETA2SER", "GAMS", "MSPIKE", "SPEI" Lab Results  Component Value Date   TIBC 352 11/03/2022   FERRITIN 50 11/03/2022   IRONPCTSAT 32 11/03/2022   No results found for: "LDH"   STUDIES:   No results found.

## 2022-11-29 ENCOUNTER — Encounter: Payer: Self-pay | Admitting: Hematology

## 2022-11-29 ENCOUNTER — Inpatient Hospital Stay: Payer: BC Managed Care – PPO

## 2022-11-29 ENCOUNTER — Inpatient Hospital Stay: Payer: BC Managed Care – PPO | Attending: Hematology | Admitting: Hematology

## 2022-11-29 VITALS — BP 126/83 | HR 78 | Temp 97.5°F | Resp 18 | Wt 297.1 lb

## 2022-11-29 DIAGNOSIS — F17210 Nicotine dependence, cigarettes, uncomplicated: Secondary | ICD-10-CM | POA: Insufficient documentation

## 2022-11-29 DIAGNOSIS — E278 Other specified disorders of adrenal gland: Secondary | ICD-10-CM | POA: Diagnosis not present

## 2022-11-29 DIAGNOSIS — C180 Malignant neoplasm of cecum: Secondary | ICD-10-CM

## 2022-11-29 DIAGNOSIS — D649 Anemia, unspecified: Secondary | ICD-10-CM | POA: Diagnosis not present

## 2022-11-29 NOTE — Patient Instructions (Signed)
[facility] at [facility] Discharge Instructions   You were seen and examined today by Dr. Ellin Saba.  He reviewed the results of your PET scan which was normal. There was no evidence of cancer on the exam.   There is no need for any treatment. We will just monitor you for now every 3 months.   Return as scheduled.    Thank you for choosing [facility] at [facility] to provide your oncology and hematology care.  To afford each patient quality time with our provider, please arrive at least 15 minutes before your scheduled appointment time.   If you have a lab appointment with the Cancer Center please come in thru the Main Entrance and check in at the main information desk.  You need to re-schedule your appointment should you arrive 10 or more minutes late.  We strive to give you quality time with our providers, and arriving late affects you and other patients whose appointments are after yours.  Also, if you no show three or more times for appointments you may be dismissed from the clinic at the providers discretion.     Again, thank you for choosing [facility].  Our hope is that these requests will decrease the amount of time that you wait before being seen by our physicians.       _____________________________________________________________  Should you have questions after your visit to [facility], please contact our office at [phone] and follow the prompts.  Our office hours are 8:00 a.m. and 4:30 p.m. Monday - Friday.  Please note that voicemails left after 4:00 p.m. may not be returned until the following business day.  We are closed weekends and major holidays.  You do have access to a nurse 24-7, just call the main number to the clinic [phone] and do not press any options, hold on the line and a nurse will answer the phone.    For prescription refill requests, have your pharmacy contact our office and  allow 72 hours.    Due to Covid, you will need to wear a mask upon entering the hospital. If you do not have a mask, a mask will be given to you at the Main Entrance upon arrival. For doctor visits, patients may have 1 support person age 60 or older with them. For treatment visits, patients can not have anyone with them due to social distancing guidelines and our immunocompromised population.

## 2022-12-13 DIAGNOSIS — M7989 Other specified soft tissue disorders: Secondary | ICD-10-CM | POA: Diagnosis not present

## 2022-12-13 DIAGNOSIS — I10 Essential (primary) hypertension: Secondary | ICD-10-CM | POA: Diagnosis not present

## 2022-12-13 DIAGNOSIS — I214 Non-ST elevation (NSTEMI) myocardial infarction: Secondary | ICD-10-CM | POA: Diagnosis not present

## 2022-12-13 DIAGNOSIS — I2510 Atherosclerotic heart disease of native coronary artery without angina pectoris: Secondary | ICD-10-CM | POA: Diagnosis not present

## 2023-01-09 ENCOUNTER — Encounter (HOSPITAL_COMMUNITY): Payer: Self-pay | Admitting: Hematology

## 2023-03-01 ENCOUNTER — Inpatient Hospital Stay: Payer: BC Managed Care – PPO | Attending: Hematology

## 2023-03-01 DIAGNOSIS — C180 Malignant neoplasm of cecum: Secondary | ICD-10-CM | POA: Insufficient documentation

## 2023-03-01 DIAGNOSIS — Z809 Family history of malignant neoplasm, unspecified: Secondary | ICD-10-CM | POA: Diagnosis not present

## 2023-03-01 DIAGNOSIS — R918 Other nonspecific abnormal finding of lung field: Secondary | ICD-10-CM | POA: Insufficient documentation

## 2023-03-01 DIAGNOSIS — K649 Unspecified hemorrhoids: Secondary | ICD-10-CM | POA: Diagnosis not present

## 2023-03-01 DIAGNOSIS — F17210 Nicotine dependence, cigarettes, uncomplicated: Secondary | ICD-10-CM | POA: Insufficient documentation

## 2023-03-01 LAB — CBC WITH DIFFERENTIAL/PLATELET
Abs Immature Granulocytes: 0.01 10*3/uL (ref 0.00–0.07)
Basophils Absolute: 0.1 10*3/uL (ref 0.0–0.1)
Basophils Relative: 1 %
Eosinophils Absolute: 0.3 10*3/uL (ref 0.0–0.5)
Eosinophils Relative: 5 %
HCT: 45.9 % (ref 39.0–52.0)
Hemoglobin: 14.9 g/dL (ref 13.0–17.0)
Immature Granulocytes: 0 %
Lymphocytes Relative: 23 %
Lymphs Abs: 1.5 10*3/uL (ref 0.7–4.0)
MCH: 32.3 pg (ref 26.0–34.0)
MCHC: 32.5 g/dL (ref 30.0–36.0)
MCV: 99.6 fL (ref 80.0–100.0)
Monocytes Absolute: 0.4 10*3/uL (ref 0.1–1.0)
Monocytes Relative: 7 %
Neutro Abs: 4 10*3/uL (ref 1.7–7.7)
Neutrophils Relative %: 64 %
Platelets: 209 10*3/uL (ref 150–400)
RBC: 4.61 MIL/uL (ref 4.22–5.81)
RDW: 14.6 % (ref 11.5–15.5)
WBC: 6.2 10*3/uL (ref 4.0–10.5)
nRBC: 0 % (ref 0.0–0.2)

## 2023-03-01 LAB — COMPREHENSIVE METABOLIC PANEL
ALT: 24 U/L (ref 0–44)
AST: 25 U/L (ref 15–41)
Albumin: 3.5 g/dL (ref 3.5–5.0)
Alkaline Phosphatase: 84 U/L (ref 38–126)
Anion gap: 6 (ref 5–15)
BUN: 16 mg/dL (ref 6–20)
CO2: 30 mmol/L (ref 22–32)
Calcium: 8.8 mg/dL — ABNORMAL LOW (ref 8.9–10.3)
Chloride: 101 mmol/L (ref 98–111)
Creatinine, Ser: 1.01 mg/dL (ref 0.61–1.24)
GFR, Estimated: >60 mL/min (ref >60)
Glucose, Bld: 93 mg/dL (ref 70–99)
Potassium: 4.4 mmol/L (ref 3.5–5.1)
Sodium: 137 mmol/L (ref 135–145)
Total Bilirubin: 0.8 mg/dL (ref 0.3–1.2)
Total Protein: 7.2 g/dL (ref 6.5–8.1)

## 2023-03-02 LAB — CEA: CEA: 1.7 ng/mL (ref 0.0–4.7)

## 2023-03-07 LAB — SIGNATERA

## 2023-03-08 ENCOUNTER — Encounter: Payer: Self-pay | Admitting: Hematology

## 2023-03-08 ENCOUNTER — Other Ambulatory Visit: Payer: Self-pay

## 2023-03-08 ENCOUNTER — Inpatient Hospital Stay: Payer: BC Managed Care – PPO | Admitting: Hematology

## 2023-03-08 VITALS — BP 130/80 | HR 64 | Temp 97.7°F | Resp 18 | Wt 298.1 lb

## 2023-03-08 DIAGNOSIS — C180 Malignant neoplasm of cecum: Secondary | ICD-10-CM

## 2023-03-08 DIAGNOSIS — Z809 Family history of malignant neoplasm, unspecified: Secondary | ICD-10-CM | POA: Diagnosis not present

## 2023-03-08 DIAGNOSIS — F17210 Nicotine dependence, cigarettes, uncomplicated: Secondary | ICD-10-CM | POA: Diagnosis not present

## 2023-03-08 DIAGNOSIS — R918 Other nonspecific abnormal finding of lung field: Secondary | ICD-10-CM | POA: Diagnosis not present

## 2023-03-08 DIAGNOSIS — K649 Unspecified hemorrhoids: Secondary | ICD-10-CM | POA: Diagnosis not present

## 2023-03-08 NOTE — Progress Notes (Signed)
[facility] [address], [place] [address]    Clinic Day:  03/08/2023  Referring physician: Junie Spencer, FNP  Patient Care Team: Junie Spencer, FNP as PCP - General (Family Medicine) Jena Gauss Gerrit Friends, MD as Consulting Physician (Gastroenterology)   ASSESSMENT & PLAN:   Assessment: 1.  Stage II (T3 N0) cecal adenocarcinoma: - Colonoscopy (09/08/2022): Cecal mass - CT CAP (09/13/2022): Large cecal mass, small pericolonic and ileocolic nodes.  Multiple bilateral pulmonary nodules measuring up to 7 mm. - CEA (10/05/2022): 42.1 - Robotic assisted laparoscopic right hemicolectomy on 10/07/2022 by Dr. Lovell Sheehan. - Pathology: Invasive moderately differentiated adenocarcinoma, margins negative, 0/18 lymph nodes involved, pT3 pN0, MMR preserved.   2.  Social/family history: - Lives at home with his wife and is independent of ADLs and IADLs.  He is now working at a [employer].  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  Current active smoker, 1 pack/day, started at age 68. - 2-3 maternal aunts had cancer.  He does not know the type.    Plan: 1.  Stage II (T3 N0 G2) cecal adenocarcinoma: - He denies any change in bowel habits.  No melena.  Occasional bleeding from hemorrhoids. - Labs from 03/01/2023: Normal LFTs.  CBC was normal.  CEA is 1.7. - Signatera testing from 12/28/2022 was undetectable. - Recommend follow-up in 3 months with repeat CEA, Signatera test and CT CAP with contrast to follow-up on the lung nodules.     Orders Placed This Encounter  Procedures   CT CHEST ABDOMEN PELVIS W CONTRAST    Standing Status:   Future    Standing Expiration Date:   03/07/2024    Order Specific Question:   If indicated for the ordered procedure, I authorize the administration of contrast media per Radiology protocol    Answer:   Yes    Order Specific Question:   Does the patient have a contrast media/X-ray dye allergy?    Answer:   No    Order  Specific Question:   Preferred imaging location?    Answer:   [facility]    Order Specific Question:   If indicated for the ordered procedure, I authorize the administration of oral contrast media per Radiology protocol    Answer:   Yes   CBC with Differential    Standing Status:   Future    Standing Expiration Date:   03/07/2024   Comprehensive metabolic panel    Standing Status:   Future    Standing Expiration Date:   03/07/2024   CEA    Standing Status:   Future    Standing Expiration Date:   03/07/2024      I,Katie Daubenspeck,acting as a scribe for Doreatha Massed, MD.,have documented all relevant documentation on the behalf of Doreatha Massed, MD,as directed by  Doreatha Massed, MD while in the presence of Doreatha Massed, MD.   I, Doreatha Massed MD, have reviewed the above documentation for accuracy and completeness, and I agree with the above.   Doreatha Massed, MD   10/23/20244:55 PM  CHIEF COMPLAINT:   Diagnosis: cecal cancer    Cancer Staging  Cecal cancer [name] Staging form: Colon and Rectum, AJCC 8th Edition - Clinical stage from 11/02/2022: Stage IIA (cT3, cN0, cM0) - Unsigned    Prior Therapy: right hemicolectomy, 10/07/22   Current Therapy: Surveillance   HISTORY OF PRESENT ILLNESS:   Oncology History   No history exists.  INTERVAL HISTORY:   Vincent Griffin is a 62 y.o. male presenting to clinic today for follow up of cecal cancer. He was last seen by me on 11/29/22.  Today, he states that he is doing well overall. His appetite level is at 75%. His energy level is at 75%.  PAST MEDICAL HISTORY:   Past Medical History: Past Medical History:  Diagnosis Date   CAD (coronary artery disease)    History of kidney stones    Hypercholesterolemia    Hypertension    Myocardial infarction [facility]) 2022    Surgical History: Past Surgical History:  Procedure Laterality Date   BIOPSY  09/08/2022   Procedure: BIOPSY;  Surgeon:  [name], DO;  [facility];  Service: Endoscopy;;   CARDIAC CATHETERIZATION  07/2020   with stent.   CATARACT EXTRACTION     CATARACT EXTRACTION W/PHACO Left 01/07/2016   Procedure: CATARACT EXTRACTION PHACO AND INTRAOCULAR LENS PLACEMENT LEFT EYE CDE=13.32;  Surgeon: [name], MD;  Location: [facility] ORS;  Service: Ophthalmology;  Laterality: Left;   COLONOSCOPY WITH PROPOFOL N/A 09/08/2022   Procedure: COLONOSCOPY WITH PROPOFOL;  Surgeon: [name], DO;  Location: [facility];  Service: Endoscopy;  Laterality: N/A;  11:30 am   POLYPECTOMY  09/08/2022   Procedure: POLYPECTOMY;  Surgeon: [name], DO;  Location: [facility];  Service: Endoscopy;;   SMALL INTESTINE SURGERY  1972   SUBMUCOSAL TATTOO INJECTION  09/08/2022   Procedure: SUBMUCOSAL TATTOO INJECTION;  Surgeon: [name], DO;  [facility];  Service: Endoscopy;;    Social History: Social History   Socioeconomic History   Marital status: Married    Spouse name: Not on file   Number of children: Not on file   Years of education: Not on file   Highest education level: Not on file  Occupational History   Not on file  Tobacco Use   Smoking status: Heavy Smoker    Current packs/day: 1.00    Types: Cigarettes   Smokeless tobacco: Never  Substance and Sexual Activity   Alcohol use: No   Drug use: No   Sexual activity: Not on file  Other Topics Concern   Not on file  Social History Narrative   Not on file   Social Determinants of Health   Financial Resource Strain: Low Risk  (08/12/2020)   Received from [facility], [facility]   Overall Financial Resource Strain (CARDIA)    Difficulty of Paying Living Expenses: Not hard at all  Food Insecurity: No Food Insecurity (10/14/2022)   Hunger Vital Sign    Worried About Running Out of Food in the Last Year: Never true    Ran Out of Food in the Last Year: Never true  Transportation Needs: No Transportation Needs  (10/14/2022)   PRAPARE - Administrator, Civil Service (Medical): No    Lack of Transportation (Non-Medical): No  Physical Activity: Not on file  Stress: No Stress Concern Present (08/12/2020)   Received from [facility], [facility] - Occupational Stress Questionnaire    Feeling of Stress : Not at all  Social Connections: Unknown (09/20/2021)   Received from [facility], [facility]   Social Network    Social Network: Not on file  Intimate Partner Violence: Not At Risk (10/07/2022)   Humiliation, Afraid, Rape, and Kick questionnaire    Fear of Current or Ex-Partner: No    Emotionally Abused: No  Physically Abused: No    Sexually Abused: No    Family History: Family History  Problem Relation Age of Onset   Diabetes Mother    Stroke Mother    Colon polyps Brother    Colon cancer Neg Hx     Current Medications:  Current Outpatient Medications:    aspirin 81 MG chewable tablet, Chew 81 mg by mouth daily., Disp: , Rfl:    atorvastatin (LIPITOR) 80 MG tablet, Take 80 mg by mouth at bedtime., Disp: , Rfl:    metoprolol succinate (TOPROL-XL) 25 MG 24 hr tablet, Take 25 mg by mouth daily., Disp: , Rfl:    nitroGLYCERIN (NITROSTAT) 0.4 MG SL tablet, Place 0.4 mg under the tongue every 5 (five) minutes as needed for chest pain. (Patient not taking: Reported on 03/08/2023), Disp: , Rfl:    oxyCODONE (ROXICODONE) 5 MG immediate release tablet, Take 1 tablet (5 mg total) by mouth every 4 (four) hours as needed. (Patient not taking: Reported on 03/08/2023), Disp: 20 tablet, Rfl: 0   Allergies: Allergies  Allergen Reactions   Zetia [Ezetimibe] Diarrhea and Nausea Only    Upset stomach    REVIEW OF SYSTEMS:   Review of Systems  Constitutional:  Positive for fatigue. Negative for chills and fever.  HENT:   Negative for lump/mass, mouth sores, nosebleeds, sore throat and trouble swallowing.   Eyes:  Negative for eye problems.   Respiratory:  Negative for cough and shortness of breath.   Cardiovascular:  Negative for chest pain, leg swelling and palpitations.  Gastrointestinal:  Negative for abdominal pain, constipation, diarrhea, nausea and vomiting.  Genitourinary:  Negative for bladder incontinence, difficulty urinating, dysuria, frequency, hematuria and nocturia.   Musculoskeletal:  Negative for arthralgias, back pain, flank pain, myalgias and neck pain.  Skin:  Negative for itching and rash.  Neurological:  Negative for dizziness, headaches and numbness.  Hematological:  Does not bruise/bleed easily.  Psychiatric/Behavioral:  Negative for depression, sleep disturbance and suicidal ideas. The patient is not nervous/anxious.   All other systems reviewed and are negative.    VITALS:   Blood pressure 130/80, pulse 64, temperature 97.7 F (36.5 C), temperature source Oral, resp. rate 18, weight 298 lb 1.6 oz (135.2 kg), SpO2 94%.  Wt Readings from Last 3 Encounters:  03/08/23 298 lb 1.6 oz (135.2 kg)  11/29/22 297 lb 1.6 oz (134.8 kg)  11/03/22 290 lb 9.6 oz (131.8 kg)    Body mass index is 44.02 kg/m.  Performance status (ECOG): 1 - Symptomatic but completely ambulatory  PHYSICAL EXAM:   Physical Exam Vitals and nursing note reviewed. Exam conducted with a chaperone present.  Constitutional:      Appearance: Normal appearance.  Cardiovascular:     Rate and Rhythm: Normal rate and regular rhythm.     Pulses: Normal pulses.     Heart sounds: Normal heart sounds.  Pulmonary:     Effort: Pulmonary effort is normal.     Breath sounds: Normal breath sounds.  Abdominal:     Palpations: Abdomen is soft. There is no hepatomegaly, splenomegaly or mass.     Tenderness: There is no abdominal tenderness.  Musculoskeletal:     Right lower leg: No edema.     Left lower leg: No edema.  Lymphadenopathy:     Cervical: No cervical adenopathy.     Right cervical: No superficial, deep or posterior cervical  adenopathy.    Left cervical: No superficial, deep or posterior cervical adenopathy.  Upper Body:     Right upper body: No supraclavicular or axillary adenopathy.     Left upper body: No supraclavicular or axillary adenopathy.  Neurological:     General: No focal deficit present.     Mental Status: He is alert and oriented to person, place, and time.  Psychiatric:        Mood and Affect: Mood normal.        Behavior: Behavior normal.     LABS:      Latest Ref Rng & Units 03/01/2023    9:42 AM 11/03/2022    1:41 PM 10/09/2022    5:01 AM  CBC  WBC 4.0 - 10.5 K/uL 6.2  8.5  12.0   Hemoglobin 13.0 - 17.0 g/dL 86.5  78.4  69.6   Hematocrit 39.0 - 52.0 % 45.9  41.4  32.6   Platelets 150 - 400 K/uL 209  291  230       Latest Ref Rng & Units 03/01/2023    9:42 AM 11/03/2022    1:41 PM 10/09/2022    5:01 AM  CMP  Glucose 70 - 99 mg/dL 93  92  295   BUN 6 - 20 mg/dL 16  10  13    Creatinine 0.61 - 1.24 mg/dL 2.84  1.32  4.40   Sodium 135 - 145 mmol/L 137  136  134   Potassium 3.5 - 5.1 mmol/L 4.4  4.6  4.6   Chloride 98 - 111 mmol/L 101  102  100   CO2 22 - 32 mmol/L 30  26  25    Calcium 8.9 - 10.3 mg/dL 8.8  8.9  8.1   Total Protein 6.5 - 8.1 g/dL 7.2  7.1    Total Bilirubin 0.3 - 1.2 mg/dL 0.8  0.5    Alkaline Phos 38 - 126 U/L 84  101    AST 15 - 41 U/L 25  28    ALT 0 - 44 U/L 24  30       Lab Results  Component Value Date   CEA1 1.7 03/01/2023   /  CEA  Date Value Ref Range Status  03/01/2023 1.7 0.0 - 4.7 ng/mL Final    Comment:    (NOTE)                             Nonsmokers          <3.9                             Smokers             <5.6 Roche [employer] Electrochemiluminescence Immunoassay (ECLIA) Values obtained with different assay methods or kits cannot be used interchangeably.  Results cannot be interpreted as absolute evidence of the presence or absence of malignant disease. Performed At: [facility] [address] [place], [place]  [id number] Jolene Schimke MD [id number]:[id number]    Lab Results  Component Value Date   PSA1 0.5 06/05/2020   No results found for: "LOV564" No results found for: "CAN125"  No results found for: "TOTALPROTELP", "ALBUMINELP", "A1GS", "A2GS", "BETS", "BETA2SER", "GAMS", "MSPIKE", "SPEI" Lab Results  Component Value Date   TIBC 352 11/03/2022   FERRITIN 50 11/03/2022   IRONPCTSAT 32 11/03/2022   No results found for: "LDH"   STUDIES:   No results found.

## 2023-04-04 ENCOUNTER — Telehealth: Payer: Self-pay

## 2023-04-04 NOTE — Telephone Encounter (Signed)
Patient called reporting intermittent rectal bleeding. Patient states that it is not with every BM, but when it does occur it is bright red blood. Patient states that he otherwise feels fine and reports no pain. Discussed with Rojelio Brenner, PA, who recommends patient have a colonoscopy or go directly to the ED if it becomes frank bleeding, constant bleeding or large amounts of blood per BM. I have contacted Dr. Marletta Lor and Dr. Lovell Sheehan. Dr. Queen Blossom APP, Tobi Bastos, will see the patient tomorrow morning at 0900 to discuss next steps. Patient made aware via telephone as well as mychart message per his request.

## 2023-04-05 ENCOUNTER — Encounter: Payer: Self-pay | Admitting: Gastroenterology

## 2023-04-05 ENCOUNTER — Encounter: Payer: Self-pay | Admitting: *Deleted

## 2023-04-05 ENCOUNTER — Ambulatory Visit (INDEPENDENT_AMBULATORY_CARE_PROVIDER_SITE_OTHER): Payer: BC Managed Care – PPO | Admitting: Gastroenterology

## 2023-04-05 VITALS — BP 126/86 | HR 80 | Temp 97.1°F | Ht 69.0 in | Wt 301.3 lb

## 2023-04-05 DIAGNOSIS — C182 Malignant neoplasm of ascending colon: Secondary | ICD-10-CM

## 2023-04-05 DIAGNOSIS — C180 Malignant neoplasm of cecum: Secondary | ICD-10-CM

## 2023-04-05 DIAGNOSIS — K625 Hemorrhage of anus and rectum: Secondary | ICD-10-CM | POA: Diagnosis not present

## 2023-04-05 MED ORDER — HYDROCORTISONE (PERIANAL) 2.5 % EX CREA: 1.0000 | TOPICAL_CREAM | Freq: Two times a day (BID) | CUTANEOUS | 1 refills | Status: DC

## 2023-04-05 NOTE — H&P (View-Only) (Signed)
Gastroenterology Office Note     Primary Care Physician:  Junie Spencer, FNP  Primary Gastroenterologist: Dr. Marletta Lor    Chief Complaint   Chief Complaint  Patient presents with   Rectal Bleeding    Patient here today due to seeing bright red blood in stools, toilet and tissue. He says he has seen this off and on for the last three weeks. Patient with history of colon cancer.      History of Present Illness   Vincent Griffin is a 62 y.o. 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 Presents urgently today.    Rectal bleeding past 3 weeks. Intermittent. Has had dripping in the floor at work. Underwear will be red. No rectal pain. Last week has had rectal itching. Cleaned up and itching left. No abdominal pain. Last bleeding yesterday small amount. No constipation. More on the looser side after surgery. Chronic fatigue. No abdominal pain.   Colonoscopy April 2024: non-bleeding internal hemorrhoids, diverticulosis, malignant partially-obstructing cecal tumor, seven 5-8 mm polyps in sigmoid and transverse. Stool in sigmoid and descending colon. Path with invasive moderately differentiated adenocarcinoma, tubular adenomas, hyperplastic polyp.   Past Medical History:  Diagnosis Date   CAD (coronary artery disease)    History of kidney stones    Hypercholesterolemia    Hypertension    Myocardial infarction [facility]) 2022    Past Surgical History:  Procedure Laterality Date   BIOPSY  09/08/2022   Procedure: BIOPSY;  Surgeon: [name], DO;  [facility];  Service: Endoscopy;;   CARDIAC CATHETERIZATION  07/2020   with stent.   CATARACT EXTRACTION     CATARACT EXTRACTION W/PHACO Left 01/07/2016   Procedure: CATARACT EXTRACTION PHACO AND INTRAOCULAR LENS PLACEMENT LEFT EYE CDE=13.32;  Surgeon: [name], MD;  Location:  [facility];  Service: Ophthalmology;  Laterality: Left;   COLONOSCOPY WITH PROPOFOL N/A 09/08/2022   Procedure: COLONOSCOPY WITH PROPOFOL;  Surgeon: [name], DO;  Location: [facility];  Service: Endoscopy;  Laterality: N/A;  11:30 am   POLYPECTOMY  09/08/2022   Procedure: POLYPECTOMY;  Surgeon: [name], DO;  [facility];  Service: Endoscopy;;   SMALL INTESTINE SURGERY  1972   SUBMUCOSAL TATTOO INJECTION  09/08/2022   Procedure: SUBMUCOSAL TATTOO INJECTION;  Surgeon: [name], DO;  [facility];  Service: Endoscopy;;    Current Outpatient Medications  Medication Sig Dispense Refill   aspirin 81 MG chewable tablet Chew 81 mg by mouth daily.     atorvastatin (LIPITOR) 80 MG tablet Take 80 mg by mouth at bedtime.     metoprolol succinate (TOPROL-XL) 25 MG 24 hr tablet Take 25 mg by mouth daily.     nitroGLYCERIN (NITROSTAT) 0.4 MG SL tablet Place 0.4 mg under the tongue every 5 (five) minutes as needed for chest pain.     No current facility-administered medications for this visit.    Allergies as of 04/05/2023 - Review Complete 04/05/2023  Allergen Reaction Noted   Zetia [ezetimibe] Diarrhea and Nausea Only 09/01/2022    Family History  Problem Relation Age of Onset   Diabetes Mother    Stroke Mother    Colon polyps Brother    Colon cancer Neg Hx     Social History   Socioeconomic History   Marital status: Married  Spouse name: Not on file   Number of children: Not on file   Years of education: Not on file   Highest education level: Not on file  Occupational History   Not on file  Tobacco Use   Smoking status: Heavy Smoker    Current packs/day: 1.00    Types: Cigarettes   Smokeless tobacco: Never  Vaping Use   Vaping status: Never Used  Substance and Sexual Activity   Alcohol use: No   Drug use: No   Sexual activity: Not on file  Other Topics Concern   Not on file  Social History Narrative   Not on file   Social  Determinants of Health   Financial Resource Strain: Low Risk  (08/12/2020)   Received from [facility], [facility]   Overall Financial Resource Strain (CARDIA)    Difficulty of Paying Living Expenses: Not hard at all  Food Insecurity: No Food Insecurity (10/14/2022)   Hunger Vital Sign    Worried About Running Out of Food in the Last Year: Never true    Ran Out of Food in the Last Year: Never true  Transportation Needs: No Transportation Needs (10/14/2022)   PRAPARE - Administrator, Civil Service (Medical): No    Lack of Transportation (Non-Medical): No  Physical Activity: Not on file  Stress: No Stress Concern Present (08/12/2020)   Received from [facility], [facility] of Occupational Health - Occupational Stress Questionnaire    Feeling of Stress : Not at all  Social Connections: Unknown (09/20/2021)   Received from [facility], [facility]   Social Network    Social Network: Not on file  Intimate Partner Violence: Not At Risk (10/07/2022)   Humiliation, Afraid, Rape, and Kick questionnaire    Fear of Current or Ex-Partner: No    Emotionally Abused: No    Physically Abused: No    Sexually Abused: No     Review of Systems   Gen: Denies any fever, chills, fatigue, weight loss, lack of appetite.  CV: Denies chest pain, heart palpitations, peripheral edema, syncope.  Resp: Denies shortness of breath at rest or with exertion. Denies wheezing or cough.  GI: Denies dysphagia or odynophagia. Denies jaundice, hematemesis, fecal incontinence. GU : Denies urinary burning, urinary frequency, urinary hesitancy MS: Denies joint pain, muscle weakness, cramps, or limitation of movement.  Derm: Denies rash, itching, dry skin Psych: Denies depression, anxiety, memory loss, and confusion Heme: Denies bruising, bleeding, and enlarged lymph nodes.   Physical Exam   BP 126/86 (BP Location: Right Arm, Patient Position: Sitting, Cuff Size: Large)    Pulse 80   Temp (!) 97.1 F (36.2 C) (Temporal)   Ht 5\' 9"  (1.753 m)   Wt (!) 301 lb 4.8 oz (136.7 kg)   BMI 44.49 kg/m  General:   Alert and oriented. Pleasant and cooperative. Well-nourished and well-developed.  Head:  Normocephalic and atraumatic. Eyes:  Without icterus Abdomen:  +BS, soft, non-tender and non-distended. No HSM noted. No guarding or rebound. No masses appreciated.  Rectal:  no external hemorrhoids. DRE without mass. Soft stool in rectal vault. No impaction Msk:  Symmetrical without gross deformities. Normal posture. Extremities:  Without edema. Neurologic:  Alert and  oriented x4;  grossly normal neurologically. Skin:  Intact without significant lesions or rashes. Psych:  Alert and cooperative. Normal mood and affect.   Assessment   Vincent Griffin is a 62 y.o. 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   Painless hematochezia noted for several weeks, alternating in frequency and amount. Suspect hemorrhoidal, unable to rule out stuttering diverticular bleed, doubt bleeding from anastomosis as surgery was in May, need to rule out recurrent carcinoma.   Timely colonoscopy planned for next week. To ED if any hemodynamically significant bleeding.    PLAN    CBC today Anusol BID per rectum Proceed with colonoscopy by Dr. Marletta Lor expeditiously: the risks, benefits, and alternatives have been discussed with the patient in detail. The patient states understanding and desires to proceed.  May be a good hemorrhoid banding candidate if deemed hemorrhoid-related   Gelene Mink, PhD, ANP-BC [facility] Gastroenterology

## 2023-04-05 NOTE — Progress Notes (Signed)
Gastroenterology Office Note     Primary Care Physician:  Junie Spencer, FNP  Primary Gastroenterologist: Dr. Marletta Lor    Chief Complaint   Chief Complaint  Patient presents with   Rectal Bleeding    Patient here today due to seeing bright red blood in stools, toilet and tissue. He says he has seen this off and on for the last three weeks. Patient with history of colon cancer.      History of Present Illness   Vincent Griffin is a 62 y.o. 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 Presents urgently today.    Rectal bleeding past 3 weeks. Intermittent. Has had dripping in the floor at work. Underwear will be red. No rectal pain. Last week has had rectal itching. Cleaned up and itching left. No abdominal pain. Last bleeding yesterday small amount. No constipation. More on the looser side after surgery. Chronic fatigue. No abdominal pain.   Colonoscopy April 2024: non-bleeding internal hemorrhoids, diverticulosis, malignant partially-obstructing cecal tumor, seven 5-8 mm polyps in sigmoid and transverse. Stool in sigmoid and descending colon. Path with invasive moderately differentiated adenocarcinoma, tubular adenomas, hyperplastic polyp.   Past Medical History:  Diagnosis Date   CAD (coronary artery disease)    History of kidney stones    Hypercholesterolemia    Hypertension    Myocardial infarction [facility]) 2022    Past Surgical History:  Procedure Laterality Date   BIOPSY  09/08/2022   Procedure: BIOPSY;  Surgeon: [name], DO;  [facility]: [facility];  Service: Endoscopy;;   CARDIAC CATHETERIZATION  07/2020   with stent.   CATARACT EXTRACTION     CATARACT EXTRACTION W/PHACO Left 01/07/2016   Procedure: CATARACT EXTRACTION PHACO AND INTRAOCULAR LENS PLACEMENT LEFT EYE CDE=13.32;  Surgeon: [name], MD;  Location:  [facility];  Service: Ophthalmology;  Laterality: Left;   COLONOSCOPY WITH PROPOFOL N/A 09/08/2022   Procedure: COLONOSCOPY WITH PROPOFOL;  Surgeon: [name], DO;  Location: [facility];  Service: Endoscopy;  Laterality: N/A;  11:30 am   POLYPECTOMY  09/08/2022   Procedure: POLYPECTOMY;  Surgeon: [name], DO;  Location: [facility];  Service: Endoscopy;;   SMALL INTESTINE SURGERY  1972   SUBMUCOSAL TATTOO INJECTION  09/08/2022   Procedure: SUBMUCOSAL TATTOO INJECTION;  Surgeon: [name], DO;  Location: [facility];  Service: Endoscopy;;    Current Outpatient Medications  Medication Sig Dispense Refill   aspirin 81 MG chewable tablet Chew 81 mg by mouth daily.     atorvastatin (LIPITOR) 80 MG tablet Take 80 mg by mouth at bedtime.     metoprolol succinate (TOPROL-XL) 25 MG 24 hr tablet Take 25 mg by mouth daily.     nitroGLYCERIN (NITROSTAT) 0.4 MG SL tablet Place 0.4 mg under the tongue every 5 (five) minutes as needed for chest pain.     No current facility-administered medications for this visit.    Allergies as of 04/05/2023 - Review Complete 04/05/2023  Allergen Reaction Noted   Zetia [ezetimibe] Diarrhea and Nausea Only 09/01/2022    Family History  Problem Relation Age of Onset   Diabetes Mother    Stroke Mother    Colon polyps Brother    Colon cancer Neg Hx     Social History   Socioeconomic History   Marital status: Married  Spouse name: Not on file   Number of children: Not on file   Years of education: Not on file   Highest education level: Not on file  Occupational History   Not on file  Tobacco Use   Smoking status: Heavy Smoker    Current packs/day: 1.00    Types: Cigarettes   Smokeless tobacco: Never  Vaping Use   Vaping status: Never Used  Substance and Sexual Activity   Alcohol use: No   Drug use: No   Sexual activity: Not on file  Other Topics Concern   Not on file  Social History Narrative   Not on file   Social  Determinants of Health   Financial Resource Strain: Low Risk  (08/12/2020)   Received from [facility]   Overall Financial Resource Strain (CARDIA)    Difficulty of Paying Living Expenses: Not hard at all  Food Insecurity: No Food Insecurity (10/14/2022)   Hunger Vital Sign    Worried About Running Out of Food in the Last Year: Never true    Ran Out of Food in the Last Year: Never true  Transportation Needs: No Transportation Needs (10/14/2022)   PRAPARE - Administrator, Civil Service (Medical): No    Lack of Transportation (Non-Medical): No  Physical Activity: Not on file  Stress: No Stress Concern Present (08/12/2020)   Received from [facility] of Occupational Health - Occupational Stress Questionnaire    Feeling of Stress : Not at all  Social Connections: Unknown (09/20/2021)   Received from [facility], [facility]   Social Network    Social Network: Not on file  Intimate Partner Violence: Not At Risk (10/07/2022)   Humiliation, Afraid, Rape, and Kick questionnaire    Fear of Current or Ex-Partner: No    Emotionally Abused: No    Physically Abused: No    Sexually Abused: No     Review of Systems   Gen: Denies any fever, chills, fatigue, weight loss, lack of appetite.  CV: Denies chest pain, heart palpitations, peripheral edema, syncope.  Resp: Denies shortness of breath at rest or with exertion. Denies wheezing or cough.  GI: Denies dysphagia or odynophagia. Denies jaundice, hematemesis, fecal incontinence. GU : Denies urinary burning, urinary frequency, urinary hesitancy MS: Denies joint pain, muscle weakness, cramps, or limitation of movement.  Derm: Denies rash, itching, dry skin Psych: Denies depression, anxiety, memory loss, and confusion Heme: Denies bruising, bleeding, and enlarged lymph nodes.   Physical Exam   BP 126/86 (BP Location: Right Arm, Patient Position: Sitting, Cuff Size: Large)    Pulse 80   Temp (!) 97.1 F (36.2 C) (Temporal)   Ht 5\' 9"  (1.753 m)   Wt (!) 301 lb 4.8 oz (136.7 kg)   BMI 44.49 kg/m  General:   Alert and oriented. Pleasant and cooperative. Well-nourished and well-developed.  Head:  Normocephalic and atraumatic. Eyes:  Without icterus Abdomen:  +BS, soft, non-tender and non-distended. No HSM noted. No guarding or rebound. No masses appreciated.  Rectal:  no external hemorrhoids. DRE without mass. Soft stool in rectal vault. No impaction Msk:  Symmetrical without gross deformities. Normal posture. Extremities:  Without edema. Neurologic:  Alert and  oriented x4;  grossly normal neurologically. Skin:  Intact without significant lesions or rashes. Psych:  Alert and cooperative. Normal mood and affect.   Assessment   Vincent Griffin is a 62 y.o. 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   Painless hematochezia noted for several weeks, alternating in frequency and amount. Suspect hemorrhoidal, unable to rule out stuttering diverticular bleed, doubt bleeding from anastomosis as surgery was in May, need to rule out recurrent carcinoma.   Timely colonoscopy planned for next week. To ED if any hemodynamically significant bleeding.    PLAN    CBC today Anusol BID per rectum Proceed with colonoscopy by Dr. Marletta Lor expeditiously: the risks, benefits, and alternatives have been discussed with the patient in detail. The patient states understanding and desires to proceed.  May be a good hemorrhoid banding candidate if deemed hemorrhoid-related   Gelene Mink, PhD, ANP-BC [facility] Gastroenterology

## 2023-04-05 NOTE — Patient Instructions (Signed)
We are arranging a colonoscopy with Dr. Marletta Lor for next week.  Please have blood work done today.   If you have significant bleeding and feel Kleinsasser-headed, dizzy, etc, please go to the emergency room!  I have sent in a cream to use per rectum in case of internal hemorrhoids.    It was a pleasure to see you today. I want to create trusting relationships with patients and provide genuine, compassionate, and quality care. I truly value your feedback, so please be on the lookout for a survey regarding your visit with me today. I appreciate your time in completing this!         Gelene Mink, PhD, ANP-BC [facility] Gastroenterology

## 2023-04-06 ENCOUNTER — Other Ambulatory Visit: Payer: Self-pay

## 2023-04-06 ENCOUNTER — Encounter (HOSPITAL_COMMUNITY)
Admission: RE | Admit: 2023-04-06 | Discharge: 2023-04-06 | Disposition: A | Discharge status: Home / Self Care | Payer: BC Managed Care – PPO | Source: Ambulatory Visit | Attending: Internal Medicine | Admitting: Internal Medicine

## 2023-04-06 ENCOUNTER — Encounter (HOSPITAL_COMMUNITY): Payer: Self-pay

## 2023-04-06 LAB — CBC WITH DIFFERENTIAL/PLATELET
Basophils Absolute: 0.1 10*3/uL (ref 0.0–0.2)
Basos: 1 %
EOS (ABSOLUTE): 0.3 10*3/uL (ref 0.0–0.4)
Eos: 4 %
Hematocrit: 44.4 % (ref 37.5–51.0)
Hemoglobin: 14.6 g/dL (ref 13.0–17.7)
Immature Grans (Abs): 0 10*3/uL (ref 0.0–0.1)
Immature Granulocytes: 0 %
Lymphocytes Absolute: 1.4 10*3/uL (ref 0.7–3.1)
Lymphs: 21 %
MCH: 32.2 pg (ref 26.6–33.0)
MCHC: 32.9 g/dL (ref 31.5–35.7)
MCV: 98 fL — ABNORMAL HIGH (ref 79–97)
Monocytes Absolute: 0.5 10*3/uL (ref 0.1–0.9)
Monocytes: 8 %
Neutrophils Absolute: 4.5 10*3/uL (ref 1.4–7.0)
Neutrophils: 66 %
Platelets: 229 10*3/uL (ref 150–450)
RBC: 4.54 x10E6/uL (ref 4.14–5.80)
RDW: 13.1 % (ref 11.6–15.4)
WBC: 6.8 10*3/uL (ref 3.4–10.8)

## 2023-04-10 ENCOUNTER — Encounter (HOSPITAL_COMMUNITY)
Admission: RE | Disposition: A | Discharge status: Home / Self Care | Payer: Self-pay | Source: Home / Self Care | Attending: Internal Medicine

## 2023-04-10 ENCOUNTER — Encounter (HOSPITAL_COMMUNITY): Payer: Self-pay

## 2023-04-10 ENCOUNTER — Other Ambulatory Visit: Payer: Self-pay

## 2023-04-10 ENCOUNTER — Ambulatory Visit (HOSPITAL_COMMUNITY)
Admission: RE | Admit: 2023-04-10 | Discharge: 2023-04-10 | Disposition: A | Discharge status: Home / Self Care | Payer: BC Managed Care – PPO | Attending: Internal Medicine | Admitting: Internal Medicine

## 2023-04-10 ENCOUNTER — Ambulatory Visit (HOSPITAL_COMMUNITY): Payer: BC Managed Care – PPO | Admitting: Anesthesiology

## 2023-04-10 DIAGNOSIS — K648 Other hemorrhoids: Secondary | ICD-10-CM | POA: Diagnosis not present

## 2023-04-10 DIAGNOSIS — Z85038 Personal history of other malignant neoplasm of large intestine: Secondary | ICD-10-CM | POA: Insufficient documentation

## 2023-04-10 DIAGNOSIS — K633 Ulcer of intestine: Secondary | ICD-10-CM | POA: Insufficient documentation

## 2023-04-10 DIAGNOSIS — K635 Polyp of colon: Secondary | ICD-10-CM | POA: Insufficient documentation

## 2023-04-10 DIAGNOSIS — Z9049 Acquired absence of other specified parts of digestive tract: Secondary | ICD-10-CM | POA: Diagnosis not present

## 2023-04-10 DIAGNOSIS — E66813 Obesity, class 3: Secondary | ICD-10-CM | POA: Insufficient documentation

## 2023-04-10 DIAGNOSIS — I10 Essential (primary) hypertension: Secondary | ICD-10-CM | POA: Insufficient documentation

## 2023-04-10 DIAGNOSIS — I2510 Atherosclerotic heart disease of native coronary artery without angina pectoris: Secondary | ICD-10-CM | POA: Diagnosis not present

## 2023-04-10 DIAGNOSIS — I252 Old myocardial infarction: Secondary | ICD-10-CM | POA: Diagnosis not present

## 2023-04-10 DIAGNOSIS — Z6841 Body Mass Index (BMI) 40.0 and over, adult: Secondary | ICD-10-CM | POA: Insufficient documentation

## 2023-04-10 DIAGNOSIS — K625 Hemorrhage of anus and rectum: Secondary | ICD-10-CM | POA: Diagnosis not present

## 2023-04-10 DIAGNOSIS — K5730 Diverticulosis of large intestine without perforation or abscess without bleeding: Secondary | ICD-10-CM | POA: Diagnosis not present

## 2023-04-10 DIAGNOSIS — Z955 Presence of coronary angioplasty implant and graft: Secondary | ICD-10-CM | POA: Diagnosis not present

## 2023-04-10 DIAGNOSIS — F17210 Nicotine dependence, cigarettes, uncomplicated: Secondary | ICD-10-CM | POA: Insufficient documentation

## 2023-04-10 DIAGNOSIS — Z980 Intestinal bypass and anastomosis status: Secondary | ICD-10-CM | POA: Diagnosis not present

## 2023-04-10 HISTORY — PX: BIOPSY: SHX5522

## 2023-04-10 HISTORY — PX: COLONOSCOPY WITH PROPOFOL: SHX5780

## 2023-04-10 HISTORY — PX: POLYPECTOMY: SHX5525

## 2023-04-10 SURGERY — COLONOSCOPY WITH PROPOFOL
Anesthesia: General

## 2023-04-10 MED ORDER — PROPOFOL 500 MG/50ML IV EMUL
INTRAVENOUS | Status: DC | PRN
  Administered 2023-04-10: 200 ug/kg/min via INTRAVENOUS

## 2023-04-10 MED ORDER — LACTATED RINGERS IV SOLN: INTRAVENOUS | Status: DC | PRN

## 2023-04-10 MED ORDER — EPHEDRINE SULFATE (PRESSORS) 50 MG/ML IJ SOLN
INTRAMUSCULAR | Status: DC | PRN
  Administered 2023-04-10 (×2): 5 mg via INTRAVENOUS
  Administered 2023-04-10: 10 mg via INTRAVENOUS
  Administered 2023-04-10: 5 mg via INTRAVENOUS

## 2023-04-10 MED ORDER — PROPOFOL 10 MG/ML IV BOLUS
INTRAVENOUS | Status: DC | PRN
  Administered 2023-04-10: 100 mg via INTRAVENOUS

## 2023-04-10 NOTE — Discharge Instructions (Addendum)
  Colonoscopy Discharge Instructions  Read the instructions outlined below and refer to this sheet in the next few weeks. These discharge instructions provide you with general information on caring for yourself after you leave the hospital. Your doctor may also give you specific instructions. While your treatment has been planned according to the most current medical practices available, unavoidable complications occasionally occur.   ACTIVITY You may resume your regular activity, but move at a slower pace for the next 24 hours.  Take frequent rest periods for the next 24 hours.  Walking will help get rid of the air and reduce the bloated feeling in your belly (abdomen).  No driving for 24 hours (because of the medicine (anesthesia) used during the test).   Do not sign any important legal documents or operate any machinery for 24 hours (because of the anesthesia used during the test).  NUTRITION Drink plenty of fluids.  You may resume your normal diet as instructed by your doctor.  Begin with a Hashem meal and progress to your normal diet. Heavy or fried foods are harder to digest and may make you feel sick to your stomach (nauseated).  Avoid alcoholic beverages for 24 hours or as instructed.  MEDICATIONS You may resume your normal medications unless your doctor tells you otherwise.  WHAT YOU CAN EXPECT TODAY Some feelings of bloating in the abdomen.  Passage of more gas than usual.  Spotting of blood in your stool or on the toilet paper.  IF YOU HAD POLYPS REMOVED DURING THE COLONOSCOPY: No aspirin products for 7 days or as instructed.  No alcohol for 7 days or as instructed.  Eat a soft diet for the next 24 hours.  FINDING OUT THE RESULTS OF YOUR TEST Not all test results are available during your visit. If your test results are not back during the visit, make an appointment with your caregiver to find out the results. Do not assume everything is normal if you have not heard from your  caregiver or the medical facility. It is important for you to follow up on all of your test results.  SEEK IMMEDIATE MEDICAL ATTENTION IF: You have more than a spotting of blood in your stool.  Your belly is swollen (abdominal distention).  You are nauseated or vomiting.  You have a temperature over 101.  You have abdominal pain or discomfort that is severe or gets worse throughout the day.   Your colonoscopy revealed actively oozing internal hemorrhoid.    At the anastomosis where your small bowel connects to your colon, there was a medium size ulcer.  I took numerous biopsies of this area.  We will call with results.  2 small polyps removed.  We may consider hemorrhoid banding to treat your hemorrhoids.  Follow-up in GI office in 4 to 6 weeks.  I hope you have a great rest of your week!  Hennie Duos. Marletta Lor, D.O. Gastroenterology and Hepatology [facility]

## 2023-04-10 NOTE — Transfer of Care (Signed)
Immediate Anesthesia Transfer of Care Note  Patient: Vincent Griffin  Procedure(s) Performed: COLONOSCOPY WITH PROPOFOL POLYPECTOMY BIOPSY  Patient Location: Short Stay  Anesthesia Type:General  Level of Consciousness: awake, alert , and oriented  Airway & Oxygen Therapy: Patient Spontanous Breathing  Post-op Assessment: Report given to RN, Post -op Vital signs reviewed and stable, and Patient moving all extremities X 4  Post vital signs: Reviewed and stable  Last Vitals:  Vitals Value Taken Time  BP 83/60   Temp 36.4 C 04/10/23 1318  Pulse 81 04/10/23 1318  Resp 18 04/10/23 1318  SpO2 94 % 04/10/23 1318    Last Pain:  Vitals:   04/10/23 1318  TempSrc: Oral  PainSc:          Complications: No notable events documented.

## 2023-04-10 NOTE — Anesthesia Preprocedure Evaluation (Addendum)
Anesthesia Evaluation  Patient identified by MRN, date of birth, ID band Patient awake    Reviewed: Allergy & Precautions, H&P , NPO status , Patient's Chart, lab work & pertinent test results  Airway Mallampati: II  TM Distance: >3 FB Neck ROM: Full    Dental no notable dental hx. (+) Missing, Dental Advisory Given,    Pulmonary shortness of breath and with exertion, Current SmokerPatient did not abstain from smoking.   + rhonchi        Cardiovascular Exercise Tolerance: Poor hypertension, + CAD, + Past MI, + Cardiac Stents and + DOE  Normal cardiovascular exam Rhythm:Regular Rate:Normal     Neuro/Psych negative neurological ROS  negative psych ROS   GI/Hepatic negative GI ROS, Neg liver ROS, Bowel prep,,,  Endo/Other    Class 3 obesity  Renal/GU Renal InsufficiencyRenal disease  negative genitourinary   Musculoskeletal negative musculoskeletal ROS (+)    Abdominal   Peds negative pediatric ROS (+)  Hematology negative hematology ROS (+)   Anesthesia Other Findings   Reproductive/Obstetrics negative OB ROS                             Anesthesia Physical Anesthesia Plan  ASA: 3  Anesthesia Plan: General   Post-op Pain Management: Minimal or no pain anticipated   Induction: Intravenous  PONV Risk Score and Plan: Propofol infusion  Airway Management Planned: Nasal Cannula and Natural Airway  Additional Equipment: None  Intra-op Plan:   Post-operative Plan:   Informed Consent: I have reviewed the patients History and Physical, chart, labs and discussed the procedure including the risks, benefits and alternatives for the proposed anesthesia with the patient or authorized representative who has indicated his/her understanding and acceptance.     Dental advisory given  Plan Discussed with: CRNA and Surgeon  Anesthesia Plan Comments:        Anesthesia Quick  Evaluation

## 2023-04-10 NOTE — Interval H&P Note (Signed)
History and Physical Interval Note:  04/10/2023 12:41 PM  Vincent Griffin  has presented today for surgery, with the diagnosis of RB.  The various methods of treatment have been discussed with the patient and family. After consideration of risks, benefits and other options for treatment, the patient has consented to  Procedure(s) with comments: COLONOSCOPY WITH PROPOFOL (N/A) - 100pm, asa 3 as a surgical intervention.  The patient's history has been reviewed, patient examined, no change in status, stable for surgery.  I have reviewed the patient's chart and labs.  Questions were answered to the patient's satisfaction.     [name]

## 2023-04-10 NOTE — Op Note (Signed)
[facility] Patient Name: Vincent Griffin Procedure Date: 04/10/2023 12:47 PM MRN: [id number] Date of Birth: 1960-10-04 Attending MD: Hennie Duos. Marletta Lor , [place], [id number] CSN: [id number] Age: 62 Admit Type: Emergency Department Procedure:                Colonoscopy Indications:              Rectal bleeding Providers:                Hennie Duos. Marletta Lor, DO, Angelica Ran, Lennice Sites                            Technician, Technician Referring MD:              Medicines:                See the Anesthesia note for documentation of the                            administered medications Complications:            No immediate complications. Estimated Blood Loss:     Estimated blood loss was minimal. Procedure:                Pre-Anesthesia Assessment:                           - The anesthesia plan was to use monitored                            anesthesia care (MAC).                           After obtaining informed consent, the colonoscope                            was passed under direct vision. Throughout the                            procedure, the patient's blood pressure, pulse, and                            oxygen saturations were monitored continuously. The                            [id number]-HQ190L ([id number]) scope was introduced through                            the anus and advanced to the the ileocolonic                            anastomosis. The colonoscopy was performed without                            difficulty. The patient tolerated the procedure                            well. The quality of the bowel preparation was  evaluated using the BBPS [facility] Bowel Preparation                            Scale) with scores of: Right Colon = 3, Transverse                            Colon = 3 and Left Colon = 3 (entire mucosa seen                            well with no residual staining, small fragments of                            stool or opaque liquid).  The total BBPS score                            equals 9. Scope In: 12:58:53 PM Scope Out: 1:13:43 PM Scope Withdrawal Time: 0 hours 11 minutes 52 seconds  Total Procedure Duration: 0 hours 14 minutes 50 seconds  Findings:      Bleeding internal hemorrhoids were found during retroflexion. The       hemorrhoids were medium-sized.      Many large-mouthed and small-mouthed diverticula were found in the       sigmoid colon and descending colon.      Two sessile polyps were found in the descending colon and transverse       colon. The polyps were 4 to 6 mm in size. These polyps were removed with       a cold snare. Resection and retrieval were complete.      A single (solitary) 12-15 mm ulcer was found at the anastomosis. No       bleeding was present. No stigmata of recent bleeding were seen. Biopsies       were taken with a cold forceps for histology to rule our malignancy.      There was evidence of a prior side-to-side ileo-colonic anastomosis in       the transverse colon. This was patent. Impression:               - Bleeding internal hemorrhoids.                           - Diverticulosis in the sigmoid colon and in the                            descending colon.                           - Two 4 to 6 mm polyps in the descending colon and                            in the transverse colon, removed with a cold snare.                            Resected and retrieved.                           -  A single (solitary) ulcer at the colonic                            anastomosis. Biopsied.                           - Patent side-to-side ileo-colonic anastomosis. Moderate Sedation:      Per Anesthesia Care Recommendation:           - Patient has a contact number available for                            emergencies. The signs and symptoms of potential                            delayed complications were discussed with the                            patient. Return to normal activities  tomorrow.                            Written discharge instructions were provided to the                            patient.                           - Resume previous diet.                           - Continue present medications.                           - Await pathology results.                           - Repeat colonoscopy date to be determined after                            pending pathology results are reviewed for                            surveillance based on pathology results.                           - Return to GI clinic in 4 weeks. Consider                            hemorrhoid banding if biopsies negative Procedure Code(s):        --- Professional ---                           [phone], Colonoscopy, flexible; with removal of                            tumor(s), polyp(s), or other lesion(s) by snare  technique                           [id number], 59, Colonoscopy, flexible; with biopsy,                            single or multiple Diagnosis Code(s):        --- Professional ---                           K64.8, Other hemorrhoids                           D12.4, Benign neoplasm of descending colon                           D12.3, Benign neoplasm of transverse colon (hepatic                            flexure or splenic flexure)                           K63.3, Ulcer of intestine                           Z98.0, Intestinal bypass and anastomosis status                           K62.5, Hemorrhage of anus and rectum                           K57.30, Diverticulosis of large intestine without                            perforation or abscess without bleeding CPT copyright 2022 American Medical Association. All rights reserved. The codes documented in this report are preliminary and upon coder review may  be revised to meet current compliance requirements. Hennie Duos. Marletta Lor, DO Hennie Duos. Marletta Lor, DO 04/10/2023 1:20:05 PM This report has been signed  electronically. Number of Addenda: 0

## 2023-04-10 NOTE — Anesthesia Postprocedure Evaluation (Signed)
Anesthesia Post Note  Patient: KAHIAU WISON  Procedure(s) Performed: COLONOSCOPY WITH PROPOFOL POLYPECTOMY BIOPSY  Patient location during evaluation: PACU Anesthesia Type: General Level of consciousness: awake and alert Pain management: pain level controlled Vital Signs Assessment: post-procedure vital signs reviewed and stable Respiratory status: spontaneous breathing, nonlabored ventilation, respiratory function stable and patient connected to nasal cannula oxygen Cardiovascular status: blood pressure returned to baseline and stable Postop Assessment: no apparent nausea or vomiting Anesthetic complications: no   There were no known notable events for this encounter.   Last Vitals:  Vitals:   04/10/23 1150 04/10/23 1318  BP: 106/77 (!) 89/75  Pulse: 66 81  Resp: 12 18  Temp: 36.6 C 36.4 C  SpO2: 97% 94%    Last Pain:  Vitals:   04/10/23 1318  TempSrc: Oral  PainSc: 0-No pain                 Fumiye Lubben L Warda Mcqueary

## 2023-04-11 LAB — SURGICAL PATHOLOGY

## 2023-04-18 ENCOUNTER — Encounter (HOSPITAL_COMMUNITY): Payer: Self-pay | Admitting: Internal Medicine

## 2023-04-25 ENCOUNTER — Encounter: Payer: BC Managed Care – PPO | Admitting: Gastroenterology

## 2023-05-11 ENCOUNTER — Ambulatory Visit (INDEPENDENT_AMBULATORY_CARE_PROVIDER_SITE_OTHER): Payer: BC Managed Care – PPO | Admitting: Gastroenterology

## 2023-05-11 ENCOUNTER — Encounter: Payer: Self-pay | Admitting: Gastroenterology

## 2023-05-11 VITALS — BP 130/85 | HR 67 | Temp 97.4°F | Ht 69.0 in | Wt 307.9 lb

## 2023-05-11 DIAGNOSIS — K641 Second degree hemorrhoids: Secondary | ICD-10-CM

## 2023-05-11 NOTE — Progress Notes (Unsigned)
Gastroenterology Office Note     Primary Care Physician:  Junie Spencer, FNP  Primary Gastroenterologist:   Chief Complaint   Chief Complaint  Patient presents with   post procedure follow up    Follow up after colonoscopy. Still having some bright red rectal bleeding. Thinks coming from hemorrhoids. Not using anything for hemorrhoids. States no irritation or burning. No dizziness, no sob.      History of Present Illness   KAIRI OCASIO is a 62 y.o. 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 Interested in hemorrhoid banding.   Early interval colonoscopy Nov 2024 due to rectal bleeding with bleeding internal hemorrhoids, two 4-6 mm polyps, single solitary ulcer at colonic anastomosis. 3 year surveillance recommended.   [place]    Past Medical History:  Diagnosis Date   CAD (coronary artery disease)    History of kidney stones    Hypercholesterolemia    Hypertension    Myocardial infarction [facility]) 2022    Past Surgical History:  Procedure Laterality Date   BIOPSY  09/08/2022   Procedure: BIOPSY;  Surgeon: [name], DO;  [facility];  Service: Endoscopy;;   BIOPSY  04/10/2023   Procedure: BIOPSY;  Surgeon: [name], DO;  [facility];  Service: Endoscopy;;   CARDIAC CATHETERIZATION  07/2020   with stent.   CATARACT EXTRACTION     CATARACT EXTRACTION W/PHACO Left 01/07/2016   Procedure: CATARACT EXTRACTION PHACO AND INTRAOCULAR LENS PLACEMENT LEFT EYE CDE=13.32;  Surgeon: [name], MD;  Location: [facility];  Service: Ophthalmology;  Laterality: Left;   COLONOSCOPY WITH PROPOFOL N/A 09/08/2022   Procedure: COLONOSCOPY WITH PROPOFOL;  Surgeon: [name], DO;  [facility];  Service: Endoscopy;  Laterality: N/A;  11:30 am   COLONOSCOPY WITH PROPOFOL N/A 04/10/2023   Procedure:  COLONOSCOPY WITH PROPOFOL;  Surgeon: [name], DO;  [facility];  Service: Endoscopy;  Laterality: N/A;  100pm, asa 3   POLYPECTOMY  09/08/2022   Procedure: POLYPECTOMY;  Surgeon: [name], DO;  [facility];  Service: Endoscopy;;   POLYPECTOMY  04/10/2023   Procedure: POLYPECTOMY;  Surgeon: [name], DO;  [facility];  Service: Endoscopy;;   ROBOT ASSISTED LAPAROSCOPIC PARTIAL COLECTOMY  10/07/2022   SMALL INTESTINE SURGERY  1972   SUBMUCOSAL TATTOO INJECTION  09/08/2022   Procedure: SUBMUCOSAL TATTOO INJECTION;  Surgeon: [name], DO;  [facility];  Service: Endoscopy;;    Current Outpatient Medications  Medication Sig Dispense Refill   aspirin 81 MG chewable tablet Chew 81 mg by mouth daily.     atorvastatin (LIPITOR) 80 MG tablet Take 80 mg by mouth at bedtime.     metoprolol succinate (TOPROL-XL) 25 MG 24 hr tablet Take 25 mg by mouth daily.     nitroGLYCERIN (NITROSTAT) 0.4 MG SL tablet Place 0.4 mg under the tongue every 5 (five) minutes as needed for chest pain.     hydrocortisone (ANUSOL-HC) 2.5 % rectal cream Place 1 Application rectally 2 (two) times daily. (Patient not taking: Reported on 05/11/2023) 30 g 1   No current facility-administered medications for this visit.    Allergies as of 05/11/2023 - Review Complete 05/11/2023  Allergen Reaction Noted   Zetia [ezetimibe] Diarrhea and Nausea Only 09/01/2022  Family History  Problem Relation Age of Onset   Diabetes Mother    Stroke Mother    Colon polyps Brother    Colon cancer Neg Hx     Social History   Socioeconomic History   Marital status: Married    Spouse name: Not on file   Number of children: Not on file   Years of education: Not on file   Highest education level: Not on file  Occupational History   Not on file  Tobacco Use   Smoking status: Heavy Smoker    Current packs/day: 1.00    Types: Cigarettes   Smokeless  tobacco: Never  Vaping Use   Vaping status: Never Used  Substance and Sexual Activity   Alcohol use: No   Drug use: No   Sexual activity: Not on file  Other Topics Concern   Not on file  Social History Narrative   Not on file   Social Drivers of Health   Financial Resource Strain: Low Risk  (08/12/2020)   Received from [facility], [facility]   Overall Financial Resource Strain (CARDIA)    Difficulty of Paying Living Expenses: Not hard at all  Food Insecurity: No Food Insecurity (10/14/2022)   Hunger Vital Sign    Worried About Running Out of Food in the Last Year: Never true    Ran Out of Food in the Last Year: Never true  Transportation Needs: No Transportation Needs (10/14/2022)   PRAPARE - Administrator, Civil Service (Medical): No    Lack of Transportation (Non-Medical): No  Physical Activity: Not on file  Stress: No Stress Concern Present (08/12/2020)   Received from [facility], [facility] - Occupational Stress Questionnaire    Feeling of Stress : Not at all  Social Connections: Unknown (09/20/2021)   Received from [facility], [facility]   Social Network    Social Network: Not on file  Intimate Partner Violence: Not At Risk (10/07/2022)   Humiliation, Afraid, Rape, and Kick questionnaire    Fear of Current or Ex-Partner: No    Emotionally Abused: No    Physically Abused: No    Sexually Abused: No     Review of Systems   Gen: Denies any fever, chills, fatigue, weight loss, lack of appetite.  CV: Denies chest pain, heart palpitations, peripheral edema, syncope.  Resp: Denies shortness of breath at rest or with exertion. Denies wheezing or cough.  GI: Denies dysphagia or odynophagia. Denies jaundice, hematemesis, fecal incontinence. GU : Denies urinary burning, urinary frequency, urinary hesitancy MS: Denies joint pain, muscle weakness, cramps, or limitation of movement.  Derm: Denies rash,  itching, dry skin Psych: Denies depression, anxiety, memory loss, and confusion Heme: Denies bruising, bleeding, and enlarged lymph nodes.   Physical Exam   BP 130/85 (BP Location: Right Arm, Patient Position: Sitting)   Pulse 67   Temp (!) 97.4 F (36.3 C) (Oral)   Ht 5\' 9"  (1.753 m)   Wt (!) 307 lb 14.4 oz (139.7 kg)   BMI 45.47 kg/m  General:   Alert and oriented. Pleasant and cooperative. Well-nourished and well-developed.  Head:  Normocephalic and atraumatic. Eyes:  Without icterus Abdomen:  +BS, soft, non-tender and non-distended. No HSM noted. No guarding or rebound. No masses appreciated.  Rectal:  Deferred  Msk:  Symmetrical without gross deformities. Normal posture. Extremities:  Without edema. Neurologic:  Alert and  oriented x4;  grossly normal neurologically. Skin:  Intact without significant lesions or rashes. Psych:  Alert and cooperative. Normal mood and affect.   Assessment   PRAMOD HARDIMAN is a 62 y.o. male presenting today with a history of   Left lateral   PLAN   *****    Gelene Mink, PhD, ANP-BC [facility] Gastroenterology

## 2023-05-11 NOTE — Patient Instructions (Signed)

## 2023-05-30 ENCOUNTER — Encounter: Payer: Self-pay | Admitting: Gastroenterology

## 2023-05-30 ENCOUNTER — Ambulatory Visit: Payer: BC Managed Care – PPO | Admitting: Gastroenterology

## 2023-05-30 VITALS — BP 128/85 | HR 64 | Temp 97.5°F | Ht 69.0 in | Wt 310.8 lb

## 2023-05-30 DIAGNOSIS — K641 Second degree hemorrhoids: Secondary | ICD-10-CM

## 2023-05-30 NOTE — Patient Instructions (Signed)

## 2023-05-30 NOTE — Progress Notes (Signed)
       [facility] BANDING PROCEDURE NOTE  Vincent Griffin is a 63 y.o. male presenting today for consideration of hemorrhoid banding.  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 Early interval colonoscopy Nov 2024 due to rectal bleeding with bleeding internal hemorrhoids, two 4-6 mm polyps, single solitary ulcer at colonic anastomosis. 3 year surveillance recommended. Bleeding daily prior to first banding. He has had left lateral banding. Bleeding improved significantly.    The patient presents with symptomatic grade 2 hemorrhoids, unresponsive to maximal medical therapy, requesting rubber band ligation of his hemorrhoidal disease. All risks, benefits, and alternative forms of therapy were described and informed consent was obtained.   The decision was made to band the right posterior internal hemorrhoid, and the [facility] O'Regan System was used to perform band ligation without complication. Digital anorectal examination was then performed to assure proper positioning of the band, and to adjust the banded tissue as required. The patient was discharged home without pain or other issues. Dietary and behavioral recommendations were given, along with follow-up instructions. The patient will return in several weeks for followup and possible additional banding as required.  No complications were encountered and the patient tolerated the procedure well.   Therisa MICAEL Stager, PhD, ANP-BC [facility] Gastroenterology

## 2023-05-31 ENCOUNTER — Other Ambulatory Visit: Payer: Self-pay

## 2023-05-31 DIAGNOSIS — C180 Malignant neoplasm of cecum: Secondary | ICD-10-CM

## 2023-06-01 ENCOUNTER — Ambulatory Visit (HOSPITAL_COMMUNITY)
Admission: RE | Admit: 2023-06-01 | Discharge: 2023-06-01 | Disposition: A | Discharge status: Home / Self Care | Payer: BC Managed Care – PPO | Source: Ambulatory Visit | Attending: Hematology | Admitting: Hematology

## 2023-06-01 ENCOUNTER — Inpatient Hospital Stay: Payer: BC Managed Care – PPO | Attending: Hematology

## 2023-06-01 ENCOUNTER — Other Ambulatory Visit: Payer: BC Managed Care – PPO

## 2023-06-01 DIAGNOSIS — E611 Iron deficiency: Secondary | ICD-10-CM | POA: Insufficient documentation

## 2023-06-01 DIAGNOSIS — J432 Centrilobular emphysema: Secondary | ICD-10-CM | POA: Diagnosis not present

## 2023-06-01 DIAGNOSIS — R918 Other nonspecific abnormal finding of lung field: Secondary | ICD-10-CM | POA: Insufficient documentation

## 2023-06-01 DIAGNOSIS — I700 Atherosclerosis of aorta: Secondary | ICD-10-CM | POA: Diagnosis not present

## 2023-06-01 DIAGNOSIS — F17210 Nicotine dependence, cigarettes, uncomplicated: Secondary | ICD-10-CM | POA: Insufficient documentation

## 2023-06-01 DIAGNOSIS — C180 Malignant neoplasm of cecum: Secondary | ICD-10-CM | POA: Diagnosis not present

## 2023-06-01 DIAGNOSIS — C189 Malignant neoplasm of colon, unspecified: Secondary | ICD-10-CM | POA: Diagnosis not present

## 2023-06-01 LAB — CBC WITH DIFFERENTIAL/PLATELET
Abs Immature Granulocytes: 0.02 10*3/uL (ref 0.00–0.07)
Basophils Absolute: 0.1 10*3/uL (ref 0.0–0.1)
Basophils Relative: 1 %
Eosinophils Absolute: 0.2 10*3/uL (ref 0.0–0.5)
Eosinophils Relative: 4 %
HCT: 47.3 % (ref 39.0–52.0)
Hemoglobin: 15.5 g/dL (ref 13.0–17.0)
Immature Granulocytes: 0 %
Lymphocytes Relative: 22 %
Lymphs Abs: 1.5 10*3/uL (ref 0.7–4.0)
MCH: 33.6 pg (ref 26.0–34.0)
MCHC: 32.8 g/dL (ref 30.0–36.0)
MCV: 102.6 fL — ABNORMAL HIGH (ref 80.0–100.0)
Monocytes Absolute: 0.5 10*3/uL (ref 0.1–1.0)
Monocytes Relative: 8 %
Neutro Abs: 4.5 10*3/uL (ref 1.7–7.7)
Neutrophils Relative %: 65 %
Platelets: 203 10*3/uL (ref 150–400)
RBC: 4.61 MIL/uL (ref 4.22–5.81)
RDW: 13.9 % (ref 11.5–15.5)
WBC: 6.8 10*3/uL (ref 4.0–10.5)
nRBC: 0 % (ref 0.0–0.2)

## 2023-06-01 LAB — COMPREHENSIVE METABOLIC PANEL
ALT: 26 U/L (ref 0–44)
AST: 24 U/L (ref 15–41)
Albumin: 3.7 g/dL (ref 3.5–5.0)
Alkaline Phosphatase: 82 U/L (ref 38–126)
Anion gap: 6 (ref 5–15)
BUN: 10 mg/dL (ref 6–20)
CO2: 30 mmol/L (ref 22–32)
Calcium: 8.8 mg/dL — ABNORMAL LOW (ref 8.9–10.3)
Chloride: 102 mmol/L (ref 98–111)
Creatinine, Ser: 1.07 mg/dL (ref 0.61–1.24)
GFR, Estimated: >60 mL/min (ref >60)
Glucose, Bld: 94 mg/dL (ref 70–99)
Potassium: 4.7 mmol/L (ref 3.5–5.1)
Sodium: 138 mmol/L (ref 135–145)
Total Bilirubin: 0.6 mg/dL (ref 0.0–1.2)
Total Protein: 7.3 g/dL (ref 6.5–8.1)

## 2023-06-01 LAB — IRON AND TIBC
Iron: 137 ug/dL (ref 45–182)
Saturation Ratios: 37 % (ref 17.9–39.5)
TIBC: 371 ug/dL (ref 250–450)
UIBC: 234 ug/dL

## 2023-06-01 LAB — FERRITIN: Ferritin: 43 ng/mL (ref 24–336)

## 2023-06-01 MED ORDER — IOHEXOL 300 MG/ML  SOLN
100.0000 mL | Freq: Once | INTRAMUSCULAR | Status: AC | PRN
  Administered 2023-06-01: 100 mL via INTRAVENOUS

## 2023-06-01 MED ORDER — IOHEXOL 300 MG/ML  SOLN
30.0000 mL | Freq: Once | INTRAMUSCULAR | Status: AC | PRN
  Administered 2023-06-01: 30 mL via ORAL

## 2023-06-02 LAB — CEA: CEA: 1.8 ng/mL (ref 0.0–4.7)

## 2023-06-08 ENCOUNTER — Inpatient Hospital Stay: Payer: BC Managed Care – PPO | Admitting: Hematology

## 2023-06-08 VITALS — BP 125/85 | HR 64 | Temp 97.0°F | Resp 18 | Ht 70.0 in | Wt 300.0 lb

## 2023-06-08 DIAGNOSIS — F17210 Nicotine dependence, cigarettes, uncomplicated: Secondary | ICD-10-CM | POA: Diagnosis not present

## 2023-06-08 DIAGNOSIS — C180 Malignant neoplasm of cecum: Secondary | ICD-10-CM

## 2023-06-08 DIAGNOSIS — R918 Other nonspecific abnormal finding of lung field: Secondary | ICD-10-CM | POA: Diagnosis not present

## 2023-06-08 DIAGNOSIS — E611 Iron deficiency: Secondary | ICD-10-CM | POA: Diagnosis not present

## 2023-06-08 NOTE — Patient Instructions (Signed)
[facility] at [facility] Discharge Instructions   You were seen and examined today by Dr. Ellin Saba.  He reviewed the results of your lab work which are normal/stable.   He reviewed the results of your CT scan which was normal/stable. No evidence of cancer was seen on this exam.   We will see you back in 3 months. We will repeat lab work prior to this visit.   Return as scheduled.    Thank you for choosing [facility] at [facility] to provide your oncology and hematology care.  To afford each patient quality time with our provider, please arrive at least 15 minutes before your scheduled appointment time.   If you have a lab appointment with the Cancer Center please come in thru the Main Entrance and check in at the main information desk.  You need to re-schedule your appointment should you arrive 10 or more minutes late.  We strive to give you quality time with our providers, and arriving late affects you and other patients whose appointments are after yours.  Also, if you no show three or more times for appointments you may be dismissed from the clinic at the providers discretion.     Again, thank you for choosing [facility].  Our hope is that these requests will decrease the amount of time that you wait before being seen by our physicians.       _____________________________________________________________  Should you have questions after your visit to [facility], please contact our office at [phone] and follow the prompts.  Our office hours are 8:00 a.m. and 4:30 p.m. Monday - Friday.  Please note that voicemails left after 4:00 p.m. may not be returned until the following business day.  We are closed weekends and major holidays.  You do have access to a nurse 24-7, just call the main number to the clinic [phone] and do not press any options, hold on the line and a nurse will answer the phone.    For  prescription refill requests, have your pharmacy contact our office and allow 72 hours.    Due to Covid, you will need to wear a mask upon entering the hospital. If you do not have a mask, a mask will be given to you at the Main Entrance upon arrival. For doctor visits, patients may have 1 support person age 24 or older with them. For treatment visits, patients can not have anyone with them due to social distancing guidelines and our immunocompromised population.

## 2023-06-08 NOTE — Progress Notes (Signed)
[facility] [address], [place] [address]    Clinic Day:  06/15/2023  Referring physician: Junie Spencer, FNP  Patient Care Team: Junie Spencer, FNP as PCP - General (Family Medicine) Jena Gauss Gerrit Friends, MD as Consulting Physician (Gastroenterology)   ASSESSMENT & PLAN:   Assessment: 1.  Stage II (T3 N0) cecal adenocarcinoma: - Colonoscopy (09/08/2022): Cecal mass - CT CAP (09/13/2022): Large cecal mass, small pericolonic and ileocolic nodes.  Multiple bilateral pulmonary nodules measuring up to 7 mm. - CEA (10/05/2022): 42.1 - Robotic assisted laparoscopic right hemicolectomy on 10/07/2022 by Dr. Lovell Sheehan. - Pathology: Invasive moderately differentiated adenocarcinoma, margins negative, 0/18 lymph nodes involved, pT3 pN0, MMR preserved.   2.  Social/family history: - Lives at home with his wife and is independent of ADLs and IADLs.  He is now working at a [employer].  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  Current active smoker, 1 pack/day, started at age 5. - 2-3 maternal aunts had cancer.  He does not know the type.    Plan: 1.  Stage II (T3 N0 G2) cecal adenocarcinoma: - He denies any change in bowel habits.  Denies any bleeding per rectum or melena. - Reviewed labs from 06/01/2023: Normal LFTs and CBC.  CEA is 1.8.  Signatera test is negative. - CT CAP (05/31/2020): No evidence of recurrence in the chest, abdomen or pelvis.  Multiple small lung nodules unchanged.  Other benign findings including left adrenal adenoma, nephrolithiasis were discussed. - Recommend follow-up in 3 months with repeat labs and CEA.  2.  Iron deficiency state: - Ferritin is 43.  He is currently taking iron 3 times a week.  He may change it to once a week.     Orders Placed This Encounter  Procedures   CBC with Differential    Standing Status:   Future    Expected Date:   08/30/2023    Expiration Date:   06/07/2024   Comprehensive metabolic  panel    Standing Status:   Future    Expected Date:   08/30/2023    Expiration Date:   06/07/2024   CEA    Standing Status:   Future    Expected Date:   08/30/2023    Expiration Date:   06/07/2024   Signatera    Select as applicable. If patient is on or planning to receive immunotherapy, select drug: Not on Immunotherapy If "Other or Multiple", Write down drug name:  Do not Delete Below This Line   ==========Department Information========== ID: [id number] Department:[facility] - A DEPT OF Eligha Bridegroom [facility] [address] [place] [address] Dept: [phone] Dept Fax: [phone]    Standing Status:   Standing    Number of Occurrences:   6    Expiration Date:   06/07/2024    Avelina Laine to follow up with patient for sample collection (mobile phleb, lab, or saliva)::   No    Surveillance Program draw frequency::   Every 3 Months    Surveillance Program draw count::   4    Cancer type::   Colon    Stage of diagnosis::   II    History of recurrence?:   No    Current disease status::   No evidence of disease    By placing this electronic order I confirm the testing ordered herein is medically necessary and this patient has been  informed of the details of the genetic test(s) ordered, including the risks, benefits, and alternatives, and has consented to testing.:   Yes    What type of billing?:   [employer] R Teague,acting as a Neurosurgeon for [employer], MD.,have documented all relevant documentation on the behalf of Vincent Massed, MD,as directed by  Vincent Massed, MD while in the presence of Vincent Massed, MD.  I, Vincent Massed MD, have reviewed the above documentation for accuracy and completeness, and I agree with the above.    Vincent Massed, MD   1/30/20259:51 AM  CHIEF COMPLAINT:   Diagnosis: cecal cancer    Cancer Staging  Cecal cancer [facility]) Staging form:  Colon and Rectum, AJCC 8th Edition - Clinical stage from 11/02/2022: Stage IIA (cT3, cN0, cM0) - Unsigned    Prior Therapy: right hemicolectomy, 10/07/22   Current Therapy: Surveillance   HISTORY OF PRESENT ILLNESS:   Oncology History   No history exists.     INTERVAL HISTORY:   Vincent Griffin is a 63 y.o. male presenting to clinic today for follow up of cecal cancer. He was last seen by me on 03/08/23.  Since his last visit, he underwent a colonoscopy on 04/10/23 under Dr. Marletta Lor. Pathology of the anastomotic ulcer did not show any dysplasia or malignancy.   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 His appetite level is at 100%. His energy level is at 25%. He reports diarrhea after drinking contrast for CT. He does not take oral iron. He recently underwent a breathing test and has a pulmonary function test coming up.   PAST MEDICAL HISTORY:   Past Medical History: Past Medical History:  Diagnosis Date   CAD (coronary artery disease)    History of kidney stones    Hypercholesterolemia    Hypertension    Myocardial infarction [facility]) 2022    Surgical History: Past Surgical History:  Procedure Laterality Date   BIOPSY  09/08/2022   Procedure: BIOPSY;  Surgeon: [name], DO;  [facility];  Service: Endoscopy;;   BIOPSY  04/10/2023   Procedure: BIOPSY;  Surgeon: [name], DO;   [facility];  Service: Endoscopy;;   CARDIAC CATHETERIZATION  07/2020   with stent.   CATARACT EXTRACTION     CATARACT EXTRACTION W/PHACO Left 01/07/2016   Procedure: CATARACT EXTRACTION PHACO AND INTRAOCULAR LENS PLACEMENT LEFT EYE CDE=13.32;  Surgeon: [name], MD;  Location: [facility];  Service: Ophthalmology;  Laterality: Left;   COLONOSCOPY WITH PROPOFOL N/A 09/08/2022   Procedure: COLONOSCOPY WITH PROPOFOL;  Surgeon: [name], DO;  Location[facility];  Service: Endoscopy;  Laterality: N/A;  11:30 am   COLONOSCOPY WITH PROPOFOL N/A 04/10/2023   Procedure: COLONOSCOPY WITH PROPOFOL;  Surgeon: [name], DO;  [facility];  Service: Endoscopy;  Laterality: N/A;  100pm, asa 3   POLYPECTOMY  09/08/2022   Procedure: POLYPECTOMY;  Surgeon: [name], DO;  Location[facility];  Service: Endoscopy;;   POLYPECTOMY  04/10/2023   Procedure: POLYPECTOMY;  Surgeon: [name], DO;  [facility];  Service: Endoscopy;;   ROBOT ASSISTED LAPAROSCOPIC PARTIAL COLECTOMY  10/07/2022   SMALL INTESTINE SURGERY  1972   SUBMUCOSAL TATTOO INJECTION  09/08/2022   Procedure: SUBMUCOSAL TATTOO INJECTION;  Surgeon: [name], DO;  [facility];  Service: Endoscopy;;    Social History: Social History   Socioeconomic History   Marital status: Married    Spouse name: Not on file   Number of children: Not on file   Years of education: Not on file   Highest education level: Not on file  Occupational History   Not on file  Tobacco Use   Smoking status: Heavy Smoker    Current packs/day: 1.00    Types: Cigarettes   Smokeless tobacco: Never  Vaping Use   Vaping status: Never Used  Substance and Sexual Activity   Alcohol use: No   Drug use: No   Sexual activity: Not on file  Other Topics Concern   Not on file  Social History Narrative   Not on file   Social Drivers of Health   Financial Resource Strain: Low  Risk  (08/12/2020)   Received from [facility]   Overall Financial Resource Strain (CARDIA)    Difficulty of Paying Living Expenses: Not hard at all  Food Insecurity: No Food Insecurity (10/14/2022)   Hunger Vital Sign    Worried About Running Out of Food in the Last Year: Never true    Ran Out of Food in the Last Year: Never true  Transportation Needs: No Transportation Needs (10/14/2022)   PRAPARE - Administrator, Civil Service (Medical): No    Lack of Transportation (Non-Medical): No  Physical Activity: Not on file  Stress: No Stress Concern Present (08/12/2020)   Received from [facility], [facility] - Occupational Stress Questionnaire    Feeling of Stress : Not at all  Social Connections: Unknown (09/20/2021)   Received from [facility], [facility]   Social Network    Social Network: Not on file  Intimate Partner Violence: Not At Risk (10/07/2022)   Humiliation, Afraid, Rape, and Kick questionnaire    Fear of Current or Ex-Partner: No    Emotionally Abused: No    Physically Abused: No    Sexually Abused: No    Family History: Family History  Problem Relation Age of Onset   Diabetes Mother    Stroke Mother    Colon polyps Brother    Colon cancer Neg Hx     Current Medications:  Current Outpatient Medications:    aspirin 81 MG chewable tablet, Chew 81 mg by mouth daily., Disp: , Rfl:    atorvastatin (LIPITOR) 80 MG tablet, Take 80 mg by mouth at bedtime., Disp: , Rfl:    hydrocortisone (ANUSOL-HC) 2.5 % rectal cream, Place 1 Application rectally 2 (two) times daily., Disp: 30 g, Rfl: 1   metoprolol succinate (TOPROL-XL) 25 MG 24 hr tablet, Take 25 mg by mouth daily., Disp: , Rfl:    nitroGLYCERIN (NITROSTAT) 0.4 MG SL tablet, Place 0.4 mg under the tongue every 5 (five) minutes as needed for chest pain., Disp: , Rfl:    Allergies: Allergies  Allergen Reactions   Zetia [Ezetimibe] Diarrhea  and Nausea Only    Upset stomach    REVIEW OF SYSTEMS:  Review of Systems  Constitutional:  Negative for chills, fatigue and fever.  HENT:   Negative for lump/mass, mouth sores, nosebleeds, sore throat and trouble swallowing.   Eyes:  Negative for eye problems.  Respiratory:  Positive for cough and shortness of breath.   Cardiovascular:  Negative for chest pain, leg swelling and palpitations.  Gastrointestinal:  Positive for diarrhea. Negative for abdominal pain, constipation, nausea and vomiting.  Genitourinary:  Negative for bladder incontinence, difficulty urinating, dysuria, frequency, hematuria and nocturia.   Musculoskeletal:  Negative for arthralgias, back pain, flank pain, myalgias and neck pain.  Skin:  Negative for itching and rash.  Neurological:  Negative for dizziness, headaches and numbness.       +tingling hands  Hematological:  Does not bruise/bleed easily.  Psychiatric/Behavioral:  Negative for depression, sleep disturbance and suicidal ideas. The patient is not nervous/anxious.   All other systems reviewed and are negative.    VITALS:   Blood pressure 125/85, pulse 64, temperature (!) 97 F (36.1 C), temperature source Tympanic, resp. rate 18, height 5\' 10"  (1.778 m), weight 300 lb (136.1 kg), SpO2 94%.  Wt Readings from Last 3 Encounters:  06/08/23 300 lb (136.1 kg)  05/30/23 (!) 310 lb 12.8 oz (141 kg)  05/11/23 (!) 307 lb 14.4 oz (139.7 kg)    Body mass index is 43.05 kg/m.  Performance status (ECOG): 1 - Symptomatic but completely ambulatory  PHYSICAL EXAM:   Physical Exam Vitals and nursing note reviewed. Exam conducted with a chaperone present.  Constitutional:      Appearance: Normal appearance.  Cardiovascular:     Rate and Rhythm: Normal rate and regular rhythm.     Pulses: Normal pulses.     Heart sounds: Normal heart sounds.  Pulmonary:     Effort: Pulmonary effort is normal.     Breath sounds: Normal breath sounds.  Abdominal:      Palpations: Abdomen is soft. There is no hepatomegaly, splenomegaly or mass.     Tenderness: There is no abdominal tenderness.  Musculoskeletal:     Right lower leg: No edema.     Left lower leg: No edema.  Lymphadenopathy:     Cervical: No cervical adenopathy.     Right cervical: No superficial, deep or posterior cervical adenopathy.    Left cervical: No superficial, deep or posterior cervical adenopathy.     Upper Body:     Right upper body: No supraclavicular or axillary adenopathy.     Left upper body: No supraclavicular or axillary adenopathy.  Neurological:     General: No focal deficit present.     Mental Status: He is alert and oriented to person, place, and time.  Psychiatric:        Mood and Affect: Mood normal.        Behavior: Behavior normal.     LABS:      Latest Ref Rng & Units 06/01/2023   12:05 PM 04/05/2023   10:18 AM 03/01/2023    9:42 AM  CBC  WBC 4.0 - 10.5 K/uL 6.8  6.8  6.2   Hemoglobin 13.0 - 17.0 g/dL 17.4  94.4  96.7   Hematocrit 39.0 - 52.0 % 47.3  44.4  45.9   Platelets 150 - 400 K/uL 203  229  209       Latest Ref Rng & Units 06/01/2023   12:05 PM 03/01/2023    9:42 AM 11/03/2022    1:41 PM  CMP  Glucose 70 - 99 mg/dL  94  93  92   BUN 6 - 20 mg/dL 10  16  10    Creatinine 0.61 - 1.24 mg/dL 1.61  0.96  0.45   Sodium 135 - 145 mmol/L 138  137  136   Potassium 3.5 - 5.1 mmol/L 4.7  4.4  4.6   Chloride 98 - 111 mmol/L 102  101  102   CO2 22 - 32 mmol/L 30  30  26    Calcium 8.9 - 10.3 mg/dL 8.8  8.8  8.9   Total Protein 6.5 - 8.1 g/dL 7.3  7.2  7.1   Total Bilirubin 0.0 - 1.2 mg/dL 0.6  0.8  0.5   Alkaline Phos 38 - 126 U/L 82  84  101   AST 15 - 41 U/L 24  25  28    ALT 0 - 44 U/L 26  24  30       Lab Results  Component Value Date   CEA1 1.8 06/01/2023   /  CEA  Date Value Ref Range Status  06/01/2023 1.8 0.0 - 4.7 ng/mL Final    Comment:    (NOTE)                             Nonsmokers          <3.9                              Smokers             <5.6 Roche [employer]ostics Electrochemiluminescence Immunoassay (ECLIA) Values obtained with different assay methods or kits cannot be used interchangeably.  Results cannot be interpreted as absolute evidence of the presence or absence of malignant disease. Performed At: [facility] [address] [place], [place] [id number] Jolene Schimke MD NW:[id number]    Lab Results  Component Value Date   PSA1 0.5 06/05/2020   No results found for: "VHQ469" No results found for: "CAN125"  No results found for: "TOTALPROTELP", "ALBUMINELP", "A1GS", "A2GS", "BETS", "BETA2SER", "GAMS", "MSPIKE", "SPEI" Lab Results  Component Value Date   TIBC 371 06/01/2023   TIBC 352 11/03/2022   FERRITIN 43 06/01/2023   FERRITIN 50 11/03/2022   IRONPCTSAT 37 06/01/2023   IRONPCTSAT 32 11/03/2022   No results found for: "LDH"   STUDIES:   CT CHEST ABDOMEN PELVIS W CONTRAST Result Date: 06/01/2023 CLINICAL DATA:  Colon cancer restaging, status post right hemicolectomy * Tracking Code: BO * EXAM: CT CHEST, ABDOMEN, AND PELVIS WITH CONTRAST TECHNIQUE: Multidetector CT imaging of the chest, abdomen and pelvis was performed following the standard protocol during bolus administration of intravenous contrast. RADIATION DOSE REDUCTION: This exam was performed according to the departmental dose-optimization program which includes automated exposure control, adjustment of the mA and/or kV according to patient size and/or use of iterative reconstruction technique. CONTRAST:  OMNIPAQUE IOHEXOL 300 MG/ML SOLN, 30mL OMNIPAQUE IOHEXOL 300 MG/ML SOLN, additional oral enteric contrast COMPARISON:  PET-CT, 11/24/2022 FINDINGS: CT CHEST FINDINGS Cardiovascular: Aortic atherosclerosis. Normal heart size. Left and right coronary artery calcifications. No pericardial effusion. Mediastinum/Nodes: No enlarged mediastinal, hilar, or axillary lymph nodes. Thyroid gland, trachea, and esophagus demonstrate no  significant findings. Lungs/Pleura: Moderate centrilobular and paraseptal emphysema. Diffuse bilateral bronchial wall thickening. Multiple small bilateral pulmonary nodules, unchanged, for example a 0.5 cm nodule of the peripheral right lower lobe (series 3, image 112) and a 0.4 cm nodule of  the peripheral left lower lobe (series 3, image 104). No pleural effusion or pneumothorax. Musculoskeletal: No chest wall abnormality. No acute osseous findings. CT ABDOMEN PELVIS FINDINGS Hepatobiliary: No solid liver abnormality is seen. Hepatic steatosis. No gallstones, gallbladder wall thickening, or biliary dilatation. Pancreas: Unremarkable. No pancreatic ductal dilatation or surrounding inflammatory changes. Spleen: Normal in size without significant abnormality. Adrenals/Urinary Tract: Unchanged soft tissue attenuation left adrenal nodule measuring 1.5 x 1.5 cm (series 2, image 63). Small bilateral nonobstructive renal calculi. No ureteral calculi or hydronephrosis. Bladder is unremarkable. Stomach/Bowel: Stomach is within normal limits. Status post partial right hemicolectomy and ileocolic anastomosis. No evidence of bowel wall thickening, distention, or inflammatory changes. Sigmoid diverticulosis. Vascular/Lymphatic: Aortic atherosclerosis. 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 No enlarged abdominal or pelvic lymph nodes. Reproductive: No mass or other abnormality. Other: No abdominal wall hernia or abnormality. No ascites. Musculoskeletal: No acute osseous findings. IMPRESSION: 1. Status post partial right hemicolectomy and ileocolic anastomosis. 2. No evidence of recurrent or metastatic disease in the chest, abdomen, or pelvis. 3. Multiple small bilateral pulmonary nodules, unchanged, nonspecific and most likely benign sequelae of infection or inflammation. Continued attention on follow-up. 4. Unchanged soft tissue attenuation left adrenal  nodule, which remains most likely a benign incidental adenoma. Attention on follow-up. 5. Nonobstructive bilateral nephrolithiasis. 6. Emphysema and diffuse bilateral bronchial wall thickening. 7. Coronary artery disease. 8. 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 Recommend referral to or continued care with vascular specialist if clinically appropriate. (Ref.: J Vasc Surg. 2018; 67:2-77 and J Am Coll Radiol 2013;10(10):789-794.) Aortic Atherosclerosis (ICD10-I70.0) and Emphysema (ICD10-J43.9). Electronically Signed   By: Jearld Lesch M.D.   On: 06/01/2023 16:56

## 2023-06-14 LAB — SIGNATERA
SIGNATERA MTM READOUT: 0 MTM/ml
SIGNATERA TEST RESULT: NEGATIVE

## 2023-06-15 ENCOUNTER — Encounter: Payer: Self-pay | Admitting: Gastroenterology

## 2023-06-15 ENCOUNTER — Ambulatory Visit: Payer: BC Managed Care – PPO | Admitting: Gastroenterology

## 2023-06-15 VITALS — BP 136/89 | HR 69 | Temp 97.7°F | Ht 70.0 in | Wt 306.2 lb

## 2023-06-15 DIAGNOSIS — K641 Second degree hemorrhoids: Secondary | ICD-10-CM | POA: Diagnosis not present

## 2023-06-15 NOTE — Patient Instructions (Signed)
  Please avoid straining.  You should limit your toilet time to 2-3 minutes at the most.   I recommend Benefiber 2 teaspoons each morning in the beverage of your choice!  Please call me with any concerns or issues!  Your next colonoscopy is in 2027! We can see you as needed!  I enjoyed seeing you again today! I value our relationship and want to provide genuine, compassionate, and quality care. You may receive a survey regarding your visit with me, and I welcome your feedback! Thanks so much for taking the time to complete this. I look forward to seeing you again.      Gelene Mink, PhD, ANP-BC [facility]

## 2023-06-15 NOTE — Progress Notes (Cosign Needed)
      [facility] BANDING PROCEDURE NOTE  Vincent Griffin is a 63 y.o. male presenting today for consideration of hemorrhoid banding. 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 Early interval colonoscopy Nov 2024 due to rectal bleeding with bleeding internal hemorrhoids, two 4-6 mm polyps, single solitary ulcer at colonic anastomosis. 3 year surveillance recommended. Bleeding daily prior to first banding. He has had left lateral banding and right posterior. Noted significant improvement in symptoms overall.    The patient presents with symptomatic grade 2 hemorrhoids, unresponsive to maximal medical therapy, requesting rubber band ligation of his hemorrhoidal disease. All risks, benefits, and alternative forms of therapy were described and informed consent was obtained.   The decision was made to band the right anterior internal hemorrhoid, and the [facility] System was used to perform band ligation without complication. Digital anorectal examination was then performed to assure proper positioning of the band, and to adjust the banded tissue as required. The patient was discharged home without pain or other issues. Dietary and behavioral recommendations were given, along with follow-up instructions. The patient will return as needed. He is on recall for colonoscopy in 2027.   No complications were encountered and the patient tolerated the procedure well.   Gelene Mink, PhD, ANP-BC [facility]

## 2023-07-03 ENCOUNTER — Telehealth: Payer: Self-pay

## 2023-07-03 NOTE — Telephone Encounter (Signed)
 Returned the pt's call and LMOVM for the pt to return call ?

## 2023-07-11 ENCOUNTER — Telehealth: Payer: Self-pay | Admitting: Family

## 2023-07-11 NOTE — Telephone Encounter (Signed)
 LMTCB, Vincent Griffin has appt available before 08/03/23 Copied from CRM #[id number]. Topic: Appointments - Scheduling Inquiry for Clinic >> Jul 11, 2023  3:21 PM Vincent Griffin wrote: Reason for CRM: Patient called and scheduled an appointment for a physical on March 20th- he has an evaluation form that he needs filled out for work (it is not [employer]) - I placed the patient on the waitlist - if there's anything else that we can do for the patient to get them seen sooner, he would greatly appreciate it.   Contact Center Agent noted that he hasn't been seen in over a year (it looks like his care is managed by a number of specialists - they declined to fill out the paperwork and advised him to reach out and make an appointment with PCP).

## 2023-07-18 ENCOUNTER — Encounter: Payer: Self-pay | Admitting: Family

## 2023-07-18 ENCOUNTER — Ambulatory Visit: Payer: BC Managed Care – PPO | Admitting: Family

## 2023-07-18 VITALS — BP 123/76 | HR 83 | Temp 98.9°F | Ht 70.0 in | Wt 307.0 lb

## 2023-07-18 DIAGNOSIS — I2585 Chronic coronary microvascular dysfunction: Secondary | ICD-10-CM | POA: Diagnosis not present

## 2023-07-18 DIAGNOSIS — I252 Old myocardial infarction: Secondary | ICD-10-CM

## 2023-07-18 DIAGNOSIS — R5383 Other fatigue: Secondary | ICD-10-CM

## 2023-07-18 DIAGNOSIS — Z23 Encounter for immunization: Secondary | ICD-10-CM

## 2023-07-18 DIAGNOSIS — E785 Hyperlipidemia, unspecified: Secondary | ICD-10-CM | POA: Diagnosis not present

## 2023-07-18 DIAGNOSIS — Z0001 Encounter for general adult medical examination with abnormal findings: Secondary | ICD-10-CM | POA: Diagnosis not present

## 2023-07-18 DIAGNOSIS — Z0000 Encounter for general adult medical examination without abnormal findings: Secondary | ICD-10-CM

## 2023-07-18 DIAGNOSIS — F17200 Nicotine dependence, unspecified, uncomplicated: Secondary | ICD-10-CM

## 2023-07-18 DIAGNOSIS — R6889 Other general symptoms and signs: Secondary | ICD-10-CM | POA: Diagnosis not present

## 2023-07-18 DIAGNOSIS — I739 Peripheral vascular disease, unspecified: Secondary | ICD-10-CM

## 2023-07-18 DIAGNOSIS — J439 Emphysema, unspecified: Secondary | ICD-10-CM

## 2023-07-18 DIAGNOSIS — I723 Aneurysm of iliac artery: Secondary | ICD-10-CM

## 2023-07-18 DIAGNOSIS — J449 Chronic obstructive pulmonary disease, unspecified: Secondary | ICD-10-CM | POA: Insufficient documentation

## 2023-07-18 MED ORDER — BREZTRI AEROSPHERE 160-9-4.8 MCG/ACT IN AERO: 2.0000 | INHALATION_SPRAY | Freq: Two times a day (BID) | RESPIRATORY_TRACT | 11 refills | Status: DC

## 2023-07-18 NOTE — Patient Instructions (Signed)

## 2023-07-18 NOTE — Progress Notes (Signed)
 Subjective:    Patient ID: Vincent Griffin, male    DOB: 1960/12/23, 63 y.o.   MRN: [id number]  Chief Complaint  Patient presents with   Annual Exam   Pt presents to the office today for CPE.   He had a NSTEMI in 08/12/20 and is followed by Cardiologists every 6 months.  He has hx colon cancer and had surgery to remove it. Doing well.   He is a current smoker of a pack a day for 40 years.  He has COPD and has intermittent SOB.   He is morbid obese.   Complaining of fatigue and has to take a nap when he gets home.   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 Recommend referral to or continued care with vascular specialist if clinically appropriate." Nicotine Dependence Presents for follow-up visit. The symptoms have been stable. He smokes 1 pack of cigarettes per day. Compliance with prior treatments has been good.  Hyperlipidemia This is a chronic problem. The current episode started more than 1 year ago. Associated symptoms include shortness of breath. Current antihyperlipidemic treatment includes statins. The current treatment provides mild improvement of lipids. Risk factors for coronary artery disease include dyslipidemia, hypertension, male sex and a sedentary lifestyle.  Hypertension This is a chronic problem. The current episode started more than 1 year ago. The problem has been resolved since onset. The problem is controlled. Associated symptoms include malaise/fatigue, peripheral edema and shortness of breath. Pertinent negatives include no headaches. Risk factors for coronary artery disease include dyslipidemia, obesity, male gender, sedentary lifestyle and smoking/tobacco exposure. The current treatment provides moderate improvement.      Review of Systems  Constitutional:  Positive for malaise/fatigue.  Respiratory:  Positive for shortness of breath.   Neurological:  Negative for headaches.   All other systems reviewed and are negative.  Family History  Problem Relation Age of Onset   Diabetes Mother    Stroke Mother    Colon polyps Brother    Colon cancer Neg Hx    Social History   Socioeconomic History   Marital status: Married    Spouse name: Not on file   Number of children: Not on file   Years of education: Not on file   Highest education level: Not on file  Occupational History   Not on file  Tobacco Use   Smoking status: Heavy Smoker    Current packs/day: 1.00    Types: Cigarettes   Smokeless tobacco: Never  Vaping Use   Vaping status: Never Used  Substance and Sexual Activity   Alcohol use: No   Drug use: No   Sexual activity: Not on file  Other Topics Concern   Not on file  Social History Narrative   Not on file   Social Drivers of Health   Financial Resource Strain: Low Risk  (08/12/2020)   Received from [facility], [facility]   Overall Financial Resource Strain (CARDIA)    Difficulty of Paying Living Expenses: Not hard at all  Food Insecurity: No Food Insecurity (10/14/2022)   Hunger Vital Sign    Worried About Running Out of Food in the Last Year: Never true    Ran Out of Food in the Last Year: Never true  Transportation Needs: No Transportation Needs (10/14/2022)   PRAPARE - Administrator, Civil Service (Medical): No    Lack of Transportation (  Non-Medical): No  Physical Activity: Not on file  Stress: No Stress Concern Present (08/12/2020)   Received from [facility], [facility] of Occupational Health - Occupational Stress Questionnaire    Feeling of Stress : Not at all  Social Connections: Unknown (09/20/2021)   Received from [facility], [facility]   Social Network    Social Network: Not on file       Objective:   Physical Exam Vitals reviewed.  Constitutional:      General: He is not in acute distress.    Appearance: He is well-developed. He is obese.  HENT:     Head:  Normocephalic.     Right Ear: Tympanic membrane normal.     Left Ear: Tympanic membrane normal.  Eyes:     General:        Right eye: No discharge.        Left eye: No discharge.     Pupils: Pupils are equal, round, and reactive to [place].  Neck:     Thyroid: No thyromegaly.  Cardiovascular:     Rate and Rhythm: Normal rate and regular rhythm.     Heart sounds: Normal heart sounds. No murmur heard. Pulmonary:     Effort: Pulmonary effort is normal. No respiratory distress.     Breath sounds: Wheezing and rhonchi present.  Abdominal:     General: Bowel sounds are normal. There is no distension.     Palpations: Abdomen is soft.     Tenderness: There is no abdominal tenderness.  Musculoskeletal:        General: No tenderness. Normal range of motion.     Cervical back: Normal range of motion and neck supple.     Right lower leg: Edema (trace) present.     Left lower leg: Edema (trace) present.     Comments: Bilateral discoloration of legs   Skin:    General: Skin is warm and dry.     Findings: Erythema present. No rash.     Comments: Slight erythemas of right elbow, no warmth or tenderness noted   Neurological:     Mental Status: He is alert and oriented to person, place, and time.     Cranial Nerves: No cranial nerve deficit.     Deep Tendon Reflexes: Reflexes are normal and symmetric.  Psychiatric:        Behavior: Behavior normal.        Thought Content: Thought content normal.        Judgment: Judgment normal.          BP 123/76   Pulse 83   Temp 98.9 F (37.2 C) (Temporal)   Ht 5\' 10"  (1.778 m)   Wt (!) 307 lb (139.3 kg)   SpO2 98%   BMI 44.05 kg/m   Assessment & Plan:  Vincent Griffin comes in today with chief complaint of Annual Exam   Diagnosis and orders addressed:  1. Annual physical exam (Primary) - CMP14+EGFR - Lipid panel - Anemia Profile B - TSH - Ambulatory referral to Sleep Studies  2. Chronic coronary microvascular dysfunction -  CMP14+EGFR - Lipid panel - Budeson-Glycopyrrol-Formoterol (BREZTRI AEROSPHERE) 160-9-4.8 MCG/ACT AERO; Inhale 2 puffs into the lungs 2 (two) times daily.  Dispense: 10.7 g; Refill: 11  3. H/O non-ST elevation myocardial infarction (NSTEMI) - CMP14+EGFR - Lipid panel  4. Hyperlipidemia, unspecified hyperlipidemia type - CMP14+EGFR - Lipid panel  5. Current smoker - CMP14+EGFR  6. Morbid obesity (HCC) -  CMP14+EGFR - Ambulatory referral to Sleep Studies  7. PAD (peripheral artery disease) (HCC)  - CMP14+EGFR - Ambulatory referral to Vascular Surgery  8. Aneurysm artery, iliac (HCC) - CMP14+EGFR - Ambulatory referral to Vascular Surgery  9. Pulmonary emphysema, unspecified emphysema type (HCC) - CMP14+EGFR  10. Other fatigue - CMP14+EGFR - Anemia Profile B - TSH - Ambulatory referral to Sleep Studies  Referral to sleep study and vascular pending.  [employer] pending Health Maintenance reviewed Diet and exercise encouraged  Follow up plan: 3 months    [name], FNP

## 2023-07-18 NOTE — Addendum Note (Signed)
 Addended by: Ignacia Bayley on: 07/18/2023 03:20 PM   Modules accepted: Orders

## 2023-07-19 DIAGNOSIS — I517 Cardiomegaly: Secondary | ICD-10-CM | POA: Diagnosis not present

## 2023-07-19 DIAGNOSIS — I119 Hypertensive heart disease without heart failure: Secondary | ICD-10-CM | POA: Diagnosis not present

## 2023-07-19 DIAGNOSIS — Z85038 Personal history of other malignant neoplasm of large intestine: Secondary | ICD-10-CM | POA: Diagnosis not present

## 2023-07-19 DIAGNOSIS — R2243 Localized swelling, mass and lump, lower limb, bilateral: Secondary | ICD-10-CM | POA: Diagnosis not present

## 2023-07-19 DIAGNOSIS — J449 Chronic obstructive pulmonary disease, unspecified: Secondary | ICD-10-CM | POA: Diagnosis not present

## 2023-07-19 DIAGNOSIS — I723 Aneurysm of iliac artery: Secondary | ICD-10-CM | POA: Diagnosis not present

## 2023-07-19 DIAGNOSIS — I4510 Unspecified right bundle-branch block: Secondary | ICD-10-CM | POA: Diagnosis not present

## 2023-07-19 DIAGNOSIS — I2510 Atherosclerotic heart disease of native coronary artery without angina pectoris: Secondary | ICD-10-CM | POA: Diagnosis not present

## 2023-07-19 DIAGNOSIS — I739 Peripheral vascular disease, unspecified: Secondary | ICD-10-CM | POA: Diagnosis not present

## 2023-07-19 DIAGNOSIS — I252 Old myocardial infarction: Secondary | ICD-10-CM | POA: Diagnosis not present

## 2023-07-19 DIAGNOSIS — R918 Other nonspecific abnormal finding of lung field: Secondary | ICD-10-CM | POA: Diagnosis not present

## 2023-07-19 DIAGNOSIS — R0602 Shortness of breath: Secondary | ICD-10-CM | POA: Diagnosis not present

## 2023-07-19 DIAGNOSIS — Z7982 Long term (current) use of aspirin: Secondary | ICD-10-CM | POA: Diagnosis not present

## 2023-07-19 DIAGNOSIS — N200 Calculus of kidney: Secondary | ICD-10-CM | POA: Diagnosis not present

## 2023-07-19 DIAGNOSIS — Z955 Presence of coronary angioplasty implant and graft: Secondary | ICD-10-CM | POA: Diagnosis not present

## 2023-07-19 DIAGNOSIS — E785 Hyperlipidemia, unspecified: Secondary | ICD-10-CM | POA: Diagnosis not present

## 2023-07-19 DIAGNOSIS — Z79899 Other long term (current) drug therapy: Secondary | ICD-10-CM | POA: Diagnosis not present

## 2023-07-19 DIAGNOSIS — F17210 Nicotine dependence, cigarettes, uncomplicated: Secondary | ICD-10-CM | POA: Diagnosis not present

## 2023-07-19 DIAGNOSIS — R079 Chest pain, unspecified: Secondary | ICD-10-CM | POA: Diagnosis not present

## 2023-07-19 DIAGNOSIS — R0789 Other chest pain: Secondary | ICD-10-CM | POA: Diagnosis not present

## 2023-07-19 LAB — CMP14+EGFR
ALT: 25 IU/L (ref 0–44)
AST: 28 IU/L (ref 0–40)
Albumin: 3.9 g/dL (ref 3.8–4.9)
Alkaline Phosphatase: 107 IU/L (ref 44–121)
BUN/Creatinine Ratio: 11 (ref 10–24)
BUN: 11 mg/dL (ref 8–27)
Bilirubin Total: 0.5 mg/dL (ref 0.0–1.2)
CO2: 26 mmol/L (ref 20–29)
Calcium: 9.4 mg/dL (ref 8.6–10.2)
Chloride: 101 mmol/L (ref 96–106)
Creatinine, Ser: 1.02 mg/dL (ref 0.76–1.27)
Globulin, Total: 3 g/dL (ref 1.5–4.5)
Glucose: 87 mg/dL (ref 70–99)
Potassium: 4.9 mmol/L (ref 3.5–5.2)
Sodium: 140 mmol/L (ref 134–144)
Total Protein: 6.9 g/dL (ref 6.0–8.5)
eGFR: 84 mL/min/{1.73_m2} (ref >59)

## 2023-07-19 LAB — ANEMIA PROFILE B
Basophils Absolute: 0.1 10*3/uL (ref 0.0–0.2)
Basos: 1 %
EOS (ABSOLUTE): 0.3 10*3/uL (ref 0.0–0.4)
Eos: 3 %
Ferritin: 106 ng/mL (ref 30–400)
Folate: 5.3 ng/mL (ref >3.0)
Hematocrit: 43.7 % (ref 37.5–51.0)
Hemoglobin: 15 g/dL (ref 13.0–17.7)
Immature Grans (Abs): 0 10*3/uL (ref 0.0–0.1)
Immature Granulocytes: 0 %
Iron Saturation: 34 % (ref 15–55)
Iron: 111 ug/dL (ref 38–169)
Lymphocytes Absolute: 2.3 10*3/uL (ref 0.7–3.1)
Lymphs: 29 %
MCH: 34.2 pg — ABNORMAL HIGH (ref 26.6–33.0)
MCHC: 34.3 g/dL (ref 31.5–35.7)
MCV: 100 fL — ABNORMAL HIGH (ref 79–97)
Monocytes Absolute: 0.7 10*3/uL (ref 0.1–0.9)
Monocytes: 9 %
Neutrophils Absolute: 4.6 10*3/uL (ref 1.4–7.0)
Neutrophils: 58 %
Platelets: 226 10*3/uL (ref 150–450)
RBC: 4.38 x10E6/uL (ref 4.14–5.80)
RDW: 12.8 % (ref 11.6–15.4)
Retic Ct Pct: 1.4 % (ref 0.6–2.6)
Total Iron Binding Capacity: 330 ug/dL (ref 250–450)
UIBC: 219 ug/dL (ref 111–343)
Vitamin B-12: 105 pg/mL — ABNORMAL LOW (ref 232–1245)
WBC: 8 10*3/uL (ref 3.4–10.8)

## 2023-07-19 LAB — LIPID PANEL
Chol/HDL Ratio: 3.3 ratio (ref 0.0–5.0)
Cholesterol, Total: 115 mg/dL (ref 100–199)
HDL: 35 mg/dL — ABNORMAL LOW (ref >39)
LDL Chol Calc (NIH): 60 mg/dL (ref 0–99)
Triglycerides: 108 mg/dL (ref 0–149)
VLDL Cholesterol Cal: 20 mg/dL (ref 5–40)

## 2023-07-19 LAB — TSH: TSH: 1.68 u[IU]/mL (ref 0.450–4.500)

## 2023-07-20 DIAGNOSIS — R079 Chest pain, unspecified: Secondary | ICD-10-CM | POA: Diagnosis not present

## 2023-07-20 DIAGNOSIS — I517 Cardiomegaly: Secondary | ICD-10-CM | POA: Diagnosis not present

## 2023-07-20 DIAGNOSIS — R2243 Localized swelling, mass and lump, lower limb, bilateral: Secondary | ICD-10-CM | POA: Diagnosis not present

## 2023-07-20 DIAGNOSIS — I358 Other nonrheumatic aortic valve disorders: Secondary | ICD-10-CM | POA: Diagnosis not present

## 2023-07-21 ENCOUNTER — Other Ambulatory Visit: Payer: Self-pay | Admitting: Family Medicine

## 2023-07-21 DIAGNOSIS — E538 Deficiency of other specified B group vitamins: Secondary | ICD-10-CM

## 2023-07-21 MED ORDER — CYANOCOBALAMIN 1000 MCG/ML IJ SOLN
1000.0000 ug | Freq: Every day | INTRAMUSCULAR | Status: AC
Start: 2023-07-24 — End: 2023-07-28
  Administered 2023-07-24 – 2023-07-28 (×5): 1000 ug via INTRAMUSCULAR

## 2023-07-24 ENCOUNTER — Ambulatory Visit (INDEPENDENT_AMBULATORY_CARE_PROVIDER_SITE_OTHER)

## 2023-07-24 DIAGNOSIS — E538 Deficiency of other specified B group vitamins: Secondary | ICD-10-CM

## 2023-07-24 NOTE — Progress Notes (Signed)
 Patient is in office today for a nurse visit for B12 Injection. Patient Injection was given in the  Left deltoid. Patient tolerated injection well.

## 2023-07-25 ENCOUNTER — Ambulatory Visit (INDEPENDENT_AMBULATORY_CARE_PROVIDER_SITE_OTHER)

## 2023-07-25 ENCOUNTER — Telehealth: Payer: Self-pay | Admitting: Family

## 2023-07-25 DIAGNOSIS — E538 Deficiency of other specified B group vitamins: Secondary | ICD-10-CM

## 2023-07-25 NOTE — Telephone Encounter (Signed)
 LMTCB to schedule hospital f/u  Copied from CRM [id number]. Topic: Appointments - Scheduling Inquiry for Clinic >> Jul 25, 2023  9:03 AM Elle L wrote: Reason for CRM: The patient was in the hospital from the 5-6th for chest pain. However, I did not have a hospital follow up within 14 days. The patient's call back number is [phone].

## 2023-07-25 NOTE — Progress Notes (Signed)
 Patient is in office today for a nurse visit for B12 Injection. Patient Injection was given in the  Right deltoid. Patient tolerated injection well.

## 2023-07-26 ENCOUNTER — Ambulatory Visit (INDEPENDENT_AMBULATORY_CARE_PROVIDER_SITE_OTHER): Admitting: *Deleted

## 2023-07-26 DIAGNOSIS — E538 Deficiency of other specified B group vitamins: Secondary | ICD-10-CM | POA: Diagnosis not present

## 2023-07-26 NOTE — Progress Notes (Signed)
 Patient is in office today for a nurse visit for B12 Injection. Patient Injection was given in the  Left deltoid. Patient tolerated injection well.

## 2023-07-27 ENCOUNTER — Ambulatory Visit (INDEPENDENT_AMBULATORY_CARE_PROVIDER_SITE_OTHER)

## 2023-07-27 DIAGNOSIS — E538 Deficiency of other specified B group vitamins: Secondary | ICD-10-CM | POA: Diagnosis not present

## 2023-07-27 NOTE — Progress Notes (Signed)
 Patient is in office today for a nurse visit for B12 Injection. Patient injection was done in Right deltoid. Patient tolerated well.

## 2023-07-28 ENCOUNTER — Ambulatory Visit

## 2023-07-28 DIAGNOSIS — E538 Deficiency of other specified B group vitamins: Secondary | ICD-10-CM | POA: Diagnosis not present

## 2023-07-28 NOTE — Progress Notes (Signed)
 Patient is in office today for a nurse visit for B12 Injection. Patient Injection was given in the  Left deltoid. Patient tolerated injection well.

## 2023-08-03 ENCOUNTER — Encounter: Payer: BC Managed Care – PPO | Admitting: Family

## 2023-08-04 ENCOUNTER — Ambulatory Visit: Admitting: Family Medicine

## 2023-08-04 ENCOUNTER — Ambulatory Visit

## 2023-08-04 ENCOUNTER — Encounter: Payer: Self-pay | Admitting: Family Medicine

## 2023-08-04 VITALS — BP 124/72 | HR 71 | Temp 98.3°F | Ht 70.0 in | Wt 309.0 lb

## 2023-08-04 DIAGNOSIS — E538 Deficiency of other specified B group vitamins: Secondary | ICD-10-CM | POA: Diagnosis not present

## 2023-08-04 DIAGNOSIS — J439 Emphysema, unspecified: Secondary | ICD-10-CM | POA: Diagnosis not present

## 2023-08-04 DIAGNOSIS — J441 Chronic obstructive pulmonary disease with (acute) exacerbation: Secondary | ICD-10-CM | POA: Diagnosis not present

## 2023-08-04 DIAGNOSIS — R221 Localized swelling, mass and lump, neck: Secondary | ICD-10-CM

## 2023-08-04 DIAGNOSIS — Z1331 Encounter for screening for depression: Secondary | ICD-10-CM

## 2023-08-04 MED ORDER — ALBUTEROL SULFATE HFA 108 (90 BASE) MCG/ACT IN AERS: 2.0000 | INHALATION_SPRAY | Freq: Four times a day (QID) | RESPIRATORY_TRACT | 2 refills | Status: AC | PRN

## 2023-08-04 MED ORDER — CYANOCOBALAMIN 1000 MCG/ML IJ SOLN
1000.0000 ug | Freq: Once | INTRAMUSCULAR | Status: AC
  Administered 2023-08-04: 1000 ug via INTRAMUSCULAR

## 2023-08-04 MED ORDER — AZITHROMYCIN 250 MG PO TABS: ORAL_TABLET | ORAL | 0 refills | Status: AC

## 2023-08-04 MED ORDER — PREDNISONE 20 MG PO TABS: 40.0000 mg | ORAL_TABLET | Freq: Every day | ORAL | 0 refills | Status: AC

## 2023-08-04 NOTE — Progress Notes (Signed)
 Subjective:  Patient ID: Vincent Griffin, male    DOB: 1960-09-18, 63 y.o.   MRN: [id number]  Patient Care Team: Junie Spencer, FNP as PCP - General (Family Medicine) Jena Gauss, Gerrit Friends, MD as Consulting Physician (Gastroenterology)   Chief Complaint:  chest and head  congestion (Yellow mucus/X 1 day)  HPI: Vincent Griffin is a 63 y.o. male presenting on 08/04/2023 for chest and head  congestion (Yellow mucus/X 1 day) March 4th had pneumonia shot, 3 days after started with rhinorrhea. Started zyrtec and benadryl for a few nights. Rhinorrhea improved briefly. States that chest became congested. Started coughing and producing yellow sputum. States that congestion worsened yesterday evening. States that he now has coughing, gagging from production, headaches, rhinorrhea. Denies fever, N/V/D. Denies recent antibiotics   Relevant past medical, surgical, family, and social history reviewed and updated as indicated.  Allergies and medications reviewed and updated. Data reviewed: Chart in Epic.   Past Medical History:  Diagnosis Date   CAD (coronary artery disease)    History of kidney stones    Hypercholesterolemia    Hypertension    Myocardial infarction [facility]) 2022    Past Surgical History:  Procedure Laterality Date   BIOPSY  09/08/2022   Procedure: BIOPSY;  Surgeon: [name], DO;  [facility];  Service: Endoscopy;;   BIOPSY  04/10/2023   Procedure: BIOPSY;  Surgeon: [name], DO;  Location[facility];  Service: Endoscopy;;   CARDIAC CATHETERIZATION  07/2020   with stent.   CATARACT EXTRACTION     CATARACT EXTRACTION W/PHACO Left 01/07/2016   Procedure: CATARACT EXTRACTION PHACO AND INTRAOCULAR LENS PLACEMENT LEFT EYE CDE=13.32;  Surgeon: [name], MD;  Location: [facility];  Service: Ophthalmology;  Laterality: Left;   COLONOSCOPY WITH PROPOFOL N/A 09/08/2022   Procedure: COLONOSCOPY WITH PROPOFOL;  Surgeon: [name], DO;  Location[facility];  Service: Endoscopy;  Laterality: N/A;  11:30 am   COLONOSCOPY WITH PROPOFOL N/A 04/10/2023   Procedure: COLONOSCOPY WITH PROPOFOL;  Surgeon: [name], DO;  [facility];  Service: Endoscopy;  Laterality: N/A;  100pm, asa 3   POLYPECTOMY  09/08/2022   Procedure: POLYPECTOMY;  Surgeon: [name], DO;  [facility];  Service: Endoscopy;;   POLYPECTOMY  04/10/2023   Procedure: POLYPECTOMY;  Surgeon: [name], DO;  [facility];  Service: Endoscopy;;   ROBOT ASSISTED LAPAROSCOPIC PARTIAL COLECTOMY  10/07/2022   SMALL INTESTINE SURGERY  1972   SUBMUCOSAL TATTOO INJECTION  09/08/2022   Procedure: SUBMUCOSAL TATTOO INJECTION;  Surgeon: [name], DO;  [facility];  Service: Endoscopy;;    Social History   Socioeconomic History   Marital status: Married    Spouse name: Not on file   Number of children: Not on file   Years of education: Not on file   Highest education level: Not on file  Occupational History   Not on file  Tobacco Use   Smoking status: Heavy Smoker    Current packs/day: 1.00    Types: Cigarettes   Smokeless tobacco: Never  Vaping Use   Vaping status: Never Used  Substance and Sexual Activity   Alcohol use: No   Drug use: No   Sexual activity: Not on file  Other Topics Concern   Not on file  Social History Narrative   Not on file   Social Drivers of Health  Financial Resource Strain: Low Risk  (08/12/2020)   Received from [facility], [facility]   Overall Financial Resource Strain (CARDIA)    Difficulty of Paying Living Expenses: Not hard at all  Food Insecurity: No Food Insecurity (10/14/2022)   Hunger Vital Sign    Worried About Running Out of Food in the Last Year: Never true    Ran Out of Food in the Last Year: Never true  Transportation Needs: No Transportation Needs (10/14/2022)   PRAPARE - Administrator, Civil Service (Medical): No    Lack of  Transportation (Non-Medical): No  Physical Activity: Not on file  Stress: No Stress Concern Present (08/12/2020)   Received from [facility], [facility] of Occupational Health - Occupational Stress Questionnaire    Feeling of Stress : Not at all  Social Connections: Unknown (09/20/2021)   Received from [facility], [facility]   Social Network    Social Network: Not on file  Intimate Partner Violence: Not At Risk (07/19/2023)   Received from [facility]   HITS    Over the last 12 months how often did your partner physically hurt you?: Never    Over the last 12 months how often did your partner insult you or talk down to you?: Never    Over the last 12 months how often did your partner threaten you with physical harm?: Never    Over the last 12 months how often did your partner scream or curse at you?: Never    Outpatient Encounter Medications as of 08/04/2023  Medication Sig   aspirin 81 MG chewable tablet Chew 81 mg by mouth daily.   atorvastatin (LIPITOR) 80 MG tablet Take 80 mg by mouth at bedtime.   Ferrous Sulfate (IRON PO) Take by mouth. Takes  1 -2 iron tablets per week   hydrocortisone (ANUSOL-HC) 2.5 % rectal cream Place 1 Application rectally 2 (two) times daily.   metoprolol succinate (TOPROL-XL) 25 MG 24 hr tablet Take 25 mg by mouth daily.   nitroGLYCERIN (NITROSTAT) 0.4 MG SL tablet Place 0.4 mg under the tongue every 5 (five) minutes as needed for chest pain.   Budeson-Glycopyrrol-Formoterol (BREZTRI AEROSPHERE) 160-9-4.8 MCG/ACT AERO Inhale 2 puffs into the lungs 2 (two) times daily. (Patient not taking: Reported on 08/04/2023)   No facility-administered encounter medications on file as of 08/04/2023.    Allergies  Allergen Reactions   Zetia [Ezetimibe] Diarrhea and Nausea Only    Upset stomach    Review of Systems As per HPI  Objective:  BP 124/72   Pulse 71   Temp 98.3 F (36.8 C)   Ht 5\' 10"  (1.778 m)   Wt (!) 309 lb (140.2 kg)    SpO2 94%   BMI 44.34 kg/m    Wt Readings from Last 3 Encounters:  08/04/23 (!) 309 lb (140.2 kg)  07/18/23 (!) 307 lb (139.3 kg)  06/15/23 (!) 306 lb 3.2 oz (138.9 kg)    Physical Exam Constitutional:      General: He is awake. He is not in acute distress.    Appearance: Normal appearance. He is well-developed and well-groomed. He is obese. He is not ill-appearing, toxic-appearing or diaphoretic.  HENT:     Right Ear: No drainage, swelling or tenderness. A middle ear effusion is present. There is no impacted cerumen. No foreign body. No mastoid tenderness. No PE tube. No hemotympanum. Tympanic membrane is not injected, scarred, perforated, erythematous, retracted or bulging.  Left Ear: No drainage, swelling or tenderness. A middle ear effusion is present. There is no impacted cerumen. No foreign body. No mastoid tenderness. No PE tube. No hemotympanum. Tympanic membrane is not injected, scarred, perforated, erythematous, retracted or bulging.     Nose: Congestion and rhinorrhea present. Rhinorrhea is clear.     Right Sinus: No maxillary sinus tenderness or frontal sinus tenderness.     Left Sinus: No maxillary sinus tenderness or frontal sinus tenderness.     Mouth/Throat:     Lips: Pink. No lesions.  Cardiovascular:     Rate and Rhythm: Normal rate and regular rhythm.     Pulses: Normal pulses.          Radial pulses are 2+ on the right side and 2+ on the left side.       Posterior tibial pulses are 2+ on the right side and 2+ on the left side.     Heart sounds: Normal heart sounds. No murmur heard.    No gallop.  Pulmonary:     Effort: Pulmonary effort is normal. No respiratory distress.     Breath sounds: Decreased air movement present. No stridor. Examination of the right-upper field reveals wheezing. Examination of the left-upper field reveals wheezing. Examination of the right-middle field reveals wheezing. Examination of the left-middle field reveals wheezing. Examination  of the right-lower field reveals wheezing. Examination of the left-lower field reveals wheezing. Wheezing present. No rhonchi or rales.  Musculoskeletal:     Cervical back: Full passive range of motion without pain and neck supple.     Right lower leg: No edema.     Left lower leg: No edema.  Lymphadenopathy:     Head:     Right side of head: Tonsillar adenopathy present. No submental, submandibular, preauricular or posterior auricular adenopathy.     Left side of head: Tonsillar adenopathy present. No submental, submandibular, preauricular or posterior auricular adenopathy.     Cervical:     Right cervical: No superficial cervical adenopathy.    Left cervical: No superficial cervical adenopathy.     Comments: Large, nontender tonsillar nodes on bilateral neck   Skin:    General: Skin is warm.     Capillary Refill: Capillary refill takes less than 2 seconds.  Neurological:     General: No focal deficit present.     Mental Status: He is alert, oriented to person, place, and time and easily aroused. Mental status is at baseline.     GCS: GCS eye subscore is 4. GCS verbal subscore is 5. GCS motor subscore is 6.     Motor: No weakness.  Psychiatric:        Attention and Perception: Attention and perception normal.        Mood and Affect: Mood and affect normal.        Speech: Speech normal.        Behavior: Behavior normal. Behavior is cooperative.        Thought Content: Thought content normal. Thought content does not include homicidal or suicidal ideation. Thought content does not include homicidal or suicidal plan.        Cognition and Memory: Cognition and memory normal.        Judgment: Judgment normal.     Results for orders placed or performed in visit on 07/18/23  CMP14+EGFR   Collection Time: 07/18/23  3:21 PM  Result Value Ref Range   Glucose 87 70 - 99 mg/dL   BUN 11 8 -  27 mg/dL   Creatinine, Ser 7.42 0.76 - 1.27 mg/dL   eGFR 84 >59 DG/LOV/5.64   BUN/Creatinine Ratio  11 10 - 24   Sodium 140 134 - 144 mmol/L   Potassium 4.9 3.5 - 5.2 mmol/L   Chloride 101 96 - 106 mmol/L   CO2 26 20 - 29 mmol/L   Calcium 9.4 8.6 - 10.2 mg/dL   Total Protein 6.9 6.0 - 8.5 g/dL   Albumin 3.9 3.8 - 4.9 g/dL   Globulin, Total 3.0 1.5 - 4.5 g/dL   Bilirubin Total 0.5 0.0 - 1.2 mg/dL   Alkaline Phosphatase 107 44 - 121 IU/L   AST 28 0 - 40 IU/L   ALT 25 0 - 44 IU/L  Lipid panel   Collection Time: 07/18/23  3:21 PM  Result Value Ref Range   Cholesterol, Total 115 100 - 199 mg/dL   Triglycerides 332 0 - 149 mg/dL   HDL 35 (L) >95 mg/dL   VLDL Cholesterol Cal 20 5 - 40 mg/dL   LDL Chol Calc (NIH) 60 0 - 99 mg/dL   Chol/HDL Ratio 3.3 0.0 - 5.0 ratio  Anemia Profile B   Collection Time: 07/18/23  3:21 PM  Result Value Ref Range   Total Iron Binding Capacity 330 250 - 450 ug/dL   UIBC 188 416 - 606 ug/dL   Iron 301 38 - 601 ug/dL   Iron Saturation 34 15 - 55 %   Ferritin 106 30 - 400 ng/mL   Vitamin B-12 105 (L) 232 - 1,245 pg/mL   Folate 5.3 >3.0 ng/mL   WBC 8.0 3.4 - 10.8 x10E3/uL   RBC 4.38 4.14 - 5.80 x10E6/uL   Hemoglobin 15.0 13.0 - 17.7 g/dL   Hematocrit 09.3 23.5 - 51.0 %   MCV 100 (H) 79 - 97 fL   MCH 34.2 (H) 26.6 - 33.0 pg   MCHC 34.3 31.5 - 35.7 g/dL   RDW 57.3 22.0 - 25.4 %   Platelets 226 150 - 450 x10E3/uL   Neutrophils 58 Not Estab. %   Lymphs 29 Not Estab. %   Monocytes 9 Not Estab. %   Eos 3 Not Estab. %   Basos 1 Not Estab. %   Neutrophils Absolute 4.6 1.4 - 7.0 x10E3/uL   Lymphocytes Absolute 2.3 0.7 - 3.1 x10E3/uL   Monocytes Absolute 0.7 0.1 - 0.9 x10E3/uL   EOS (ABSOLUTE) 0.3 0.0 - 0.4 x10E3/uL   Basophils Absolute 0.1 0.0 - 0.2 x10E3/uL   Immature Granulocytes 0 Not Estab. %   Immature Grans (Abs) 0.0 0.0 - 0.1 x10E3/uL   Retic Ct Pct 1.4 0.6 - 2.6 %  TSH   Collection Time: 07/18/23  3:21 PM  Result Value Ref Range   TSH 1.680 0.450 - 4.500 uIU/mL       08/04/2023   11:58 AM 06/22/2022   10:25 AM 03/10/2021    8:43 AM  06/05/2020   12:14 PM 05/29/2020    9:45 AM  Depression screen PHQ 2/9  Decreased Interest 1 0 0 0 0  Down, Depressed, Hopeless 1 0 0 0 0  PHQ - 2 Score 2 0 0 0 0  Altered sleeping 1 1 0    Tired, decreased energy 0 1 0    Change in appetite 2 0 0    Feeling bad or failure about yourself  1 0 0    Trouble concentrating 1 0 0    Moving slowly or fidgety/restless 1 0 0  Suicidal thoughts 0 0 0    PHQ-9 Score 8 2 0    Difficult doing work/chores Not difficult at all Not difficult at all Not difficult at all         08/04/2023   11:58 AM 03/10/2021    8:43 AM  GAD 7 : Generalized Anxiety Score  Nervous, Anxious, on Edge 1 0  Control/stop worrying 1 0  Worry too much - different things 1 0  Trouble relaxing 1 0  Restless 0 0  Easily annoyed or irritable 1 0  Afraid - awful might happen 0 0  Total GAD 7 Score 5 0  Anxiety Difficulty Not difficult at all Not difficult at all   Pertinent labs & imaging results that were available during my care of the patient were reviewed by me and considered in my medical decision making.  Assessment & Plan:  Vincent Griffin "Hessie Diener" was seen today for chest and head  congestion.  Diagnoses and all orders for this visit:  1. Pulmonary emphysema, unspecified emphysema type (HCC) (Primary) Will start medications as below. Discussed with patient how to use Albuterol. Provided patient with sample of Breztri in office.  - albuterol (VENTOLIN HFA) 108 (90 Base) MCG/ACT inhaler; Inhale 2 puffs into the lungs every 6 (six) hours as needed for wheezing or shortness of breath.  Dispense: 8 g; Refill: 2 - azithromycin (ZITHROMAX) 250 MG tablet; Take 2 tablets on day 1, then 1 tablet daily on days 2 through 5  Dispense: 6 tablet; Refill: 0 - predniSONE (DELTASONE) 20 MG tablet; Take 2 tablets (40 mg total) by mouth daily with breakfast for 5 days.  Dispense: 10 tablet; Refill: 0  2. COPD exacerbation (HCC) As above.  - albuterol (VENTOLIN HFA) 108 (90 Base) MCG/ACT  inhaler; Inhale 2 puffs into the lungs every 6 (six) hours as needed for wheezing or shortness of breath.  Dispense: 8 g; Refill: 2 - azithromycin (ZITHROMAX) 250 MG tablet; Take 2 tablets on day 1, then 1 tablet daily on days 2 through 5  Dispense: 6 tablet; Refill: 0 - predniSONE (DELTASONE) 20 MG tablet; Take 2 tablets (40 mg total) by mouth daily with breakfast for 5 days.  Dispense: 10 tablet; Refill: 0  3. Neck nodule Large, nontender nodules bilateral neck. Reviewed imaging as below. Recommend that patient follow up for further evaluation if they do not resolve after treatment of above.   4. B12 deficiency - cyanocobalamin (VITAMIN B12) injection 1,000 mcg  5. Encounter for screening for depression Patient positive on screenings for depression and anxiety. Denies SI. Recommend patient follow up with PCP.   Continue all other maintenance medications.  Follow up plan: Return if symptoms worsen or fail to improve.   Continue healthy lifestyle choices, including diet (rich in fruits, vegetables, and lean proteins, and low in salt and simple carbohydrates) and exercise (at least 30 minutes of moderate physical activity daily).  Written and verbal instructions provided   The above assessment and management plan was discussed with the patient. The patient verbalized understanding of and has agreed to the management plan. Patient is aware to call the clinic if they develop any new symptoms or if symptoms persist or worsen. Patient is aware when to return to the clinic for a follow-up visit. Patient educated on when it is appropriate to go to the emergency department.   Neale Burly, DNP-FNP [facility] [address] [place], [place] [address] [phone]

## 2023-08-04 NOTE — Progress Notes (Signed)
B12 injection given right deltoid intramuscular. Patient tolerated well.

## 2023-08-11 ENCOUNTER — Ambulatory Visit (INDEPENDENT_AMBULATORY_CARE_PROVIDER_SITE_OTHER)

## 2023-08-11 ENCOUNTER — Other Ambulatory Visit: Payer: Self-pay | Admitting: Family Medicine

## 2023-08-11 DIAGNOSIS — E538 Deficiency of other specified B group vitamins: Secondary | ICD-10-CM

## 2023-08-11 MED ORDER — CYANOCOBALAMIN 1000 MCG/ML IJ SOLN
1000.0000 ug | Freq: Once | INTRAMUSCULAR | Status: AC
  Administered 2023-08-11: 1000 ug via INTRAMUSCULAR

## 2023-08-11 NOTE — Progress Notes (Signed)
 Patient is in office today for a nurse visit for B12 Injection. Patient Injection was given in the  Left deltoid. Patient tolerated injection well.

## 2023-08-14 NOTE — Progress Notes (Unsigned)
 Patient name: Vincent Griffin MRN: [id number] DOB: 08/31/1960 Sex: male  REASON FOR CONSULT: Right common iliac artery aneurysm  HPI: Vincent Griffin is a 63 y.o. 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  Patient is followed by cardiology at [facility] and was last seen last year.  Denies any chest pain today.  Past Medical History:  Diagnosis Date   CAD (coronary artery disease)    History of kidney stones    Hypercholesterolemia    Hypertension    Myocardial infarction [facility]) 2022    Past Surgical History:  Procedure Laterality Date   BIOPSY  09/08/2022   Procedure: BIOPSY;  Surgeon: [name], DO;  [facility];  Service: Endoscopy;;   BIOPSY  04/10/2023   Procedure: BIOPSY;  Surgeon: [name], DO;  [facility];  Service: Endoscopy;;   CARDIAC CATHETERIZATION  07/2020   with stent.   CATARACT EXTRACTION     CATARACT EXTRACTION W/PHACO Left 01/07/2016   Procedure: CATARACT EXTRACTION PHACO AND INTRAOCULAR LENS PLACEMENT LEFT EYE CDE=13.32;  Surgeon: [name], MD;  Location: [facility];  Service: Ophthalmology;  Laterality: Left;   COLONOSCOPY WITH PROPOFOL N/A 09/08/2022   Procedure: COLONOSCOPY WITH PROPOFOL;  Surgeon: [name], DO;  [facility];  Service: Endoscopy;  Laterality: N/A;  11:30 am   COLONOSCOPY WITH PROPOFOL N/A 04/10/2023   Procedure: COLONOSCOPY WITH PROPOFOL;  Surgeon: [name], DO;  [facility];  Service: Endoscopy;  Laterality: N/A;  100pm, asa 3   POLYPECTOMY  09/08/2022   Procedure: POLYPECTOMY;  Surgeon: [name], DO;  [facility];  Service:  Endoscopy;;   POLYPECTOMY  04/10/2023   Procedure: POLYPECTOMY;  Surgeon: [name], DO;  [facility];  Service: Endoscopy;;   ROBOT ASSISTED LAPAROSCOPIC PARTIAL COLECTOMY  10/07/2022   SMALL INTESTINE SURGERY  1972   SUBMUCOSAL TATTOO INJECTION  09/08/2022   Procedure: SUBMUCOSAL TATTOO INJECTION;  Surgeon: [name], DO;  [facility];  Service: Endoscopy;;    Family History  Problem Relation Age of Onset   Diabetes Mother    Stroke Mother    Colon polyps Brother    Colon cancer Neg Hx     SOCIAL HISTORY: Social History   Socioeconomic History   Marital status: Married    Spouse name: Not on file   Number of children: Not on file   Years of education: Not on file   Highest education level: Not on file  Occupational History   Not on file  Tobacco Use   Smoking status: Heavy Smoker    Current packs/day: 1.00    Types: Cigarettes   Smokeless tobacco: Never  Vaping Use   Vaping status: Never Used  Substance and Sexual Activity   Alcohol use: No   Drug use: No   Sexual activity: Not on file  Other Topics Concern   Not on file  Social History Narrative   Not on file   Social Drivers of Health   Financial Resource Strain: Low Risk  (08/12/2020)   Received from [facility], [facility]   Overall  Financial Resource Strain (CARDIA)    Difficulty of Paying Living Expenses: Not hard at all  Food Insecurity: No Food Insecurity (10/14/2022)   Hunger Vital Sign    Worried About Running Out of Food in the Last Year: Never true    Ran Out of Food in the Last Year: Never true  Transportation Needs: No Transportation Needs (10/14/2022)   PRAPARE - Administrator, Civil Service (Medical): No    Lack of Transportation (Non-Medical): No  Physical Activity: Not on file  Stress: No Stress Concern Present (08/12/2020)   Received from [facility], [facility] of Occupational Health - Occupational Stress  Questionnaire    Feeling of Stress : Not at all  Social Connections: Unknown (09/20/2021)   Received from [facility], [facility]   Social Network    Social Network: Not on file  Intimate Partner Violence: Not At Risk (07/19/2023)   Received from [facility]   HITS    Over the last 12 months how often did your partner physically hurt you?: Never    Over the last 12 months how often did your partner insult you or talk down to you?: Never    Over the last 12 months how often did your partner threaten you with physical harm?: Never    Over the last 12 months how often did your partner scream or curse at you?: Never    Allergies  Allergen Reactions   Zetia [Ezetimibe] Diarrhea and Nausea Only    Upset stomach    Current Outpatient Medications  Medication Sig Dispense Refill   albuterol (VENTOLIN HFA) 108 (90 Base) MCG/ACT inhaler Inhale 2 puffs into the lungs every 6 (six) hours as needed for wheezing or shortness of breath. 8 g 2   aspirin 81 MG chewable tablet Chew 81 mg by mouth daily.     atorvastatin (LIPITOR) 80 MG tablet Take 80 mg by mouth at bedtime.     Budeson-Glycopyrrol-Formoterol (BREZTRI AEROSPHERE) 160-9-4.8 MCG/ACT AERO Inhale 2 puffs into the lungs 2 (two) times daily. (Patient not taking: Reported on 08/04/2023) 10.7 g 11   Ferrous Sulfate (IRON PO) Take by mouth. Takes  1 -2 iron tablets per week     hydrocortisone (ANUSOL-HC) 2.5 % rectal cream Place 1 Application rectally 2 (two) times daily. 30 g 1   metoprolol succinate (TOPROL-XL) 25 MG 24 hr tablet Take 25 mg by mouth daily.     nitroGLYCERIN (NITROSTAT) 0.4 MG SL tablet Place 0.4 mg under the tongue every 5 (five) minutes as needed for chest pain.     No current facility-administered medications for this visit.    REVIEW OF SYSTEMS:  [X]  denotes positive finding, [ ]  denotes negative finding Cardiac  Comments:  Chest pain or chest pressure:    Shortness of breath upon exertion:    Short of breath when  lying flat:    Irregular heart rhythm:        Vascular    Pain in calf, thigh, or hip brought on by ambulation:    Pain in feet at night that wakes you up from your sleep:     Blood clot in your veins:    Leg swelling:         Pulmonary    Oxygen at home:    Productive cough:     Wheezing:         Neurologic    Sudden weakness in arms or legs:  Sudden numbness in arms or legs:     Sudden onset of difficulty speaking or slurred speech:    Temporary loss of vision in one eye:     Problems with dizziness:         Gastrointestinal    Blood in stool:     Vomited blood:         Genitourinary    Burning when urinating:     Blood in urine:        Psychiatric    Major depression:         Hematologic    Bleeding problems:    Problems with blood clotting too easily:        Skin    Rashes or ulcers:        Constitutional    Fever or chills:      PHYSICAL EXAM: There were no vitals filed for this visit.  GENERAL: The patient is a well-nourished male, in no acute distress. The vital signs are documented above. CARDIAC: There is a regular rate and rhythm.  VASCULAR:  Bilateral femoral pulses palpable Bilateral DP pulses palpable Bilateral lower extremity venous stasis changes PULMONARY: No respiratory distress. ABDOMEN: Soft and non-tender. MUSCULOSKELETAL: There are no major deformities or cyanosis. NEUROLOGIC: No focal weakness or paresthesias are detected. SKIN: There are no ulcers or rashes noted. PSYCHIATRIC: The patient has a normal affect.  DATA:   CT chest abdomen pelvis reviewed 06/01/2023 3.5 cm right common iliac artery aneurysm.  Assessment/Plan:  63 y.o. 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  He then had a follow-up CT on 06/01/2023 showing a 3.4  cm right common iliac artery aneurysm per radiology report.  I reviewed the CT imaging and I measure his right common iliac aneurysm at 3.5 cm.  I discussed that we repair these at 3 to 3.5 cm due to risk of rupture.  I have recommended repair with stent graft.  Given there is no room to get seal in the proximal common iliac will require traditional aortobiiliac stent graft with possible coil embolization of the right hypogastric.  I discussed this being done in the operating room in [facility] with bilateral transfemoral access and percutaneous closure.  Discussed risk of anesthesia, stroke, MI, groin cutdown, pelvic ischemia etc.  Will get scheduled at his convenience.   Cephus Shelling, MD [facility] Office: [phone]

## 2023-08-15 ENCOUNTER — Ambulatory Visit (INDEPENDENT_AMBULATORY_CARE_PROVIDER_SITE_OTHER): Admitting: Vascular Surgery

## 2023-08-15 ENCOUNTER — Encounter: Payer: Self-pay | Admitting: Vascular Surgery

## 2023-08-15 ENCOUNTER — Inpatient Hospital Stay: Attending: Hematology

## 2023-08-15 VITALS — BP 149/79 | HR 73 | Temp 97.9°F | Resp 24 | Ht 70.0 in | Wt 312.0 lb

## 2023-08-15 DIAGNOSIS — R918 Other nonspecific abnormal finding of lung field: Secondary | ICD-10-CM | POA: Insufficient documentation

## 2023-08-15 DIAGNOSIS — Z9049 Acquired absence of other specified parts of digestive tract: Secondary | ICD-10-CM | POA: Insufficient documentation

## 2023-08-15 DIAGNOSIS — F17210 Nicotine dependence, cigarettes, uncomplicated: Secondary | ICD-10-CM | POA: Diagnosis not present

## 2023-08-15 DIAGNOSIS — I723 Aneurysm of iliac artery: Secondary | ICD-10-CM | POA: Diagnosis not present

## 2023-08-15 DIAGNOSIS — E611 Iron deficiency: Secondary | ICD-10-CM | POA: Insufficient documentation

## 2023-08-15 DIAGNOSIS — C180 Malignant neoplasm of cecum: Secondary | ICD-10-CM | POA: Insufficient documentation

## 2023-08-15 LAB — COMPREHENSIVE METABOLIC PANEL WITH GFR
ALT: 29 U/L (ref 0–44)
AST: 24 U/L (ref 15–41)
Albumin: 3.4 g/dL — ABNORMAL LOW (ref 3.5–5.0)
Alkaline Phosphatase: 84 U/L (ref 38–126)
Anion gap: 7 (ref 5–15)
BUN: 10 mg/dL (ref 6–20)
CO2: 30 mmol/L (ref 22–32)
Calcium: 8.9 mg/dL (ref 8.9–10.3)
Chloride: 100 mmol/L (ref 98–111)
Creatinine, Ser: 0.96 mg/dL (ref 0.61–1.24)
GFR, Estimated: >60 mL/min (ref >60)
Glucose, Bld: 96 mg/dL (ref 70–99)
Potassium: 4.4 mmol/L (ref 3.5–5.1)
Sodium: 137 mmol/L (ref 135–145)
Total Bilirubin: 0.9 mg/dL (ref 0.0–1.2)
Total Protein: 7.2 g/dL (ref 6.5–8.1)

## 2023-08-15 LAB — CBC WITH DIFFERENTIAL/PLATELET
Abs Immature Granulocytes: 0.04 10*3/uL (ref 0.00–0.07)
Basophils Absolute: 0.1 10*3/uL (ref 0.0–0.1)
Basophils Relative: 1 %
Eosinophils Absolute: 0.3 10*3/uL (ref 0.0–0.5)
Eosinophils Relative: 3 %
HCT: 47.7 % (ref 39.0–52.0)
Hemoglobin: 15.2 g/dL (ref 13.0–17.0)
Immature Granulocytes: 0 %
Lymphocytes Relative: 19 %
Lymphs Abs: 1.8 10*3/uL (ref 0.7–4.0)
MCH: 32.9 pg (ref 26.0–34.0)
MCHC: 31.9 g/dL (ref 30.0–36.0)
MCV: 103.2 fL — ABNORMAL HIGH (ref 80.0–100.0)
Monocytes Absolute: 0.6 10*3/uL (ref 0.1–1.0)
Monocytes Relative: 6 %
Neutro Abs: 6.5 10*3/uL (ref 1.7–7.7)
Neutrophils Relative %: 71 %
Platelets: 195 10*3/uL (ref 150–400)
RBC: 4.62 MIL/uL (ref 4.22–5.81)
RDW: 13.8 % (ref 11.5–15.5)
WBC: 9.2 10*3/uL (ref 4.0–10.5)
nRBC: 0 % (ref 0.0–0.2)

## 2023-08-16 ENCOUNTER — Telehealth: Payer: Self-pay

## 2023-08-16 LAB — CEA: CEA: 2.4 ng/mL (ref 0.0–4.7)

## 2023-08-16 NOTE — Telephone Encounter (Signed)
 Attempted to call for surgery scheduling. LVM

## 2023-08-17 ENCOUNTER — Other Ambulatory Visit: Payer: Self-pay

## 2023-08-17 ENCOUNTER — Telehealth: Payer: Self-pay

## 2023-08-17 DIAGNOSIS — I723 Aneurysm of iliac artery: Secondary | ICD-10-CM

## 2023-08-17 NOTE — Telephone Encounter (Signed)
 Attempted to call for surgery scheduling. LVM

## 2023-08-18 ENCOUNTER — Ambulatory Visit (INDEPENDENT_AMBULATORY_CARE_PROVIDER_SITE_OTHER)

## 2023-08-18 DIAGNOSIS — E538 Deficiency of other specified B group vitamins: Secondary | ICD-10-CM | POA: Diagnosis not present

## 2023-08-18 MED ORDER — CYANOCOBALAMIN 1000 MCG/ML IJ SOLN
1000.0000 ug | INTRAMUSCULAR | Status: AC
  Administered 2023-08-18 – 2024-01-09 (×5): 1000 ug via INTRAMUSCULAR

## 2023-08-18 NOTE — Progress Notes (Signed)
 Patient is in office today for a nurse visit for B12 Injection. Patient Injection was given in the  Right deltoid. Patient tolerated injection well.

## 2023-08-22 ENCOUNTER — Inpatient Hospital Stay: Admitting: Hematology

## 2023-08-22 VITALS — BP 113/79 | HR 63 | Temp 97.6°F | Resp 20 | Wt 308.6 lb

## 2023-08-22 DIAGNOSIS — E611 Iron deficiency: Secondary | ICD-10-CM | POA: Diagnosis not present

## 2023-08-22 DIAGNOSIS — C180 Malignant neoplasm of cecum: Secondary | ICD-10-CM

## 2023-08-22 DIAGNOSIS — Z9049 Acquired absence of other specified parts of digestive tract: Secondary | ICD-10-CM | POA: Diagnosis not present

## 2023-08-22 DIAGNOSIS — D508 Other iron deficiency anemias: Secondary | ICD-10-CM | POA: Diagnosis not present

## 2023-08-22 DIAGNOSIS — R918 Other nonspecific abnormal finding of lung field: Secondary | ICD-10-CM | POA: Diagnosis not present

## 2023-08-22 DIAGNOSIS — F17210 Nicotine dependence, cigarettes, uncomplicated: Secondary | ICD-10-CM | POA: Diagnosis not present

## 2023-08-22 NOTE — Progress Notes (Signed)
 [facility] [address] [place] [address]    Clinic Day:  08/22/2023  Referring physician: Junie Spencer, FNP  Patient Care Team: Junie Spencer, FNP as PCP - General (Family Medicine) Jena Gauss Gerrit Friends, MD as Consulting Physician (Gastroenterology)   ASSESSMENT & PLAN:   Assessment: 1.  Stage II (T3 N0) cecal adenocarcinoma: - Colonoscopy (09/08/2022): Cecal mass - CT CAP (09/13/2022): Large cecal mass, small pericolonic and ileocolic nodes.  Multiple bilateral pulmonary nodules measuring up to 7 mm. - CEA (10/05/2022): 42.1 - Robotic assisted laparoscopic right hemicolectomy on 10/07/2022 by Dr. Lovell Sheehan. - Pathology: Invasive moderately differentiated adenocarcinoma, margins negative, 0/18 lymph nodes involved, pT3 pN0, MMR preserved.   2.  Social/family history: - Lives at home with his wife and is independent of ADLs and IADLs.  He is now working at a [employer].  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  Current active smoker, 1 pack/day, started at age 46. - 2-3 maternal aunts had cancer.  He does not know the type.    Plan: 1.  Stage II (T3 N0 G2) cecal adenocarcinoma: - He denies any change in bowel habits.  Denies bleeding per rectum or melena. - Reviewed labs from 08/15/2023: Normal LFTs.  CEA was normal at 2.4.  CBC was normal. - He has iliac aneurysm surgery on 09/06/2023. - Recommend follow-up in 3 months with repeat labs and CTAP with contrast.  2.  Iron deficiency state: - He is taking iron tablet twice weekly.  Last ferritin was 106 on 07/18/2023.  Will check ferritin and iron panel at next visit.  He had low B12 levels in March, was given B12 injections.     Orders Placed This Encounter  Procedures   CT ABDOMEN PELVIS W CONTRAST    Standing Status:   Future    Expected Date:   11/21/2023    Expiration Date:   08/21/2024    If indicated for the ordered procedure, I authorize the administration of contrast media per  Radiology protocol:   Yes    Does the patient have a contrast media/X-ray dye allergy?:   No    Preferred imaging location?:   [facility]    If indicated for the ordered procedure, I authorize the administration of oral contrast media per Radiology protocol:   Yes   CBC with Differential    Standing Status:   Future    Expected Date:   11/20/2023    Expiration Date:   08/21/2024   Comprehensive metabolic panel    Standing Status:   Future    Expected Date:   11/20/2023    Expiration Date:   08/21/2024   CEA    Standing Status:   Future    Expected Date:   11/20/2023    Expiration Date:   08/21/2024   Iron and TIBC ([facility] ONLY)    Standing Status:   Future    Expected Date:   11/20/2023    Expiration Date:   08/21/2024   Ferritin    Standing Status:   Future    Expected Date:   11/20/2023    Expiration Date:   08/21/2024      Mikeal Hawthorne R Teague,acting as a scribe for Doreatha Massed, MD.,have documented all relevant documentation on the behalf of Doreatha Massed, MD,as directed by  Doreatha Massed, MD while in the presence of Doreatha Massed, MD.  I, Doreatha Massed MD, have reviewed the above  documentation for accuracy and completeness, and I agree with the above.     Doreatha Massed, MD   4/8/20253:45 PM  CHIEF COMPLAINT:   Diagnosis: cecal cancer    Cancer Staging  Cecal cancer [facility]) Staging form: Colon and Rectum, AJCC 8th Edition - Clinical stage from 11/02/2022: Stage IIA (cT3, cN0, cM0) - Unsigned    Prior Therapy: right hemicolectomy, 10/07/22   Current Therapy: Surveillance   HISTORY OF PRESENT ILLNESS:   Oncology History   No history exists.     INTERVAL HISTORY:   Vincent Griffin is a 63 y.o. male presenting to clinic today for follow up of cecal cancer. He was last seen by me on 06/08/23.  Since his last visit, he was admitted to the hospital from 07/19/23 to 07/20/23 for chest pain. CT chest found: Multiple bilateral pulmonary  nodules are demonstrated throughout the lungs. Largest nodule on the left lies at the base on axial image 300 measuring 1.2 x 1.0 cm. Additional subcentimeter nodules are scattered throughout the left lung. Largest nodule on the right lies within the upper lobe on axial image 84 measuring 7 x 5 mm.   Markanthony has an endovascular stent graft of the aorta, abdominal scheduled for 09/06/23 with Dr. Chestine Spore.   Today, he states that he is doing well overall. His appetite level is at 100%. His energy level is at 40%. He notes normal BM's, and denies any BRBPR or melena. He is taking iron supplements 2 times a week. Burnette received daily vitamin B12 injections for a week and then weekly B12 injections for a month through his PCP. He now receives monthly B12 injections.   PAST MEDICAL HISTORY:   Past Medical History: Past Medical History:  Diagnosis Date   CAD (coronary artery disease)    History of kidney stones    Hypercholesterolemia    Hypertension    Myocardial infarction [facility]) 2022   Peripheral vascular disease (HCC)     Surgical History: Past Surgical History:  Procedure Laterality Date   BIOPSY  09/08/2022   Procedure: BIOPSY;  Surgeon: [name], DO;  [facility];  Service: Endoscopy;;   BIOPSY  04/10/2023   Procedure: BIOPSY;  Surgeon: [name], DO;  [facility];  Service: Endoscopy;;   CARDIAC CATHETERIZATION  07/2020   with stent.   CATARACT EXTRACTION     CATARACT EXTRACTION W/PHACO Left 01/07/2016   Procedure: CATARACT EXTRACTION PHACO AND INTRAOCULAR LENS PLACEMENT LEFT EYE CDE=13.32;  Surgeon: [name], MD;  Location: [facility];  Service: Ophthalmology;  Laterality: Left;   COLONOSCOPY WITH PROPOFOL N/A 09/08/2022   Procedure: COLONOSCOPY WITH PROPOFOL;  Surgeon: [name], DO;  [facility];  Service: Endoscopy;  Laterality: N/A;  11:30 am   COLONOSCOPY WITH PROPOFOL N/A 04/10/2023   Procedure: COLONOSCOPY WITH PROPOFOL;   Surgeon: [name], DO;  [facility];  Service: Endoscopy;  Laterality: N/A;  100pm, asa 3   POLYPECTOMY  09/08/2022   Procedure: POLYPECTOMY;  Surgeon: [name], DO;  [facility];  Service: Endoscopy;;   POLYPECTOMY  04/10/2023   Procedure: POLYPECTOMY;  Surgeon: [name], DO;  [facility];  Service: Endoscopy;;   ROBOT ASSISTED LAPAROSCOPIC PARTIAL COLECTOMY  10/07/2022   SMALL INTESTINE SURGERY  1972   SUBMUCOSAL TATTOO INJECTION  09/08/2022   Procedure: SUBMUCOSAL TATTOO INJECTION;  Surgeon: [name], DO;  [facility];  Service: Endoscopy;;  Social History: Social History   Socioeconomic History   Marital status: Married    Spouse name: Not on file   Number of children: Not on file   Years of education: Not on file   Highest education level: Not on file  Occupational History   Not on file  Tobacco Use   Smoking status: Heavy Smoker    Current packs/day: 1.00    Types: Cigarettes   Smokeless tobacco: Never  Vaping Use   Vaping status: Never Used  Substance and Sexual Activity   Alcohol use: No   Drug use: No   Sexual activity: Not on file  Other Topics Concern   Not on file  Social History Narrative   Not on file   Social Drivers of Health   Financial Resource Strain: Low Risk  (08/12/2020)   Received from [facility], [facility]   Overall Financial Resource Strain (CARDIA)    Difficulty of Paying Living Expenses: Not hard at all  Food Insecurity: No Food Insecurity (10/14/2022)   Hunger Vital Sign    Worried About Running Out of Food in the Last Year: Never true    Ran Out of Food in the Last Year: Never true  Transportation Needs: No Transportation Needs (10/14/2022)   PRAPARE - Administrator, Civil Service (Medical): No    Lack of Transportation (Non-Medical): No  Physical Activity: Not on file  Stress: No Stress Concern Present (08/12/2020)   Received from  [facility], [facility] of Occupational Health - Occupational Stress Questionnaire    Feeling of Stress : Not at all  Social Connections: Unknown (09/20/2021)   Received from [facility], [facility]   Social Network    Social Network: Not on file  Intimate Partner Violence: Not At Risk (07/19/2023)   Received from [facility]   HITS    Over the last 12 months how often did your partner physically hurt you?: Never    Over the last 12 months how often did your partner insult you or talk down to you?: Never    Over the last 12 months how often did your partner threaten you with physical harm?: Never    Over the last 12 months how often did your partner scream or curse at you?: Never    Family History: Family History  Problem Relation Age of Onset   Diabetes Mother    Stroke Mother    Colon polyps Brother    Colon cancer Neg Hx     Current Medications:  Current Outpatient Medications:    albuterol (VENTOLIN HFA) 108 (90 Base) MCG/ACT inhaler, Inhale 2 puffs into the lungs every 6 (six) hours as needed for wheezing or shortness of breath., Disp: 8 g, Rfl: 2   aspirin 81 MG chewable tablet, Chew 81 mg by mouth daily., Disp: , Rfl:    atorvastatin (LIPITOR) 80 MG tablet, Take 80 mg by mouth at bedtime., Disp: , Rfl:    Budeson-Glycopyrrol-Formoterol (BREZTRI AEROSPHERE) 160-9-4.8 MCG/ACT AERO, Inhale 2 puffs into the lungs 2 (two) times daily., Disp: 10.7 g, Rfl: 11   Ferrous Sulfate (IRON PO), Take by mouth. Takes  1 -2 iron tablets per week, Disp: , Rfl:    hydrocortisone (ANUSOL-HC) 2.5 % rectal cream, Place 1 Application rectally 2 (two) times daily., Disp: 30 g, Rfl: 1   metoprolol succinate (TOPROL-XL) 25 MG 24 hr tablet, Take 25 mg by mouth daily., Disp: , Rfl:    nitroGLYCERIN (  NITROSTAT) 0.4 MG SL tablet, Place 0.4 mg under the tongue every 5 (five) minutes as needed for chest pain., Disp: , Rfl:   Current Facility-Administered Medications:     cyanocobalamin (VITAMIN B12) injection 1,000 mcg, 1,000 mcg, Intramuscular, Q30 days, [name] A, FNP, 1,000 mcg at 08/18/23 1553   Allergies: Allergies  Allergen Reactions   Zetia [Ezetimibe] Diarrhea and Nausea Only    Upset stomach    REVIEW OF SYSTEMS:   Review of Systems  Constitutional:  Negative for chills, fatigue and fever.  HENT:   Negative for lump/mass, mouth sores, nosebleeds, sore throat and trouble swallowing.   Eyes:  Negative for eye problems.  Respiratory:  Positive for cough.   Cardiovascular:  Positive for chest pain. Negative for leg swelling and palpitations.  Gastrointestinal:  Negative for abdominal pain, constipation, diarrhea, nausea and vomiting.  Genitourinary:  Negative for bladder incontinence, difficulty urinating, dysuria, frequency, hematuria and nocturia.   Musculoskeletal:  Negative for arthralgias, back pain, flank pain, myalgias and neck pain.  Skin:  Negative for itching and rash.  Neurological:  Positive for headaches. Negative for dizziness and numbness.  Hematological:  Does not bruise/bleed easily.  Psychiatric/Behavioral:  Positive for sleep disturbance. Negative for depression and suicidal ideas. The patient is not nervous/anxious.   All other systems reviewed and are negative.    VITALS:   Blood pressure 113/79, pulse 63, temperature 97.6 F (36.4 C), temperature source Tympanic, resp. rate 20, weight (!) 308 lb 10.3 oz (140 kg), SpO2 97%.  Wt Readings from Last 3 Encounters:  08/22/23 (!) 308 lb 10.3 oz (140 kg)  08/15/23 (!) 312 lb (141.5 kg)  08/04/23 (!) 309 lb (140.2 kg)    Body mass index is 44.29 kg/m.  Performance status (ECOG): 1 - Symptomatic but completely ambulatory  PHYSICAL EXAM:   Physical Exam Vitals and nursing note reviewed. Exam conducted with a chaperone present.  Constitutional:      Appearance: Normal appearance.  Cardiovascular:     Rate and Rhythm: Normal rate and regular rhythm.     Pulses:  Normal pulses.     Heart sounds: Normal heart sounds.  Pulmonary:     Effort: Pulmonary effort is normal.     Breath sounds: Normal breath sounds.  Abdominal:     Palpations: Abdomen is soft. There is no hepatomegaly, splenomegaly or mass.     Tenderness: There is no abdominal tenderness.  Musculoskeletal:     Right lower leg: No edema.     Left lower leg: No edema.  Lymphadenopathy:     Cervical: No cervical adenopathy.     Right cervical: No superficial, deep or posterior cervical adenopathy.    Left cervical: No superficial, deep or posterior cervical adenopathy.     Upper Body:     Right upper body: No supraclavicular or axillary adenopathy.     Left upper body: No supraclavicular or axillary adenopathy.  Neurological:     General: No focal deficit present.     Mental Status: He is alert and oriented to person, place, and time.  Psychiatric:        Mood and Affect: Mood normal.        Behavior: Behavior normal.     LABS:      Latest Ref Rng & Units 08/15/2023   11:42 AM 07/18/2023    3:21 PM 06/01/2023   12:05 PM  CBC  WBC 4.0 - 10.5 K/uL 9.2  8.0  6.8   Hemoglobin 13.0 -  17.0 g/dL 13.2  44.0  10.2   Hematocrit 39.0 - 52.0 % 47.7  43.7  47.3   Platelets 150 - 400 K/uL 195  226  203       Latest Ref Rng & Units 08/15/2023   11:42 AM 07/18/2023    3:21 PM 06/01/2023   12:05 PM  CMP  Glucose 70 - 99 mg/dL 96  87  94   BUN 6 - 20 mg/dL 10  11  10    Creatinine 0.61 - 1.24 mg/dL 7.25  3.66  4.40   Sodium 135 - 145 mmol/L 137  140  138   Potassium 3.5 - 5.1 mmol/L 4.4  4.9  4.7   Chloride 98 - 111 mmol/L 100  101  102   CO2 22 - 32 mmol/L 30  26  30    Calcium 8.9 - 10.3 mg/dL 8.9  9.4  8.8   Total Protein 6.5 - 8.1 g/dL 7.2  6.9  7.3   Total Bilirubin 0.0 - 1.2 mg/dL 0.9  0.5  0.6   Alkaline Phos 38 - 126 U/L 84  107  82   AST 15 - 41 U/L 24  28  24    ALT 0 - 44 U/L 29  25  26       Lab Results  Component Value Date   CEA1 2.4 08/15/2023   /  CEA  Date Value Ref  Range Status  08/15/2023 2.4 0.0 - 4.7 ng/mL Final    Comment:    (NOTE)                             Nonsmokers          <3.9                             Smokers             <5.6 [employer] Electrochemiluminescence Immunoassay (ECLIA) Values obtained with different assay methods or kits cannot be used interchangeably.  Results cannot be interpreted as absolute evidence of the presence or absence of malignant disease. Performed At: [facility] [address] [place], [place] [id number] Jolene Schimke MD [id number]    Lab Results  Component Value Date   PSA1 0.5 06/05/2020   No results found for: "OAC166" No results found for: "CAN125"  No results found for: "TOTALPROTELP", "ALBUMINELP", "A1GS", "A2GS", "BETS", "BETA2SER", "GAMS", "MSPIKE", "SPEI" Lab Results  Component Value Date   TIBC 330 07/18/2023   TIBC 371 06/01/2023   TIBC 352 11/03/2022   FERRITIN 106 07/18/2023   FERRITIN 43 06/01/2023   FERRITIN 50 11/03/2022   IRONPCTSAT 34 07/18/2023   IRONPCTSAT 37 06/01/2023   IRONPCTSAT 32 11/03/2022   No results found for: "LDH"   STUDIES:   No results found.

## 2023-08-22 NOTE — Patient Instructions (Signed)
 [facility] at [facility] Discharge Instructions   You were seen and examined today by Dr. Ellin Saba.  He reviewed the results of your lab work which are normal/stable.   We will see you back in 3 months. We will repeat lab work and a CT scan prior to this visit.  Return as scheduled.    Thank you for choosing [facility] at [facility] to provide your oncology and hematology care.  To afford each patient quality time with our provider, please arrive at least 15 minutes before your scheduled appointment time.   If you have a lab appointment with the Cancer Center please come in thru the Main Entrance and check in at the main information desk.  You need to re-schedule your appointment should you arrive 10 or more minutes late.  We strive to give you quality time with our providers, and arriving late affects you and other patients whose appointments are after yours.  Also, if you no show three or more times for appointments you may be dismissed from the clinic at the providers discretion.     Again, thank you for choosing [facility].  Our hope is that these requests will decrease the amount of time that you wait before being seen by our physicians.       _____________________________________________________________  Should you have questions after your visit to [facility], please contact our office at [phone] and follow the prompts.  Our office hours are 8:00 a.m. and 4:30 p.m. Monday - Friday.  Please note that voicemails left after 4:00 p.m. may not be returned until the following business day.  We are closed weekends and major holidays.  You do have access to a nurse 24-7, just call the main number to the clinic [phone] and do not press any options, hold on the line and a nurse will answer the phone.    For prescription refill requests, have your pharmacy contact our office and allow 72 hours.    Due to  Covid, you will need to wear a mask upon entering the hospital. If you do not have a mask, a mask will be given to you at the Main Entrance upon arrival. For doctor visits, patients may have 1 support person age 9 or older with them. For treatment visits, patients can not have anyone with them due to social distancing guidelines and our immunocompromised population.

## 2023-08-22 NOTE — Progress Notes (Signed)
 Surgical Instructions   Your procedure is scheduled on September 06, 2023. Report to [facility] Main Entrance "A" at 8:00 A.M., then check in with the Admitting office. Any questions or running late day of surgery: call [phone]  Questions prior to your surgery date: call [phone], Monday-Friday, 8am-4pm. If you experience any cold or flu symptoms such as cough, fever, chills, shortness of breath, etc. between now and your scheduled surgery, please notify us at the above number.     Remember:  Do not eat or drink after midnight the night before your surgery       Take these medicines the morning of surgery with A SIP OF WATER  aspirin  metoprolol succinate (TOPROL-XL)   May take these medicines IF NEEDED: albuterol (VENTOLIN HFA) inhaler MAY BRING WITH YOU nitroGLYCERIN (NITROSTAT) Please call [phone] if used prior to surgery  One week prior to surgery, STOP taking any (unless otherwise instructed by your surgeon) Aleve, Naproxen, Ibuprofen, Motrin, Advil, Goody's, BC's, all herbal medications, fish oil, and non-prescription vitamins.                     Do NOT Smoke (Tobacco/Vaping) for 24 hours prior to your procedure.  If you use a CPAP at night, you may bring your mask/headgear for your overnight stay.   You will be asked to remove any contacts, glasses, piercing's, hearing aid's, dentures/partials prior to surgery. Please bring cases for these items if needed.    Patients discharged the day of surgery will not be allowed to drive home, and someone needs to stay with them for 24 hours.  SURGICAL WAITING ROOM VISITATION Patients may have no more than 2 support people in the waiting area - these visitors may rotate.   Pre-op nurse will coordinate an appropriate time for 1 ADULT support person, who may not rotate, to accompany patient in pre-op.  Children under the age of 45 must have an adult with them who is not the patient and must remain in the main waiting area  with an adult.  If the patient needs to stay at the hospital during part of their recovery, the visitor guidelines for inpatient rooms apply.  Please refer to the [facility] website for the visitor guidelines for any additional information.   If you received a COVID test during your pre-op visit  it is requested that you wear a mask when out in public, stay away from anyone that may not be feeling well and notify your surgeon if you develop symptoms. If you have been in contact with anyone that has tested positive in the last 10 days please notify you surgeon.      Pre-operative CHG Bathing Instructions   You can play a key role in reducing the risk of infection after surgery. Your skin needs to be as free of germs as possible. You can reduce the number of germs on your skin by washing with CHG (chlorhexidine gluconate) soap before surgery. CHG is an antiseptic soap that kills germs and continues to kill germs even after washing.   DO NOT use if you have an allergy to chlorhexidine/CHG or antibacterial soaps. If your skin becomes reddened or irritated, stop using the CHG and notify one of our RNs at [phone].              TAKE A SHOWER THE NIGHT BEFORE SURGERY AND THE DAY OF SURGERY    Please keep in mind the following:  DO NOT shave, including  legs and underarms, 48 hours prior to surgery.   You may shave your face before/day of surgery.  Place clean sheets on your bed the night before surgery Use a clean washcloth (not used since being washed) for each shower. DO NOT sleep with pet's night before surgery.  CHG Shower Instructions:  Wash your face and private area with normal soap. If you choose to wash your hair, wash first with your normal shampoo.  After you use shampoo/soap, rinse your hair and body thoroughly to remove shampoo/soap residue.  Turn the water OFF and apply half the bottle of CHG soap to a CLEAN washcloth.  Apply CHG soap ONLY FROM YOUR NECK DOWN TO YOUR TOES  (washing for 3-5 minutes)  DO NOT use CHG soap on face, private areas, open wounds, or sores.  Pay special attention to the area where your surgery is being performed.  If you are having back surgery, having someone wash your back for you may be helpful. Wait 2 minutes after CHG soap is applied, then you may rinse off the CHG soap.  Pat dry with a clean towel  Put on clean pajamas    Additional instructions for the day of surgery: DO NOT APPLY any lotions, deodorants, cologne, or perfumes.   Do not wear jewelry or makeup Do not wear nail polish, gel polish, artificial nails, or any other type of covering on natural nails (fingers and toes) Do not bring valuables to the hospital. [facility] is not responsible for valuables/personal belongings. Put on clean/comfortable clothes.  Please brush your teeth.  Ask your nurse before applying any prescription medications to the skin.

## 2023-08-23 ENCOUNTER — Other Ambulatory Visit: Payer: Self-pay

## 2023-08-23 ENCOUNTER — Encounter (HOSPITAL_COMMUNITY): Payer: Self-pay

## 2023-08-23 ENCOUNTER — Encounter (HOSPITAL_COMMUNITY)
Admission: RE | Admit: 2023-08-23 | Discharge: 2023-08-23 | Disposition: A | Discharge status: Home / Self Care | Source: Ambulatory Visit | Attending: Vascular Surgery | Admitting: Vascular Surgery

## 2023-08-23 VITALS — BP 129/80 | HR 64 | Temp 97.6°F | Resp 19 | Ht 70.0 in | Wt 311.7 lb

## 2023-08-23 DIAGNOSIS — I2510 Atherosclerotic heart disease of native coronary artery without angina pectoris: Secondary | ICD-10-CM | POA: Diagnosis not present

## 2023-08-23 DIAGNOSIS — N200 Calculus of kidney: Secondary | ICD-10-CM | POA: Diagnosis not present

## 2023-08-23 DIAGNOSIS — K449 Diaphragmatic hernia without obstruction or gangrene: Secondary | ICD-10-CM | POA: Diagnosis not present

## 2023-08-23 DIAGNOSIS — I723 Aneurysm of iliac artery: Secondary | ICD-10-CM | POA: Diagnosis not present

## 2023-08-23 DIAGNOSIS — I700 Atherosclerosis of aorta: Secondary | ICD-10-CM | POA: Insufficient documentation

## 2023-08-23 DIAGNOSIS — Z87442 Personal history of urinary calculi: Secondary | ICD-10-CM | POA: Insufficient documentation

## 2023-08-23 DIAGNOSIS — R9431 Abnormal electrocardiogram [ECG] [EKG]: Secondary | ICD-10-CM | POA: Diagnosis not present

## 2023-08-23 DIAGNOSIS — Z955 Presence of coronary angioplasty implant and graft: Secondary | ICD-10-CM | POA: Diagnosis not present

## 2023-08-23 DIAGNOSIS — Z01818 Encounter for other preprocedural examination: Secondary | ICD-10-CM | POA: Insufficient documentation

## 2023-08-23 DIAGNOSIS — I252 Old myocardial infarction: Secondary | ICD-10-CM | POA: Diagnosis not present

## 2023-08-23 DIAGNOSIS — J439 Emphysema, unspecified: Secondary | ICD-10-CM | POA: Diagnosis not present

## 2023-08-23 HISTORY — DX: Malignant (primary) neoplasm, unspecified: C80.1

## 2023-08-23 HISTORY — DX: Aneurysm of iliac artery: I72.3

## 2023-08-23 LAB — URINALYSIS, ROUTINE W REFLEX MICROSCOPIC
Bilirubin Urine: NEGATIVE
Glucose, UA: NEGATIVE mg/dL
Hgb urine dipstick: NEGATIVE
Ketones, ur: NEGATIVE mg/dL
Leukocytes,Ua: NEGATIVE
Nitrite: NEGATIVE
Protein, ur: NEGATIVE mg/dL
Specific Gravity, Urine: 1.017 (ref 1.005–1.030)
pH: 5 (ref 5.0–8.0)

## 2023-08-23 LAB — TYPE AND SCREEN
ABO/RH(D): O POS
Antibody Screen: NEGATIVE

## 2023-08-23 LAB — SURGICAL PCR SCREEN
MRSA, PCR: NEGATIVE
Staphylococcus aureus: NEGATIVE

## 2023-08-23 LAB — APTT: aPTT: 32 s (ref 24–36)

## 2023-08-23 NOTE — Progress Notes (Signed)
 PCP - [name] FNP Cardiologist - Liana Crocker [facility]  PPM/ICD - denies Device Orders -  Rep Notified -   Chest x-ray - 07/19/23 EKG - 07/19/23 requested Stress Test - 07/20/23 ECHO - 07/19/23 Cardiac Cath - 08/12/2020  Sleep Study - pt says his PCP has referred him for a sleep study and he is supposed to call and schedule an appointment.  CPAP - no  Fasting Blood Sugar - na Checks Blood Sugar _____ times a day  Last dose of GLP1 agonist-  na GLP1 instructions:   Blood Thinner Instructions:na Aspirin Instructions:per Dr. Chestine Spore  ERAS Protcol -no  PRE-SURGERY Ensure or G2-   COVID TEST- na   Anesthesia review: yes- cardiac history. Pt seen in PCP office 08/04/23 for head and chest congestion,yellow sputum.Treated with 5 day course of Zithromax and Prednisone. Pt denies having symptoms since finishing medications which he reports starting on 3/21.  Patient denies shortness of breath, fever, cough and chest pain at PAT appointment   All instructions explained to the patient, with a verbal understanding of the material. Patient agrees to go over the instructions while at home for a better understanding. Patient also instructed to wear a mask when out in public prior to surgery. The opportunity to ask questions was provided.

## 2023-08-23 NOTE — Progress Notes (Addendum)
 Surgical Instructions   Your procedure is scheduled on September 06, 2023. Report to [facility] Main Entrance "A" at 8:00 A.M., then check in with the Admitting office. Any questions or running late day of surgery: call [phone]  Questions prior to your surgery date: call [phone], Monday-Friday, 8am-4pm. If you experience any cold or flu symptoms such as cough, fever, chills, shortness of breath, etc. between now and your scheduled surgery, please notify us at the above number.     Remember:  Do not eat or drink after midnight the night before your surgery    Take these medicines the morning of surgery with A SIP OF WATER  aspirin  Budeson-Glycopyrrol-Formoterol (BREZTRI AEROSPHERE)  metoprolol succinate (TOPROL-XL)   May take these medicines IF NEEDED: albuterol (VENTOLIN HFA) inhaler MAY BRING WITH YOU nitroGLYCERIN (NITROSTAT) Please call [phone] if used prior to surgery  One week prior to surgery, STOP taking any  Aleve, Naproxen, Ibuprofen, Motrin, Advil, Goody's, BC's, all herbal medications, fish oil, and non-prescription vitamins.                     Do NOT Smoke (Tobacco/Vaping) for 24 hours prior to your procedure.  If you use a CPAP at night, you may bring your mask/headgear for your overnight stay.   You will be asked to remove any contacts, glasses, piercing's, hearing aid's, dentures/partials prior to surgery. Please bring cases for these items if needed.    Patients discharged the day of surgery will not be allowed to drive home, and someone needs to stay with them for 24 hours.  SURGICAL WAITING ROOM VISITATION Patients may have no more than 2 support people in the waiting area - these visitors may rotate.   Pre-op nurse will coordinate an appropriate time for 1 ADULT support person, who may not rotate, to accompany patient in pre-op.  Children under the age of 54 must have an adult with them who is not the patient and must remain in the main waiting  area with an adult.  If the patient needs to stay at the hospital during part of their recovery, the visitor guidelines for inpatient rooms apply.  Please refer to the [facility] website for the visitor guidelines for any additional information.   If you received a COVID test during your pre-op visit  it is requested that you wear a mask when out in public, stay away from anyone that may not be feeling well and notify your surgeon if you develop symptoms. If you have been in contact with anyone that has tested positive in the last 10 days please notify you surgeon.      Pre-operative CHG Bathing Instructions   You can play a key role in reducing the risk of infection after surgery. Your skin needs to be as free of germs as possible. You can reduce the number of germs on your skin by washing with CHG (chlorhexidine gluconate) soap before surgery. CHG is an antiseptic soap that kills germs and continues to kill germs even after washing.   DO NOT use if you have an allergy to chlorhexidine/CHG or antibacterial soaps. If your skin becomes reddened or irritated, stop using the CHG and notify one of our RNs at [phone].              TAKE A SHOWER THE NIGHT BEFORE SURGERY AND THE DAY OF SURGERY    Please keep in mind the following:  DO NOT shave, including legs and underarms, 48  hours prior to surgery.   You may shave your face before/day of surgery.  Place clean sheets on your bed the night before surgery Use a clean washcloth (not used since being washed) for each shower. DO NOT sleep with pet's night before surgery.  CHG Shower Instructions:  Wash your face and private area with normal soap. If you choose to wash your hair, wash first with your normal shampoo.  After you use shampoo/soap, rinse your hair and body thoroughly to remove shampoo/soap residue.  Turn the water OFF and apply half the bottle of CHG soap to a CLEAN washcloth.  Apply CHG soap ONLY FROM YOUR NECK DOWN TO YOUR  TOES (washing for 3-5 minutes)  DO NOT use CHG soap on face, private areas, open wounds, or sores.  Pay special attention to the area where your surgery is being performed.  If you are having back surgery, having someone wash your back for you may be helpful. Wait 2 minutes after CHG soap is applied, then you may rinse off the CHG soap.  Pat dry with a clean towel  Put on clean pajamas    Additional instructions for the day of surgery: DO NOT APPLY any lotions, deodorants, cologne, or perfumes.   Do not wear jewelry or makeup Do not wear nail polish, gel polish, artificial nails, or any other type of covering on natural nails (fingers and toes) Do not bring valuables to the hospital. [facility] is not responsible for valuables/personal belongings. Put on clean/comfortable clothes.  Please brush your teeth.  Ask your nurse before applying any prescription medications to the skin.

## 2023-08-23 NOTE — Progress Notes (Signed)
 Secure chat with Caprice Beaver regarding pre op labs

## 2023-08-24 ENCOUNTER — Encounter (HOSPITAL_COMMUNITY): Payer: Self-pay

## 2023-08-24 NOTE — Progress Notes (Signed)
 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   VS: BP 129/80   Pulse 64   Temp 36.4 C   Resp 19   Ht 5\' 10"  (1.778 m)   Wt (!) 141.4 kg   SpO2 97%   BMI 44.72 kg/m   PROVIDERS: Junie Spencer, FNP is PCP Means, Earl Lites, MD is cardiologist Smitty Cords) Doreatha Massed, MD is HEM-ONC   LABS: Most recent labs in [facility] include: Lab Results  Component Value Date   WBC 9.2 08/15/2023   HGB 15.2 08/15/2023   HCT 47.7 08/15/2023   PLT 195 08/15/2023   GLUCOSE 96 08/15/2023   CHOL 115 07/18/2023   TRIG 108 07/18/2023   HDL 35 (L) 07/18/2023   LDLCALC 60 07/18/2023   ALT 29 08/15/2023   AST 24 08/15/2023   NA 137 08/15/2023   K 4.4 08/15/2023   CL 100 08/15/2023   CREATININE 0.96 08/15/2023   BUN 10 08/15/2023   CO2 30 08/15/2023   TSH 1.680 07/18/2023   INR 1.0 06/01/2021  UA normal. T&S done 08/23/23.    IMAGES: CTA Chest 07/19/23 ([facility]): IMPRESSION:  1. No evidence of pulmonary embolus.  2. Multiple nonspecific bilateral pulmonary nodules as above raising suspicion for possible metastatic disease.  3. Nonobstructive left renal stones.   CT Chest/abd/pelvis 06/01/23: IMPRESSION: 1. Status post partial right hemicolectomy and ileocolic anastomosis. 2. No evidence of recurrent or metastatic disease in the chest, abdomen, or pelvis. 3. Multiple small bilateral pulmonary  nodules, unchanged, nonspecific and most likely benign sequelae of infection or inflammation. Continued attention on follow-up. 4. Unchanged soft tissue attenuation left adrenal nodule, which remains most likely a benign incidental adenoma. Attention on follow-up. 5. Nonobstructive bilateral nephrolithiasis. 6. Emphysema and diffuse bilateral bronchial wall thickening. 7. Coronary artery disease. 8. 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 Recommend referral to or continued care with vascular specialist if clinically appropriate. (Ref.: J Vasc Surg. 2018; 67:2-77 and J Am Coll Radiol 2013;10(10):789-794.) - Aortic Atherosclerosis (ICD10-I70.0) and Emphysema (ICD10-J43.9).  PET Scan 11/24/22: IMPRESSION: 1. Interval right hemicolectomy. No findings of tracer avid recurrent or metastatic disease. 2. Mild limitations secondary to patient body habitus. 3. Small bilateral pulmonary nodules are below PET resolution and relatively similar to 09/13/2022. Consider chest CT follow-up at 3 months to confirm stability. 4. Left adrenal nodule is unchanged and is felt to represent a lipid poor adenoma on prior contrast-enhanced CT. No specific follow-up indicated. 5. Incidental findings, including: Aortic atherosclerosis (ICD10-I70.0), coronary artery atherosclerosis and emphysema (ICD10-J43.9). Small hiatal hernia. Bilateral nephrolithiasis. Right common iliac artery aneurysm.    EKG: EKG 10/05/22: Normal sinus rhythm Right bundle branch block Possible Inferior infarct , age undetermined Abnormal ECG When compared with ECG of 06-Sep-2022 08:51, rate is slightly slower Confirmed by Rosemary Holms, Manish ([id number]) on 10/05/2022 12:45:20 PM   CV: Nuclear stress test 07/20/23 ([facility]): IMPRESSION:  1. No evidence of inducible ischemia.  2. Normal left ventricular function and wall motion.  3. Normal vasodilator stress ecg.  4. Overall, low risk  study.   Echo 07/20/23 ([facility]): IMPRESSION: Left Ventricle: Left ventricle is mildly dilated.    Left Ventricle: Systolic function is normal. EF: 60-65%.    Left Ventricle: Doppler parameters consistent with mild diastolic  dysfunction and low to normal LA pressure.    Left Atrium: Left atrium is mildly dilated at 4.500 cm.   BLE Venous US 07/20/23 ([facility]): MPRESSION:  No evidence of deep venous thrombosis.    Cardiac cath 08/12/20 ([facility]): Coronary Angiography  - Dominance: Right  - Left main: Large-caliber vessel without disease  - Left anterior descending: Large caliber vessel with mild non obstructive  plaque  - Circumflex: Medium in size, normal  - Right Coronary: Large vessel with 75% prox/mid hazy stenosis and 30-40%  ostial stenosis.  Large distal vessel  - S/p DES prox-mid RCA   Past Medical History:  Diagnosis Date   CAD (coronary artery disease)    Cancer (HCC)    colon   History of kidney stones    Hypercholesterolemia    Hypertension    Myocardial infarction [facility]) 2022   Peripheral vascular disease (HCC)     Past Surgical History:  Procedure Laterality Date   APPENDECTOMY     BIOPSY  09/08/2022   Procedure: BIOPSY;  Surgeon: [name], DO;  Location: [facility];  Service: Endoscopy;;   BIOPSY  04/10/2023   Procedure: BIOPSY;  Surgeon: [name], DO;  Location: [facility];  Service: Endoscopy;;   CARDIAC CATHETERIZATION  07/2020   with stent.   CATARACT EXTRACTION     CATARACT EXTRACTION W/PHACO Left 01/07/2016   Procedure: CATARACT EXTRACTION PHACO AND INTRAOCULAR LENS PLACEMENT LEFT EYE CDE=13.32;  Surgeon: [name], MD;  Location: [facility] ORS;  Service: Ophthalmology;  Laterality: Left;   COLONOSCOPY WITH PROPOFOL N/A 09/08/2022   Procedure: COLONOSCOPY WITH PROPOFOL;  Surgeon: [name], DO;  Location: [facility];  Service: Endoscopy;  Laterality: N/A;  11:30 am   COLONOSCOPY WITH PROPOFOL N/A 04/10/2023    Procedure: COLONOSCOPY WITH PROPOFOL;  Surgeon: [name], DO;  [facility];  Service: Endoscopy;  Laterality: N/A;  100pm, asa 3   POLYPECTOMY  09/08/2022   Procedure: POLYPECTOMY;  Surgeon: [name], DO;  [facility];  Service: Endoscopy;;   POLYPECTOMY  04/10/2023   Procedure: POLYPECTOMY;  Surgeon: [name], DO;  [facility];  Service: Endoscopy;;   ROBOT ASSISTED LAPAROSCOPIC PARTIAL COLECTOMY  10/07/2022   SMALL INTESTINE SURGERY  1972   SUBMUCOSAL TATTOO INJECTION  09/08/2022   Procedure: SUBMUCOSAL TATTOO INJECTION;  Surgeon: [name], DO;  [facility]: [facility];  Service: Endoscopy;;    MEDICATIONS:  albuterol (VENTOLIN HFA) 108 (90 Base) MCG/ACT inhaler   aspirin 81 MG chewable tablet   atorvastatin (LIPITOR) 80 MG tablet   Budeson-Glycopyrrol-Formoterol (BREZTRI AEROSPHERE) 160-9-4.8 MCG/ACT AERO   Ferrous Sulfate (IRON PO)   hydrocortisone (ANUSOL-HC) 2.5 % rectal cream   metoprolol succinate (TOPROL-XL) 25 MG 24 hr tablet   nitroGLYCERIN (NITROSTAT) 0.4 MG SL tablet    cyanocobalamin (VITAMIN B12) injection 1,000 mcg

## 2023-08-25 ENCOUNTER — Encounter (HOSPITAL_COMMUNITY): Payer: Self-pay

## 2023-08-25 NOTE — Anesthesia Preprocedure Evaluation (Addendum)
 Anesthesia Evaluation  Patient identified by MRN, date of birth, ID band Patient awake    Reviewed: Allergy & Precautions, NPO status , Patient's Chart, lab work & pertinent test results  Airway Mallampati: II  TM Distance: >3 FB Neck ROM: Full    Dental no notable dental hx.    Pulmonary COPD,  COPD inhaler, Current SmokerPatient did not abstain from smoking.   Pulmonary exam normal        Cardiovascular hypertension, Pt. on home beta blockers + CAD, + Past MI, + Cardiac Stents (x 1) and + Peripheral Vascular Disease  Normal cardiovascular exam  Nuclear stress test 07/20/23 ([facility]): IMPRESSION:  1. No evidence of inducible ischemia.  2. Normal left ventricular function and wall motion.  3. Normal vasodilator stress ecg.  4. Overall, low risk study.    Echo 07/20/23 ([facility]): IMPRESSION: LeftVentricle: Left ventricle is mildly dilated.   LeftVentricle: Systolic function is normal. EF: 60-65%.   LeftVentricle: Doppler parameters consistent with mild diastolic  dysfunction and low to normal LA pressure.   LeftAtrium: Left atriumis mildly dilated at 4.500 cm.      Neuro/Psych negative neurological ROS  negative psych ROS   GI/Hepatic negative GI ROS, Neg liver ROS,,,  Endo/Other    Class 3 obesity  Renal/GU negative Renal ROS     Musculoskeletal negative musculoskeletal ROS (+)    Abdominal  (+) + obese  Peds  Hematology negative hematology ROS (+)   Anesthesia Other Findings Aneurysm of iliac artery  Reproductive/Obstetrics                             Anesthesia Physical Anesthesia Plan  ASA: 3  Anesthesia Plan: General   Post-op Pain Management:    Induction: Intravenous  PONV Risk Score and Plan: 1 and Ondansetron , Dexamethasone , Midazolam  and Treatment may vary due to age or medical condition  Airway Management Planned: Oral ETT  Additional Equipment:  Arterial line  Intra-op Plan:   Post-operative Plan: Extubation in OR  Informed Consent: I have reviewed the patients History and Physical, chart, labs and discussed the procedure including the risks, benefits and alternatives for the proposed anesthesia with the patient or authorized representative who has indicated his/her understanding and acceptance.     Dental advisory given  Plan Discussed with: CRNA  Anesthesia Plan Comments: (PAT note written 08/25/2023 by Allison Zelenak, PA-C.  )       Anesthesia Quick Evaluation

## 2023-08-26 LAB — SIGNATERA
SIGNATERA MTM READOUT: 0 MTM/ml
SIGNATERA TEST RESULT: NEGATIVE

## 2023-09-07 ENCOUNTER — Inpatient Hospital Stay: Payer: BC Managed Care – PPO

## 2023-09-07 NOTE — Progress Notes (Signed)
 Pt called and given new date and time of surgery. He verbalized understanding and stated he would follow the printed instructions he was given at his PAT appointment.

## 2023-09-08 ENCOUNTER — Inpatient Hospital Stay (HOSPITAL_COMMUNITY)

## 2023-09-08 ENCOUNTER — Inpatient Hospital Stay (HOSPITAL_COMMUNITY)
Admission: RE | Admit: 2023-09-08 | Discharge: 2023-09-09 | DRG: 271 | Disposition: A | Discharge status: Home / Self Care | Attending: Vascular Surgery | Admitting: Vascular Surgery

## 2023-09-08 ENCOUNTER — Other Ambulatory Visit: Payer: Self-pay

## 2023-09-08 ENCOUNTER — Inpatient Hospital Stay (HOSPITAL_COMMUNITY): Payer: Self-pay | Admitting: Vascular Surgery

## 2023-09-08 ENCOUNTER — Inpatient Hospital Stay (HOSPITAL_COMMUNITY): Payer: Self-pay

## 2023-09-08 ENCOUNTER — Encounter (HOSPITAL_COMMUNITY): Payer: Self-pay | Admitting: Vascular Surgery

## 2023-09-08 ENCOUNTER — Encounter (HOSPITAL_COMMUNITY)
Admission: RE | Disposition: A | Discharge status: Home / Self Care | Payer: Self-pay | Source: Home / Self Care | Attending: Vascular Surgery

## 2023-09-08 DIAGNOSIS — Z6841 Body Mass Index (BMI) 40.0 and over, adult: Secondary | ICD-10-CM

## 2023-09-08 DIAGNOSIS — Z888 Allergy status to other drugs, medicaments and biological substances status: Secondary | ICD-10-CM

## 2023-09-08 DIAGNOSIS — E7800 Pure hypercholesterolemia, unspecified: Secondary | ICD-10-CM | POA: Diagnosis not present

## 2023-09-08 DIAGNOSIS — Z79899 Other long term (current) drug therapy: Secondary | ICD-10-CM | POA: Diagnosis not present

## 2023-09-08 DIAGNOSIS — I739 Peripheral vascular disease, unspecified: Secondary | ICD-10-CM | POA: Diagnosis present

## 2023-09-08 DIAGNOSIS — Z83719 Family history of colon polyps, unspecified: Secondary | ICD-10-CM | POA: Diagnosis not present

## 2023-09-08 DIAGNOSIS — Z87442 Personal history of urinary calculi: Secondary | ICD-10-CM

## 2023-09-08 DIAGNOSIS — E66813 Obesity, class 3: Secondary | ICD-10-CM | POA: Diagnosis not present

## 2023-09-08 DIAGNOSIS — Z9049 Acquired absence of other specified parts of digestive tract: Secondary | ICD-10-CM

## 2023-09-08 DIAGNOSIS — D62 Acute posthemorrhagic anemia: Secondary | ICD-10-CM | POA: Diagnosis not present

## 2023-09-08 DIAGNOSIS — R918 Other nonspecific abnormal finding of lung field: Secondary | ICD-10-CM | POA: Diagnosis not present

## 2023-09-08 DIAGNOSIS — Z7982 Long term (current) use of aspirin: Secondary | ICD-10-CM

## 2023-09-08 DIAGNOSIS — I10 Essential (primary) hypertension: Secondary | ICD-10-CM | POA: Diagnosis present

## 2023-09-08 DIAGNOSIS — Z7951 Long term (current) use of inhaled steroids: Secondary | ICD-10-CM | POA: Diagnosis not present

## 2023-09-08 DIAGNOSIS — I252 Old myocardial infarction: Secondary | ICD-10-CM | POA: Diagnosis not present

## 2023-09-08 DIAGNOSIS — J449 Chronic obstructive pulmonary disease, unspecified: Secondary | ICD-10-CM | POA: Diagnosis present

## 2023-09-08 DIAGNOSIS — I2510 Atherosclerotic heart disease of native coronary artery without angina pectoris: Secondary | ICD-10-CM | POA: Diagnosis not present

## 2023-09-08 DIAGNOSIS — Z833 Family history of diabetes mellitus: Secondary | ICD-10-CM

## 2023-09-08 DIAGNOSIS — Z823 Family history of stroke: Secondary | ICD-10-CM

## 2023-09-08 DIAGNOSIS — Z85038 Personal history of other malignant neoplasm of large intestine: Secondary | ICD-10-CM | POA: Diagnosis not present

## 2023-09-08 DIAGNOSIS — I723 Aneurysm of iliac artery: Secondary | ICD-10-CM | POA: Diagnosis not present

## 2023-09-08 DIAGNOSIS — F17210 Nicotine dependence, cigarettes, uncomplicated: Secondary | ICD-10-CM | POA: Diagnosis not present

## 2023-09-08 HISTORY — PX: ABDOMINAL AORTIC ENDOVASCULAR STENT GRAFT: SHX5707

## 2023-09-08 HISTORY — PX: ANEURYSM COILING: SHX5349

## 2023-09-08 LAB — BASIC METABOLIC PANEL WITH GFR
Anion gap: 7 (ref 5–15)
BUN: 13 mg/dL (ref 6–20)
CO2: 25 mmol/L (ref 22–32)
Calcium: 8.4 mg/dL — ABNORMAL LOW (ref 8.9–10.3)
Chloride: 104 mmol/L (ref 98–111)
Creatinine, Ser: 1.03 mg/dL (ref 0.61–1.24)
GFR, Estimated: >60 mL/min (ref >60)
Glucose, Bld: 118 mg/dL — ABNORMAL HIGH (ref 70–99)
Potassium: 4.7 mmol/L (ref 3.5–5.1)
Sodium: 136 mmol/L (ref 135–145)

## 2023-09-08 LAB — MAGNESIUM: Magnesium: 1.9 mg/dL (ref 1.7–2.4)

## 2023-09-08 LAB — TYPE AND SCREEN
ABO/RH(D): O POS
Antibody Screen: NEGATIVE

## 2023-09-08 LAB — CBC
HCT: 42.7 % (ref 39.0–52.0)
Hemoglobin: 13.6 g/dL (ref 13.0–17.0)
MCH: 32.7 pg (ref 26.0–34.0)
MCHC: 31.9 g/dL (ref 30.0–36.0)
MCV: 102.6 fL — ABNORMAL HIGH (ref 80.0–100.0)
Platelets: 172 10*3/uL (ref 150–400)
RBC: 4.16 MIL/uL — ABNORMAL LOW (ref 4.22–5.81)
RDW: 13.5 % (ref 11.5–15.5)
WBC: 7.8 10*3/uL (ref 4.0–10.5)
nRBC: 0 % (ref 0.0–0.2)

## 2023-09-08 LAB — APTT: aPTT: 41 s — ABNORMAL HIGH (ref 24–36)

## 2023-09-08 LAB — PROTIME-INR
INR: 1.2 (ref 0.8–1.2)
Prothrombin Time: 15.4 s — ABNORMAL HIGH (ref 11.4–15.2)

## 2023-09-08 SURGERY — INSERTION, ENDOVASCULAR STENT GRAFT, AORTA, ABDOMINAL
Anesthesia: General | Site: Abdomen

## 2023-09-08 MED ORDER — PROPOFOL 10 MG/ML IV BOLUS
INTRAVENOUS | Status: DC | PRN
  Administered 2023-09-08: 150 mg via INTRAVENOUS
  Administered 2023-09-08: 50 mg via INTRAVENOUS

## 2023-09-08 MED ORDER — IODIXANOL 320 MG/ML IV SOLN
INTRAVENOUS | Status: DC | PRN
  Administered 2023-09-08: 150 mL via INTRA_ARTERIAL

## 2023-09-08 MED ORDER — AMISULPRIDE (ANTIEMETIC) 5 MG/2ML IV SOLN: 10.0000 mg | Freq: Once | INTRAVENOUS | Status: DC | PRN

## 2023-09-08 MED ORDER — SODIUM CHLORIDE 0.9 % IV SOLN: INTRAVENOUS | Status: DC

## 2023-09-08 MED ORDER — PHENYLEPHRINE 80 MCG/ML (10ML) SYRINGE FOR IV PUSH (FOR BLOOD PRESSURE SUPPORT)
PREFILLED_SYRINGE | INTRAVENOUS | Status: AC
  Filled 2023-09-08: qty 20

## 2023-09-08 MED ORDER — OXYCODONE HCL 5 MG PO TABS: 5.0000 mg | ORAL_TABLET | Freq: Once | ORAL | Status: DC | PRN

## 2023-09-08 MED ORDER — CEFAZOLIN SODIUM-DEXTROSE 2-4 GM/100ML-% IV SOLN
2.0000 g | Freq: Three times a day (TID) | INTRAVENOUS | Status: AC
  Administered 2023-09-08 (×2): 2 g via INTRAVENOUS
  Filled 2023-09-08 (×2): qty 100

## 2023-09-08 MED ORDER — MIDAZOLAM HCL 2 MG/2ML IJ SOLN
INTRAMUSCULAR | Status: AC
  Filled 2023-09-08: qty 2

## 2023-09-08 MED ORDER — ALBUMIN HUMAN 5 % IV SOLN: INTRAVENOUS | Status: DC | PRN

## 2023-09-08 MED ORDER — PROPOFOL 1000 MG/100ML IV EMUL
INTRAVENOUS | Status: AC
Start: 2023-09-08 — End: ?
  Filled 2023-09-08: qty 100

## 2023-09-08 MED ORDER — CHLORHEXIDINE GLUCONATE 0.12 % MT SOLN
15.0000 mL | Freq: Once | OROMUCOSAL | Status: AC
  Administered 2023-09-08: 15 mL via OROMUCOSAL
  Filled 2023-09-08: qty 15

## 2023-09-08 MED ORDER — CHLORHEXIDINE GLUCONATE CLOTH 2 % EX PADS: 6.0000 | MEDICATED_PAD | Freq: Once | CUTANEOUS | Status: DC

## 2023-09-08 MED ORDER — DEXMEDETOMIDINE HCL IN NACL 80 MCG/20ML IV SOLN
INTRAVENOUS | Status: DC | PRN
  Administered 2023-09-08: 20 ug via INTRAVENOUS

## 2023-09-08 MED ORDER — FENTANYL CITRATE (PF) 250 MCG/5ML IJ SOLN
INTRAMUSCULAR | Status: DC | PRN
  Administered 2023-09-08 (×3): 50 ug via INTRAVENOUS

## 2023-09-08 MED ORDER — PHENYLEPHRINE 80 MCG/ML (10ML) SYRINGE FOR IV PUSH (FOR BLOOD PRESSURE SUPPORT)
PREFILLED_SYRINGE | INTRAVENOUS | Status: DC | PRN
  Administered 2023-09-08: 80 ug via INTRAVENOUS
  Administered 2023-09-08: 240 ug via INTRAVENOUS

## 2023-09-08 MED ORDER — LACTATED RINGERS IV SOLN: INTRAVENOUS | Status: DC | PRN

## 2023-09-08 MED ORDER — ALBUTEROL SULFATE HFA 108 (90 BASE) MCG/ACT IN AERS: 2.0000 | INHALATION_SPRAY | Freq: Four times a day (QID) | RESPIRATORY_TRACT | Status: DC | PRN

## 2023-09-08 MED ORDER — CEFAZOLIN SODIUM-DEXTROSE 3-4 GM/150ML-% IV SOLN
3.0000 g | INTRAVENOUS | Status: AC
  Administered 2023-09-08: 3 g via INTRAVENOUS
  Filled 2023-09-08: qty 150

## 2023-09-08 MED ORDER — ORAL CARE MOUTH RINSE: 15.0000 mL | Freq: Once | OROMUCOSAL | Status: AC

## 2023-09-08 MED ORDER — METOPROLOL SUCCINATE ER 25 MG PO TB24
25.0000 mg | ORAL_TABLET | Freq: Every day | ORAL | Status: DC
  Administered 2023-09-09: 25 mg via ORAL
  Filled 2023-09-08: qty 1

## 2023-09-08 MED ORDER — ATORVASTATIN CALCIUM 80 MG PO TABS
80.0000 mg | ORAL_TABLET | Freq: Every day | ORAL | Status: DC
  Administered 2023-09-08: 80 mg via ORAL
  Filled 2023-09-08: qty 1

## 2023-09-08 MED ORDER — ROCURONIUM BROMIDE 10 MG/ML (PF) SYRINGE
PREFILLED_SYRINGE | INTRAVENOUS | Status: DC | PRN
  Administered 2023-09-08: 20 mg via INTRAVENOUS
  Administered 2023-09-08: 10 mg via INTRAVENOUS
  Administered 2023-09-08: 100 mg via INTRAVENOUS

## 2023-09-08 MED ORDER — PROTAMINE SULFATE 10 MG/ML IV SOLN
INTRAVENOUS | Status: DC | PRN
  Administered 2023-09-08: 50 mg via INTRAVENOUS

## 2023-09-08 MED ORDER — PANTOPRAZOLE SODIUM 40 MG PO TBEC
40.0000 mg | DELAYED_RELEASE_TABLET | Freq: Every day | ORAL | Status: DC
  Administered 2023-09-08 – 2023-09-09 (×2): 40 mg via ORAL
  Filled 2023-09-08 (×2): qty 1

## 2023-09-08 MED ORDER — POTASSIUM CHLORIDE CRYS ER 20 MEQ PO TBCR: 20.0000 meq | EXTENDED_RELEASE_TABLET | Freq: Every day | ORAL | Status: DC | PRN

## 2023-09-08 MED ORDER — MORPHINE SULFATE (PF) 2 MG/ML IV SOLN: 2.0000 mg | INTRAVENOUS | Status: DC | PRN

## 2023-09-08 MED ORDER — LABETALOL HCL 5 MG/ML IV SOLN: 10.0000 mg | INTRAVENOUS | Status: DC | PRN

## 2023-09-08 MED ORDER — SODIUM CHLORIDE 0.9 % IV SOLN: 500.0000 mL | Freq: Once | INTRAVENOUS | Status: DC | PRN

## 2023-09-08 MED ORDER — MAGNESIUM SULFATE 2 GM/50ML IV SOLN: 2.0000 g | Freq: Every day | INTRAVENOUS | Status: DC | PRN

## 2023-09-08 MED ORDER — LACTATED RINGERS IV SOLN
INTRAVENOUS | Status: DC
Start: 2023-09-08 — End: 2023-09-08

## 2023-09-08 MED ORDER — BUDESON-GLYCOPYRROL-FORMOTEROL 160-9-4.8 MCG/ACT IN AERO
2.0000 | INHALATION_SPRAY | Freq: Two times a day (BID) | RESPIRATORY_TRACT | Status: DC
  Administered 2023-09-08 – 2023-09-09 (×2): 2 via RESPIRATORY_TRACT
  Filled 2023-09-08: qty 5.9

## 2023-09-08 MED ORDER — ONDANSETRON HCL 4 MG/2ML IJ SOLN
INTRAMUSCULAR | Status: AC
  Filled 2023-09-08: qty 2

## 2023-09-08 MED ORDER — DEXAMETHASONE SODIUM PHOSPHATE 10 MG/ML IJ SOLN
INTRAMUSCULAR | Status: DC | PRN
  Administered 2023-09-08: 10 mg via INTRAVENOUS

## 2023-09-08 MED ORDER — HEPARIN 6000 UNIT IRRIGATION SOLUTION
Status: DC | PRN
  Administered 2023-09-08: 1

## 2023-09-08 MED ORDER — OXYCODONE HCL 5 MG/5ML PO SOLN: 5.0000 mg | Freq: Once | ORAL | Status: DC | PRN

## 2023-09-08 MED ORDER — EPHEDRINE 5 MG/ML INJ
INTRAVENOUS | Status: AC
  Filled 2023-09-08: qty 5

## 2023-09-08 MED ORDER — CEFAZOLIN SODIUM-DEXTROSE 2-4 GM/100ML-% IV SOLN
2.0000 g | INTRAVENOUS | Status: DC
  Filled 2023-09-08: qty 100

## 2023-09-08 MED ORDER — ASPIRIN 81 MG PO CHEW
81.0000 mg | CHEWABLE_TABLET | Freq: Every day | ORAL | Status: DC
  Administered 2023-09-09: 81 mg via ORAL
  Filled 2023-09-08: qty 1

## 2023-09-08 MED ORDER — ROCURONIUM BROMIDE 10 MG/ML (PF) SYRINGE
PREFILLED_SYRINGE | INTRAVENOUS | Status: AC
Start: 2023-09-08 — End: ?
  Filled 2023-09-08: qty 20

## 2023-09-08 MED ORDER — GUAIFENESIN-DM 100-10 MG/5ML PO SYRP: 15.0000 mL | ORAL_SOLUTION | ORAL | Status: DC | PRN

## 2023-09-08 MED ORDER — OXYCODONE-ACETAMINOPHEN 5-325 MG PO TABS: 1.0000 | ORAL_TABLET | ORAL | Status: DC | PRN

## 2023-09-08 MED ORDER — SUGAMMADEX SODIUM 200 MG/2ML IV SOLN
INTRAVENOUS | Status: DC | PRN
  Administered 2023-09-08: 200 mg via INTRAVENOUS

## 2023-09-08 MED ORDER — PHENYLEPHRINE HCL-NACL 20-0.9 MG/250ML-% IV SOLN
INTRAVENOUS | Status: DC | PRN
Start: 2023-09-08 — End: 2023-09-08
  Administered 2023-09-08: 75 ug/min via INTRAVENOUS

## 2023-09-08 MED ORDER — HYDRALAZINE HCL 20 MG/ML IJ SOLN: 5.0000 mg | INTRAMUSCULAR | Status: DC | PRN

## 2023-09-08 MED ORDER — DEXAMETHASONE SODIUM PHOSPHATE 10 MG/ML IJ SOLN
INTRAMUSCULAR | Status: AC
  Filled 2023-09-08: qty 1

## 2023-09-08 MED ORDER — HEPARIN 6000 UNIT IRRIGATION SOLUTION
Status: AC
  Filled 2023-09-08: qty 500

## 2023-09-08 MED ORDER — LIDOCAINE 2% (20 MG/ML) 5 ML SYRINGE
INTRAMUSCULAR | Status: AC
  Filled 2023-09-08: qty 5

## 2023-09-08 MED ORDER — NITROGLYCERIN 0.4 MG SL SUBL: 0.4000 mg | SUBLINGUAL_TABLET | SUBLINGUAL | Status: DC | PRN

## 2023-09-08 MED ORDER — HEPARIN SODIUM (PORCINE) 1000 UNIT/ML IJ SOLN
INTRAMUSCULAR | Status: AC
  Filled 2023-09-08: qty 1

## 2023-09-08 MED ORDER — FENTANYL CITRATE (PF) 100 MCG/2ML IJ SOLN: 25.0000 ug | INTRAMUSCULAR | Status: DC | PRN

## 2023-09-08 MED ORDER — ONDANSETRON HCL 4 MG/2ML IJ SOLN
INTRAMUSCULAR | Status: DC | PRN
  Administered 2023-09-08: 4 mg via INTRAVENOUS

## 2023-09-08 MED ORDER — HEPARIN SODIUM (PORCINE) 5000 UNIT/ML IJ SOLN
5000.0000 [IU] | Freq: Three times a day (TID) | INTRAMUSCULAR | Status: DC
  Administered 2023-09-09: 5000 [IU] via SUBCUTANEOUS
  Filled 2023-09-08: qty 1

## 2023-09-08 MED ORDER — FENTANYL CITRATE (PF) 250 MCG/5ML IJ SOLN
INTRAMUSCULAR | Status: AC
  Filled 2023-09-08: qty 5

## 2023-09-08 MED ORDER — ACETAMINOPHEN 325 MG PO TABS: 325.0000 mg | ORAL_TABLET | ORAL | Status: DC | PRN

## 2023-09-08 MED ORDER — ONDANSETRON HCL 4 MG/2ML IJ SOLN: 4.0000 mg | Freq: Four times a day (QID) | INTRAMUSCULAR | Status: DC | PRN

## 2023-09-08 MED ORDER — HEPARIN SODIUM (PORCINE) 1000 UNIT/ML IJ SOLN
INTRAMUSCULAR | Status: DC | PRN
  Administered 2023-09-08: 3000 [IU] via INTRAVENOUS
  Administered 2023-09-08: 14000 [IU] via INTRAVENOUS

## 2023-09-08 MED ORDER — DOCUSATE SODIUM 100 MG PO CAPS
100.0000 mg | ORAL_CAPSULE | Freq: Every day | ORAL | Status: DC
  Filled 2023-09-08: qty 1

## 2023-09-08 MED ORDER — HYDROCORTISONE (PERIANAL) 2.5 % EX CREA: 1.0000 | TOPICAL_CREAM | Freq: Two times a day (BID) | CUTANEOUS | Status: DC | PRN

## 2023-09-08 MED ORDER — ACETAMINOPHEN 10 MG/ML IV SOLN: 1000.0000 mg | Freq: Once | INTRAVENOUS | Status: DC | PRN

## 2023-09-08 MED ORDER — MIDAZOLAM HCL 2 MG/2ML IJ SOLN
INTRAMUSCULAR | Status: DC | PRN
Start: 2023-09-08 — End: 2023-09-08
  Administered 2023-09-08: 2 mg via INTRAVENOUS

## 2023-09-08 MED ORDER — METOPROLOL TARTRATE 5 MG/5ML IV SOLN
2.0000 mg | INTRAVENOUS | Status: DC | PRN
Start: 2023-09-08 — End: 2023-09-09

## 2023-09-08 MED ORDER — VASOPRESSIN 20 UNIT/ML IV SOLN
INTRAVENOUS | Status: AC
  Filled 2023-09-08: qty 1

## 2023-09-08 MED ORDER — ACETAMINOPHEN 650 MG RE SUPP: 325.0000 mg | RECTAL | Status: DC | PRN

## 2023-09-08 MED ORDER — LIDOCAINE 2% (20 MG/ML) 5 ML SYRINGE
INTRAMUSCULAR | Status: DC | PRN
  Administered 2023-09-08: 80 mg via INTRAVENOUS

## 2023-09-08 MED ORDER — POLYETHYLENE GLYCOL 3350 17 G PO PACK: 17.0000 g | PACK | Freq: Every day | ORAL | Status: DC | PRN

## 2023-09-08 MED ORDER — ALUM & MAG HYDROXIDE-SIMETH 200-200-20 MG/5ML PO SUSP: 15.0000 mL | ORAL | Status: DC | PRN

## 2023-09-08 MED ORDER — PHENOL 1.4 % MT LIQD: 1.0000 | OROMUCOSAL | Status: DC | PRN

## 2023-09-08 MED ORDER — BISACODYL 10 MG RE SUPP: 10.0000 mg | Freq: Every day | RECTAL | Status: DC | PRN

## 2023-09-08 SURGICAL SUPPLY — 45 items
BAG COUNTER SPONGE SURGICOUNT (BAG) ×2 IMPLANT
CANISTER SUCT 3000ML PPV (MISCELLANEOUS) ×2 IMPLANT
CATH BEACON 5.038 65CM KMP-01 (CATHETERS) ×2 IMPLANT
CATH OMNI FLUSH .035X70CM (CATHETERS) ×2 IMPLANT
CATH QUICKCROSS SUPP .035X90CM (MICROCATHETER) IMPLANT
CLOSURE PERCLOSE PROSTYLE (VASCULAR PRODUCTS) IMPLANT
COIL IDC 2D .035 10MMX20CM (Embolic) IMPLANT
COIL IDC 2D .035 8MMX20CM (Embolic) IMPLANT
COVER BACK TABLE 60X90IN (DRAPES) IMPLANT
DERMABOND ADVANCED .7 DNX12 (GAUZE/BANDAGES/DRESSINGS) ×2 IMPLANT
DRAPE INCISE IOBAN 66X45 STRL (DRAPES) IMPLANT
DRAPE PERI GROIN 82X75IN TIB (DRAPES) IMPLANT
DRSG TEGADERM 2-3/8X2-3/4 SM (GAUZE/BANDAGES/DRESSINGS) ×4 IMPLANT
ELECTRODE REM PT RTRN 9FT ADLT (ELECTROSURGICAL) ×4 IMPLANT
EXCLUDER TNK LEG 26MX12X18 (Endovascular Graft) IMPLANT
GAUZE 4X4 16PLY ~~LOC~~+RFID DBL (SPONGE) IMPLANT
GAUZE SPONGE 2X2 8PLY STRL LF (GAUZE/BANDAGES/DRESSINGS) ×2 IMPLANT
GLIDEWIRE ADV .035X180CM (WIRE) IMPLANT
GLOVE BIO SURGEON STRL SZ7.5 (GLOVE) ×2 IMPLANT
GLOVE BIOGEL PI IND STRL 8 (GLOVE) ×2 IMPLANT
GOWN STRL REUS W/ TWL LRG LVL3 (GOWN DISPOSABLE) ×6 IMPLANT
GOWN STRL REUS W/ TWL XL LVL3 (GOWN DISPOSABLE) ×2 IMPLANT
GRAFT BALLN CATH 65CM (BALLOONS) ×2 IMPLANT
KIT BASIN OR (CUSTOM PROCEDURE TRAY) ×2 IMPLANT
KIT TURNOVER KIT B (KITS) ×2 IMPLANT
NS IRRIG 1000ML POUR BTL (IV SOLUTION) ×2 IMPLANT
PACK ENDOVASCULAR (PACKS) ×2 IMPLANT
PAD ARMBOARD POSITIONER FOAM (MISCELLANEOUS) ×4 IMPLANT
SET MICROPUNCTURE 5F STIFF (MISCELLANEOUS) ×2 IMPLANT
SHEATH BRITE TIP 8FR 23CM (SHEATH) ×2 IMPLANT
SHEATH DRYSEAL FLEX 12FR 33CM (SHEATH) IMPLANT
SHEATH DRYSEAL FLEX 16FR 33CM (SHEATH) IMPLANT
SHEATH FLEX ANSEL ANG 6F 45CM (SHEATH) IMPLANT
SHEATH HIGHFLEX ANSEL 6FRX55 (SHEATH) IMPLANT
SHEATH PINNACLE 8F 10CM (SHEATH) ×2 IMPLANT
STENT GRAFT CONTRALAT 16X12X10 (Vascular Products) IMPLANT
STENT GRAFT CONTRALAT 20X11.5 (Vascular Products) IMPLANT
STOPCOCK MORSE 400PSI 3WAY (MISCELLANEOUS) ×2 IMPLANT
SUT MNCRL AB 4-0 PS2 18 (SUTURE) ×2 IMPLANT
SYR 20ML LL LF (SYRINGE) ×2 IMPLANT
TOWEL GREEN STERILE (TOWEL DISPOSABLE) ×2 IMPLANT
TRAY FOLEY MTR SLVR 16FR STAT (SET/KITS/TRAYS/PACK) ×2 IMPLANT
TUBING HIGH PRESSURE 120CM (CONNECTOR) ×2 IMPLANT
WIRE AMPLATZ SS-J .035X180CM (WIRE) ×4 IMPLANT
WIRE BENTSON .035X145CM (WIRE) ×4 IMPLANT

## 2023-09-08 NOTE — Op Note (Signed)
 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  Ultrasound-guided access bilateral common femoral arteries with Perclose closure 2.  Aortogram with catheter selection of aorta 3.  Catheter selection of right internal iliac artery 4.  Coil embolization right internal iliac artery (10 mm x 20 cm interlock and 8 mm x 20 cm interlock coils) 5.  Repair of right common iliac aneurysm with aortobiiliac stent graft 6.  Placement of extension endovascular limb on the right  Surgeon: Dr. 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  He presents for stent graft repair including coil embolization of the right internal iliac artery after risks benefits discussed.  Assistant was needed given the complexity of the case and also for wire and sheath exchange.  Findings: Ultrasound-guided access bilateral common femoral arteries with delivery of Perclose closure devices.  Both of the common femorals were heavily calcified and diseased.  16 French Gore dry seal was placed in the right common femoral artery with a 12 French Gore dry seal in the left common femoral artery.  Initial aortogram showed both hypogastrics were patent.  The right internal iliac artery was then coiled from the left groin with catheter selection using two interlock coils.  We then placed an aortobiiliac stent graft from the infrarenal aorta to both iliacs.  The main body was 26 mm x 12 mm x 18 cm delivered on the right.  After the gate was cannulated we extended on the left with a 20 mm x 12 cm limb into the common iliac artery with preservation of the hypogastric.  On the right we extended after deployment of the main body with a 12 mm x 10 cm into the external iliac artery.  Exclusion of right common iliac aneurysm at completion with widely patent stent  graft.  Anesthesia: General  Details: Patient was taken to the operating room after informed consent was obtained.  General endotracheal anesthesia was induced.  The bilateral groins and abdominal wall were then prepped and draped in standard sterile fashion.  Antibiotics were given and timeout performed.  Initially used ultrasound to evaluate the right common femoral artery, it was patent, an image was saved.  This was heavily calcified as noted on CT.  We accessed this under ultrasound guidance with a micro access needle placed a micro wire and then a microsheath.  I then used a Bentson wire and then dilated with a 8 [place] dilator and subsequently placed Perclose devices at 10:00 and 2:00 in the right common femoral artery.  We then did the same steps in the left groin again accessing this with ultrasound guidance placing a micro needle and then a microwire micro sheath.  Elected to place one Perclose due to heavy disease in the artery.  Ultimately we put short 8 French sheath in both groins.  The patient is given 100 units/kg IV heparin .  Additional heparin  was given.  We ensured we had a therapeutic ACT of 250.  I then upsized to the 12 [place] Gore sheath in the left groin after exchanging for Amplatz wires.  I then came over the aortic bifurcation with an Omni Flush catheter and a Glidewire advantage and then advanced a 6 [place] Ansell sheath into the right common iliac aneurysm.  We got dedicated imaging to identify the right hypogastric.  This was then cannulated with an  angled catheter and then I advanced a quick cross catheter into the right internal iliac artery.  This was then coiled with two interlock coils using a 10 mm x 20 cm and 8 mm x 20 cm coils.  Very satisfied with these results the catheter was removed and we did hand-injection at completion to ensure these were adequately placed.  I then put stiff wires back up into the descending thoracic aorta.  We upsized to the 16 [place] Gore dry seal  in the right groin.  The main body device was then delivered up the right groin with a pigtail at the left.  We got imaging to identify both renal arteries.  The main body device was then deployed down to the gate just below the renals.  I did end up reconstructing this and pushing the device up.  We then came from the left groin and were able to cannulate the gate with a buddy wire using a KMP and a Bentson wire.  I spun the pigtail catheter in the main body of the graft to confirm it was in the true lumen.  We then got a retrograde sheath shot on the left and extended on the left with the iliac extension with that was landed in the common iliac with preservation of the hypogastric.  We finished deploying the main body down the right side and then placed an extension limb into the external iliac on the right.  We then used MOB balloon and treated all proximal distal landing zones and overlap the components.  Final imaging showed no endoleak with exclusion of the aneurysm.  The right internal iliac branches filled distal to the coils.  The left hypogastric was preserved.  We then removed the 12 French sheath in the left groin while holding manual pressure and tied down the Perclose with the help of my assistant.  I did get a retrograde shot with a microcatheter that showed no stenosis and ultimately satisfied with results we went ahead and locked down the Perclose in the left groin.  We then did the same steps in the right groin removing the 16 French sheath holding manual pressure and tightened with two Perclose's with the help of my assistant.  Ultimately I had to place a third Perclose in the right groin.  Again we shot a shot with a microcatheter showing no stenosis and a patent artery.  We tied down the right groin Perclose's with good hemostasis.  Both groin incisions were closed with 4-0 Monocryl Dermabond the patient was taken to recovery in stable condition.  Complication: None  Condition:  Stable  [name], MD [facility] [place] Office: [phone]   [name]

## 2023-09-08 NOTE — Progress Notes (Signed)
  Day of Surgery Note    Subjective:  says his left groin is a little itchy.     Vitals:   09/08/23 1130 09/08/23 1145  BP: 93/68 104/67  Pulse: 60 (!) 59  Resp: 14 11  Temp:    SpO2: 94% 95%    Incisions:   bilateral groins soft without hematoma Extremities:  palpable DP pulses bilaterally Cardiac:  regular Lungs:  non labored Abdomen:  soft, non tender   Assessment/Plan:  This is a 63 y.o. male who is s/p  Coil embolization right IIA, repair of right CIA aneurysm with aortobi-liliac stent graft and placement of extension endovascular limb on the right  -pt doing well in recovery.  Bilateral groins are soft without evidence of hematoma.  He has palpable DP pulses bilaterally -for transfer to [facility] later today.  Anticipate discharge tomorrow if no events overnight.  -asa/statin -f/u in [place] with Dr. Fulton Job in 4 weeks with CTA a/p.  Message has been sent to the office.    Maryanna Smart, PA-C 09/08/2023 12:07 PM [phone]

## 2023-09-08 NOTE — Progress Notes (Signed)
 Pt received from PACU, CCMD notified, V/S obtained, CHG given, B/L groins level 0, Pulse is present on DP, PT, no any complains, call bell in reach  09/08/23 1305  Vitals  Temp 97.7 F (36.5 C)  Temp Source Oral  BP 116/82  MAP (mmHg) 92  BP Location Left Arm  BP Method Automatic  Patient Position (if appropriate) Lying  Pulse Rate (!) 52  Pulse Rate Source Monitor  ECG Heart Rate (!) 53  Resp 17  Level of Consciousness  Level of Consciousness Alert  MEWS COLOR  MEWS Score Color Green  Oxygen Therapy  SpO2 97 %  O2 Device Nasal Cannula  O2 Flow Rate (L/min) 2 L/min  Art Line  Arterial Line BP 128/68  Arterial Line MAP (mmHg) 91 mmHg  Pain Assessment  Pain Scale 0-10  Pain Score 0  MEWS Score  MEWS Temp 0  MEWS Systolic 0  MEWS Pulse 0  MEWS RR 0  MEWS LOC 0  MEWS Score 0

## 2023-09-08 NOTE — Discharge Instructions (Signed)

## 2023-09-08 NOTE — Anesthesia Procedure Notes (Signed)
 Procedure Name: Intubation Date/Time: 09/08/2023 7:36 AM  Performed by: Jana Mcgregor, CRNAPre-anesthesia Checklist: Patient identified, Emergency Drugs available, Suction available, Patient being monitored and Timeout performed Patient Re-evaluated:Patient Re-evaluated prior to induction Oxygen Delivery Method: Circle system utilized Preoxygenation: Pre-oxygenation with 100% oxygen Induction Type: IV induction Ventilation: Mask ventilation without difficulty Laryngoscope Size: Miller and 2 Grade View: Grade I Tube type: Oral Tube size: 7.5 mm Number of attempts: 1 Airway Equipment and Method: Patient positioned with wedge pillow Placement Confirmation: ETT inserted through vocal cords under direct vision, positive ETCO2, CO2 detector and breath sounds checked- equal and bilateral Secured at: 22 cm Tube secured with: Tape Dental Injury: Teeth and Oropharynx as per pre-operative assessment  Difficulty Due To: Difficulty was unanticipated Comments: Placed with ease grade 1 view with miller #2 - positive bilateral breath sounds ETCo2  Soft airway placed

## 2023-09-08 NOTE — Transfer of Care (Signed)
 Immediate Anesthesia Transfer of Care Note  Patient: Vincent Griffin  Procedure(s) Performed: INSERTION, ENDOVASCULAR STENT GRAFT, AORTA, ABDOMINAL EMBOLIZATION, USING COIL, OF RIGHT INTERNAL ILIAC (Left: Abdomen)  Patient Location: PACU  Anesthesia Type:General  Level of Consciousness: awake and alert   Airway & Oxygen Therapy: Patient Spontanous Breathing  Post-op Assessment: Report given to RN  Post vital signs: Reviewed  Last Vitals:  Vitals Value Taken Time  BP 103/73 09/08/23 1022  Temp 97.2   Pulse 63 09/08/23 1026  Resp 18 09/08/23 1026  SpO2 100 % 09/08/23 1026  Vitals shown include unfiled device data.  Last Pain:  Vitals:   09/08/23 0607  TempSrc:   PainSc: 5       Patients Stated Pain Goal: 1 (09/08/23 0865)  Complications:  Encounter Notable Events  Notable Event Outcome Phase Comment  Difficult to intubate - unexpected  Intraprocedure Filed from anesthesia note documentation.

## 2023-09-08 NOTE — H&P (Signed)
 History and Physical Interval Note:  09/08/2023 7:23 AM  Vincent Griffin  has presented today for surgery, with the diagnosis of aneurysm of iliac artery.  The various methods of treatment have been discussed with the patient and family. After consideration of risks, benefits and other options for treatment, the patient has consented to  Procedure(s): INSERTION, ENDOVASCULAR STENT GRAFT, AORTA, ABDOMINAL (N/A) as a surgical intervention.  The patient's history has been reviewed, patient examined, no change in status, stable for surgery.  I have reviewed the patient's chart and labs.  Questions were answered to the patient's satisfaction.    Coil right hypo with aortobi-iliac stent graft for right common iliac aneurysm  [name]     Patient name: Vincent Griffin      MRN: [id number]        DOB: January 06, 1961          Sex: male   REASON FOR CONSULT: Right common iliac artery aneurysm   HPI: Vincent Griffin is a 63 y.o. 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   Patient is followed by cardiology at [facility] and was last seen last year.  Denies any chest pain today.       Past Medical History:  Diagnosis Date   CAD (coronary artery disease)     History of kidney stones     Hypercholesterolemia     Hypertension     Myocardial infarction [facility]) 2022               Past Surgical History:  Procedure Laterality Date   BIOPSY   09/08/2022    Procedure: BIOPSY;  Surgeon: Vinetta Greening, DO;  [facility];  Service: Endoscopy;;   BIOPSY   04/10/2023    Procedure: BIOPSY;  Surgeon: Vinetta Greening, DO;  [facility];  Service: Endoscopy;;   CARDIAC  CATHETERIZATION   07/2020    with stent.   CATARACT EXTRACTION       CATARACT EXTRACTION W/PHACO Left 01/07/2016    Procedure: CATARACT EXTRACTION PHACO AND INTRAOCULAR LENS PLACEMENT LEFT EYE CDE=13.32;  Surgeon: [name], MD;  Location: [facility];  Service: Ophthalmology;  Laterality: Left;   COLONOSCOPY WITH PROPOFOL  N/A 09/08/2022    Procedure: COLONOSCOPY WITH PROPOFOL ;  Surgeon: Vinetta Greening, DO;  [facility];  Service: Endoscopy;  Laterality: N/A;  11:30 am   COLONOSCOPY WITH PROPOFOL  N/A 04/10/2023    Procedure: COLONOSCOPY WITH PROPOFOL ;  Surgeon: Vinetta Greening, DO;  [facility];  Service: Endoscopy;  Laterality: N/A;  100pm, asa 3   POLYPECTOMY   09/08/2022    Procedure: POLYPECTOMY;  Surgeon: Vinetta Greening, DO;  [facility];  Service: Endoscopy;;   POLYPECTOMY   04/10/2023    Procedure: POLYPECTOMY;  Surgeon: Vinetta Greening, DO;  [facility];  Service: Endoscopy;;   ROBOT ASSISTED LAPAROSCOPIC PARTIAL COLECTOMY   10/07/2022   SMALL INTESTINE SURGERY   1972   SUBMUCOSAL TATTOO INJECTION   09/08/2022    Procedure: SUBMUCOSAL TATTOO INJECTION;  Surgeon: Vinetta Greening, DO;  [facility];  Service: Endoscopy;;  Family History  Problem Relation Age of Onset   Diabetes Mother     Stroke Mother     Colon polyps Brother     Colon cancer Neg Hx            SOCIAL HISTORY: Social History         Socioeconomic History   Marital status: Married      Spouse name: Not on file   Number of children: Not on file   Years of education: Not on file   Highest education level: Not on file  Occupational History   Not on file  Tobacco Use   Smoking status: Heavy Smoker      Current packs/day: 1.00      Types: Cigarettes   Smokeless tobacco: Never  Vaping Use   Vaping status: Never Used  Substance and Sexual Activity   Alcohol use: No   Drug use: No   Sexual activity: Not on file  Other  Topics Concern   Not on file  Social History Narrative   Not on file    Social Drivers of Health        Financial Resource Strain: Low Risk  (08/12/2020)    Received from [facility], [facility]    Overall Financial Resource Strain (CARDIA)     Difficulty of Paying Living Expenses: Not hard at all  Food Insecurity: No Food Insecurity (10/14/2022)    Hunger Vital Sign     Worried About Running Out of Food in the Last Year: Never true     Ran Out of Food in the Last Year: Never true  Transportation Needs: No Transportation Needs (10/14/2022)    PRAPARE - Therapist, art (Medical): No     Lack of Transportation (Non-Medical): No  Physical Activity: Not on file  Stress: No Stress Concern Present (08/12/2020)    Received from [facility], [facility] - Occupational Stress Questionnaire     Feeling of Stress : Not at all  Social Connections: Unknown (09/20/2021)    Received from [facility], [facility]    Social Network     Social Network: Not on file  Intimate Partner Violence: Not At Risk (07/19/2023)    Received from [facility]    HITS     Over the last 12 months how often did your partner physically hurt you?: Never     Over the last 12 months how often did your partner insult you or talk down to you?: Never     Over the last 12 months how often did your partner threaten you with physical harm?: Never     Over the last 12 months how often did your partner scream or curse at you?: Never      Allergies       Allergies  Allergen Reactions   Zetia [Ezetimibe] Diarrhea and Nausea Only      Upset stomach              Current Outpatient Medications  Medication Sig Dispense Refill   albuterol  (VENTOLIN  HFA) 108 (90 Base) MCG/ACT inhaler Inhale 2 puffs into the lungs every 6 (six) hours as needed for wheezing or shortness of breath. 8 g 2   aspirin  81 MG chewable tablet Chew 81 mg by mouth daily.        atorvastatin  (LIPITOR) 80 MG tablet Take 80 mg by mouth at bedtime.  Budeson-Glycopyrrol-Formoterol  (BREZTRI  AEROSPHERE) 160-9-4.8 MCG/ACT AERO Inhale 2 puffs into the lungs 2 (two) times daily. (Patient not taking: Reported on 08/04/2023) 10.7 g 11   Ferrous Sulfate (IRON PO) Take by mouth. Takes  1 -2 iron tablets per week       hydrocortisone  (ANUSOL -HC) 2.5 % rectal cream Place 1 Application rectally 2 (two) times daily. 30 g 1   metoprolol  succinate (TOPROL -XL) 25 MG 24 hr tablet Take 25 mg by mouth daily.       nitroGLYCERIN (NITROSTAT) 0.4 MG SL tablet Place 0.4 mg under the tongue every 5 (five) minutes as needed for chest pain.          No current facility-administered medications for this visit.        REVIEW OF SYSTEMS:  [X]  denotes positive finding, [ ]  denotes negative finding Cardiac   Comments:  Chest pain or chest pressure:      Shortness of breath upon exertion:      Short of breath when lying flat:      Irregular heart rhythm:             Vascular      Pain in calf, thigh, or hip brought on by ambulation:      Pain in feet at night that wakes you up from your sleep:       Blood clot in your veins:      Leg swelling:              Pulmonary      Oxygen at home:      Productive cough:       Wheezing:              Neurologic      Sudden weakness in arms or legs:       Sudden numbness in arms or legs:       Sudden onset of difficulty speaking or slurred speech:      Temporary loss of vision in one eye:       Problems with dizziness:              Gastrointestinal      Blood in stool:       Vomited blood:              Genitourinary      Burning when urinating:       Blood in urine:             Psychiatric      Major depression:              Hematologic      Bleeding problems:      Problems with blood clotting too easily:             Skin      Rashes or ulcers:             Constitutional      Fever or chills:          PHYSICAL EXAM: There  were no vitals filed for this visit.   GENERAL: The patient is a well-nourished male, in no acute distress. The vital signs are documented above. CARDIAC: There is a regular rate and rhythm.  VASCULAR:  Bilateral femoral pulses palpable Bilateral DP pulses palpable Bilateral lower extremity venous stasis changes PULMONARY: No respiratory distress. ABDOMEN: Soft and non-tender. MUSCULOSKELETAL: There are no major deformities or cyanosis. NEUROLOGIC: No focal weakness or paresthesias are detected. SKIN: There are  no ulcers or rashes noted. PSYCHIATRIC: The patient has a normal affect.   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   Assessment/Plan:   63 y.o. 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   I reviewed the CT imaging and I measure his right common iliac aneurysm at 3.5 cm.  I discussed that we repair these at 3 to 3.5 cm due to risk of rupture.  I have recommended repair with stent graft.  Given there is no room to get seal in the proximal common iliac will require traditional aortobiiliac stent graft with possible coil embolization of the right hypogastric.  I discussed this being done in the operating room in [facility] with bilateral transfemoral access and percutaneous closure.  Discussed risk of anesthesia, stroke, MI, groin cutdown, pelvic ischemia etc.  Will get scheduled at his convenience.     [name], MD [facility] [place] Office: [phone]

## 2023-09-09 LAB — BASIC METABOLIC PANEL WITH GFR
Anion gap: 9 (ref 5–15)
BUN: 15 mg/dL (ref 6–20)
CO2: 25 mmol/L (ref 22–32)
Calcium: 8.6 mg/dL — ABNORMAL LOW (ref 8.9–10.3)
Chloride: 104 mmol/L (ref 98–111)
Creatinine, Ser: 1.01 mg/dL (ref 0.61–1.24)
GFR, Estimated: >60 mL/min (ref >60)
Glucose, Bld: 138 mg/dL — ABNORMAL HIGH (ref 70–99)
Potassium: 4.7 mmol/L (ref 3.5–5.1)
Sodium: 138 mmol/L (ref 135–145)

## 2023-09-09 LAB — CBC
HCT: 40.1 % (ref 39.0–52.0)
Hemoglobin: 13.4 g/dL (ref 13.0–17.0)
MCH: 33.8 pg (ref 26.0–34.0)
MCHC: 33.4 g/dL (ref 30.0–36.0)
MCV: 101.3 fL — ABNORMAL HIGH (ref 80.0–100.0)
Platelets: 179 10*3/uL (ref 150–400)
RBC: 3.96 MIL/uL — ABNORMAL LOW (ref 4.22–5.81)
RDW: 13.3 % (ref 11.5–15.5)
WBC: 15.7 10*3/uL — ABNORMAL HIGH (ref 4.0–10.5)
nRBC: 0 % (ref 0.0–0.2)

## 2023-09-09 LAB — POCT ACTIVATED CLOTTING TIME
Activated Clotting Time: 221 s
Activated Clotting Time: 268 s

## 2023-09-09 MED ORDER — OXYCODONE-ACETAMINOPHEN 5-325 MG PO TABS: 1.0000 | ORAL_TABLET | Freq: Four times a day (QID) | ORAL | 0 refills | Status: DC | PRN

## 2023-09-09 NOTE — Progress Notes (Addendum)
 Progress Note    09/09/2023 7:06 AM 1 Day Post-Op  Subjective:  sitting in chair at bedside.  He has walked in the hallways, voided, denies any pain.    Afebrile HR 60's-70's  110's-130's systolic 90% RA  Vitals:   09/09/23 0000 09/09/23 0300  BP: 100/72 116/74  Pulse: (!) 59 71  Resp: 14 12  Temp:  97.7 F (36.5 C)  SpO2: 93% 91%    Physical Exam: General:  no distress Cardiac:  regular Lungs:  non labored Incisions:  bilateral groins are soft without hematoma Extremities:  palpable DP pulses bilaterally Abdomen:  soft  CBC    Component Value Date/Time   WBC 15.7 (H) 09/09/2023 0300   RBC 3.96 (L) 09/09/2023 0300   HGB 13.4 09/09/2023 0300   HGB 15.0 07/18/2023 1521   HCT 40.1 09/09/2023 0300   HCT 43.7 07/18/2023 1521   PLT 179 09/09/2023 0300   PLT 226 07/18/2023 1521   MCV 101.3 (H) 09/09/2023 0300   MCV 100 (H) 07/18/2023 1521   MCH 33.8 09/09/2023 0300   MCHC 33.4 09/09/2023 0300   RDW 13.3 09/09/2023 0300   RDW 12.8 07/18/2023 1521   LYMPHSABS 1.8 08/15/2023 1142   LYMPHSABS 2.3 07/18/2023 1521   MONOABS 0.6 08/15/2023 1142   EOSABS 0.3 08/15/2023 1142   EOSABS 0.3 07/18/2023 1521   BASOSABS 0.1 08/15/2023 1142   BASOSABS 0.1 07/18/2023 1521    BMET    Component Value Date/Time   NA 138 09/09/2023 0300   NA 140 07/18/2023 1521   K 4.7 09/09/2023 0300   CL 104 09/09/2023 0300   CO2 25 09/09/2023 0300   GLUCOSE 138 (H) 09/09/2023 0300   BUN 15 09/09/2023 0300   BUN 11 07/18/2023 1521   CREATININE 1.01 09/09/2023 0300   CALCIUM  8.6 (L) 09/09/2023 0300   GFRNONAA >60 09/09/2023 0300   GFRAA 87 06/05/2020 1256    INR    Component Value Date/Time   INR 1.2 09/08/2023 1048     Intake/Output Summary (Last 24 hours) at 09/09/2023 0706 Last data filed at 09/09/2023 0300 Gross per 24 hour  Intake 2435.03 ml  Output 1725 ml  Net 710.03 ml      Assessment/Plan:  63 y.o. male is s/p:  Coil embolization right IIA, repair of right  CIA aneurysm with aortobi-liliac stent graft and placement of extension endovascular limb on the right   1 Day Post-Op   -pt with palpable DP pulses bilaterally -acute blood loss anemia-pt tolerating.  No indication for transfusion.  -renal function looks good this am -DVT prophylaxis:  sq heparin  started this am -pt has walked and voided.  Will discharge later this morning.   -discussed we will send a few pain tablets to his pharmacy but if he is not having any pain, he does not need to pick them up.   -f/u with Dr. Fulton Job around 4 weeks with CTA a/p in the [facility] clinic.     Maryanna Smart, PA-C [facility] [phone] 09/09/2023 7:06 AM  I have seen and evaluated the patient. I agree with the PA note as documented above.  Status post aortobiiliac stent graft with coil of the right hypogastric for treatment of right common iliac aneurysm.  Both groins look great.  He has palpable DP pulses bilaterally.  No significant abdominal pain.  Plan discharge today.  Follow-up in 1 month with CTA abdomen pelvis in [place].  Discussed he continue aspirin and statin at discharge.  [name], MD [facility] [place] Office: [phone]

## 2023-09-09 NOTE — Discharge Summary (Signed)
 [facility]AR Discharge Summary   Vincent Griffin July 25, 1960 63 y.o. male  MRN: [id number]  Admission Date: 09/08/2023  Discharge Date: 09/09/2023  Physician: [name], MD  Admission Diagnosis: Aneurysm of iliac artery [facility]) [I72.3] Iliac artery aneurysm (HCC) [I72.3]   HPI:   This is a 63 y.o. 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   Patient is followed by cardiology at [facility] and was last seen last year.  Denies any chest pain today.  Hospital Course:  The patient was admitted to the hospital and taken to the operating room on 09/08/2023 and underwent: 1.  Ultrasound-guided access bilateral common femoral arteries with Perclose closure 2.  Aortogram with catheter selection of aorta 3.  Catheter selection of right internal iliac artery 4.  Coil embolization right internal iliac artery (10 mm x 20 cm interlock and 8 mm x 20 cm interlock coils) 5.  Repair of right common iliac aneurysm with aortobiiliac stent graft 6.  Placement of extension endovascular limb on the right    Findings:  Ultrasound-guided access bilateral common femoral arteries with delivery of Perclose closure devices.  Both of the common femorals were heavily calcified and diseased.  16 French Gore dry seal was placed in the right common femoral artery with a 12 French Gore dry seal in the left common femoral artery.  Initial aortogram showed both hypogastrics were patent.  The right internal iliac artery was then coiled from the left groin with catheter selection using two interlock coils.  We then placed an aortobiiliac stent graft from the infrarenal aorta to both iliacs.  The main body was 26  mm x 12 mm x 18 cm delivered on the right.  After the gate was cannulated we extended on the left with a 20 mm x 12 cm limb into the common iliac artery with preservation of the hypogastric.  On the right we extended after deployment of the main body with a 12 mm x 10 cm into the external iliac artery.  Exclusion of right common iliac aneurysm at completion with widely patent stent graft.   The pt tolerated the procedure well and was transported to the PACU in good condition.   By POD 1, he was doing well with palpable DP pulses bilaterally.  His groins look good without hematoma.  He has ambulated and voided and eating without difficulty.  He is discharged home.    CBC    Component Value Date/Time   WBC 15.7 (H) 09/09/2023 0300   RBC 3.96 (L) 09/09/2023 0300   HGB 13.4 09/09/2023 0300   HGB 15.0 07/18/2023 1521   HCT 40.1 09/09/2023 0300   HCT 43.7 07/18/2023 1521   PLT 179 09/09/2023 0300   PLT 226 07/18/2023 1521   MCV 101.3 (H) 09/09/2023 0300   MCV 100 (H) 07/18/2023 1521   MCH 33.8 09/09/2023 0300   MCHC 33.4 09/09/2023 0300   RDW 13.3 09/09/2023 0300   RDW 12.8 07/18/2023 1521   LYMPHSABS 1.8 08/15/2023 1142   LYMPHSABS 2.3 07/18/2023 1521   MONOABS 0.6 08/15/2023 1142   EOSABS 0.3 08/15/2023 1142   EOSABS 0.3 07/18/2023 1521   BASOSABS 0.1 08/15/2023 1142  BASOSABS 0.1 07/18/2023 1521    BMET    Component Value Date/Time   NA 138 09/09/2023 0300   NA 140 07/18/2023 1521   K 4.7 09/09/2023 0300   CL 104 09/09/2023 0300   CO2 25 09/09/2023 0300   GLUCOSE 138 (H) 09/09/2023 0300   BUN 15 09/09/2023 0300   BUN 11 07/18/2023 1521   CREATININE 1.01 09/09/2023 0300   CALCIUM  8.6 (L) 09/09/2023 0300   GFRNONAA >60 09/09/2023 0300   GFRAA 87 06/05/2020 1256       Discharge Instructions     Discharge patient   Complete by: As directed    Discharge later this morning after breakfast and he has been seen by Dr. Fulton Job.  Thanks   Discharge disposition: 01-Home or  Self Care   Discharge patient date: 09/09/2023       Discharge Diagnosis:  Aneurysm of iliac artery [facility]) [I72.3] Iliac artery aneurysm [facility]) [I72.3]  Secondary Diagnosis: Patient Active Problem List   Diagnosis Date Noted   Iliac artery aneurysm (HCC) 09/08/2023   Aneurysm artery, iliac (HCC) 07/18/2023   Pulmonary emphysema (HCC) 07/18/2023   Prolapsed internal hemorrhoids, grade 2 05/11/2023   Cecal cancer (HCC) 11/02/2022   S/P laparoscopic colectomy 10/07/2022   Rectal bleeding 06/01/2021   H/O non-ST elevation myocardial infarction (NSTEMI) 04/01/2021   CAD (coronary artery disease) 08/13/2020   Hyperlipemia 06/08/2020   Morbid obesity (HCC) 07/19/2016   Current smoker 07/19/2016   Past Medical History:  Diagnosis Date   CAD (coronary artery disease)    Cancer (HCC)    colon   History of kidney stones    Hypercholesterolemia    Hypertension    Iliac artery aneurysm, right (HCC)    Myocardial infarction (HCC) 2022   Peripheral vascular disease (HCC)      Allergies as of 09/09/2023       Reactions   Zetia [ezetimibe] Diarrhea, Nausea Only   Upset stomach        Medication List     TAKE these medications    albuterol  108 (90 Base) MCG/ACT inhaler Commonly known as: VENTOLIN  HFA Inhale 2 puffs into the lungs every 6 (six) hours as needed for wheezing or shortness of breath.   aspirin 81 MG chewable tablet Chew 81 mg by mouth daily.   atorvastatin  80 MG tablet Commonly known as: LIPITOR Take 80 mg by mouth at bedtime.   Breztri  Aerosphere 160-9-4.8 MCG/ACT Aero inhaler Generic drug: budeson-glycopyrrolate -formoterol  Inhale 2 puffs into the lungs 2 (two) times daily.   hydrocortisone  2.5 % rectal cream Commonly known as: ANUSOL -HC Place 1 Application rectally 2 (two) times daily.   IRON PO Take by mouth. Takes  1 -2 iron tablets per week   metoprolol  succinate 25 MG 24 hr tablet Commonly known as: TOPROL -XL Take 25 mg by mouth daily.    nitroGLYCERIN 0.4 MG SL tablet Commonly known as: NITROSTAT Place 0.4 mg under the tongue every 5 (five) minutes as needed for chest pain.   oxyCODONE -acetaminophen  5-325 MG tablet Commonly known as: Percocet Take 1 tablet by mouth every 6 (six) hours as needed for severe pain (pain score 7-10).        Discharge Instructions:  [facility]  Discharge Instructions Endovascular Aortic Aneurysm Repair  Please refer to the following instructions for your post-procedure care. Your surgeon or Physician Assistant will discuss any changes with you.  Activity  You are encouraged to walk as much as you can. You  can slowly return to normal activities but must avoid strenuous activity and heavy lifting until your doctor tells you it's OK. Avoid activities such as vacuuming or swinging a gold club. It is normal to feel tired for several weeks after your surgery. Do not drive until your doctor gives the OK and you are no longer taking prescription pain medications. It is also normal to have difficulty with sleep habits, eating, and bowel movements after surgery. These will go away with time.  Bathing/Showering  You may shower after you go home. If you have an incision, do not soak in a bathtub, hot tub, or swim until the incision heals completely.  Incision Care  Shower every day. Clean your incision with mild soap and water. Pat the area dry with a clean towel. You do not need a bandage unless otherwise instructed. Do not apply any ointments or creams to your incision. If you clothing is irritating, you may cover your incision with a dry gauze pad.  Diet  Resume your normal diet. There are no special food restrictions following this procedure. A low fat/low cholesterol diet is recommended for all patients with vascular disease. In order to heal from your surgery, it is CRITICAL to get adequate nutrition. Your body requires vitamins, minerals, and protein.  Vegetables are the best source of vitamins and minerals. Vegetables also provide the perfect balance of protein. Processed food has little nutritional value, so try to avoid this.  Medications  Resume taking all of your medications unless your doctor or Physician Assistnat tells you not to. If your incision is causing pain, you may take over-the-counter pain relievers such as acetaminophen  (Tylenol ). If you were prescribed a stronger pain medication, please be aware these medications can cause nausea and constipation. Prevent nausea by taking the medication with a snack or meal. Avoid constipation by drinking plenty of fluids and eating foods with a high amount of fiber, such as fruits, vegetables, and grains.  Do not take Tylenol  if you are taking prescription pain medications.   Follow up  Our office will schedule a follow-up appointment with a C.T. scan 3-4 weeks after your surgery.  Please call us  immediately for any of the following conditions  Severe or worsening pain in your legs or feet or in your abdomen back or chest. Increased pain, redness, drainage (pus) from your incision sit. Increased abdominal pain, bloating, nausea, vomiting or persistent diarrhea. Fever of 101 degrees or higher. Swelling in your leg (s),  Reduce your risk of vascular disease  Stop smoking. If you would like help call [facility] at 1-800-QUIT-NOW (818-422-3951) or [place] at 4406185831. Manage your cholesterol Maintain a desired weight Control your diabetes Keep your blood pressure down  If you have questions, please call the office at [phone].   Prescriptions given: 1.  Roxicet #8 No Refill  Disposition: home  Patient's condition: is Good  Follow up: 1. Dr. Fulton Job in 4 weeks with CTA protocol in [facility] clinic   [place], [place] [facility] [phone] 09/09/2023  7:51 AM   - For VQI Registry use - Post-op:  Time to Extubation: [x]  In OR, [ ]  <  12 hrs, [ ]  12-24 hrs, [ ]  >=24 hrs Vasopressors Req. Post-op: No MI: No., [ ]  Troponin only, [ ]  EKG or Clinical New Arrhythmia: No CHF: No ICU Stay: 1 day in progressive Transfusion: No     If yes, n/a units given  Complications: Resp failure: No., [ ]  Pneumonia, [ ]   Ventilator Chg in renal function: No., [ ]  Inc. Cr > 0.5, [ ]  Temp. Dialysis,  [ ]  Permanent dialysis Leg ischemia: No., no Surgery needed, [ ]  Yes, Surgery needed,  [ ]  Amputation Bowel ischemia: No., [ ]  Medical Rx, [ ]  Surgical Rx Wound complication: No., [ ]  Superficial separation/infection, [ ]  Return to OR Return to OR: No  Return to OR for bleeding: No Stroke: No., [ ]  Minor, [ ]  Major  Discharge medications: Statin use:  Yes  ASA use:  Yes  Plavix use:  No  Beta blocker use:  Yes  ARB use:  No ACEI use:  No CCB use:  No

## 2023-09-09 NOTE — Progress Notes (Signed)
 Pt ambulated x 300 feet around the unit with front wheel walker pt tolerated well

## 2023-09-11 ENCOUNTER — Telehealth: Payer: Self-pay

## 2023-09-11 ENCOUNTER — Telehealth: Payer: Self-pay | Admitting: *Deleted

## 2023-09-11 ENCOUNTER — Encounter (HOSPITAL_COMMUNITY): Payer: Self-pay | Admitting: Vascular Surgery

## 2023-09-11 NOTE — Transitions of Care (Post Inpatient/ED Visit) (Signed)
   09/11/2023  Name: Vincent Griffin MRN: [id number] DOB: [birth year]  Today's TOC FU Call Status: Today's TOC FU Call Status:: Unsuccessful Call (1st Attempt) Unsuccessful Call (1st Attempt) Date: 09/11/23  Attempted to reach the patient regarding the most recent Inpatient/ED visit.  Follow Up Plan: Additional outreach attempts will be made to reach the patient to complete the Transitions of Care (Post Inpatient/ED visit) call.   Cecilie Coffee [facility], BSN RN Care Manager/ Transition of Care [place] [phone]

## 2023-09-11 NOTE — Telephone Encounter (Signed)
 Returned pt's call left on triage. Left VM for him to call us  back if he still needs assistance.

## 2023-09-11 NOTE — Addendum Note (Signed)
 Addendum  created 09/11/23 1135 by Jana Mcgregor, CRNA   Attestation recorded in [place], Intraprocedure Attestations filed

## 2023-09-11 NOTE — Anesthesia Postprocedure Evaluation (Signed)
 Anesthesia Post Note  Patient: Vincent Griffin  Procedure(s) Performed: INSERTION, ENDOVASCULAR STENT GRAFT, AORTA, ABDOMINAL EMBOLIZATION, USING COIL, OF RIGHT INTERNAL ILIAC (Left: Abdomen)     Patient location during evaluation: PACU Anesthesia Type: General Level of consciousness: awake Pain management: pain level controlled Vital Signs Assessment: post-procedure vital signs reviewed and stable Respiratory status: spontaneous breathing, nonlabored ventilation and respiratory function stable Cardiovascular status: blood pressure returned to baseline and stable Postop Assessment: no apparent nausea or vomiting Anesthetic complications: yes   Encounter Notable Events  Notable Event Outcome Phase Comment  Difficult to intubate - unexpected  Intraprocedure Filed from anesthesia note documentation.    Last Vitals:  Vitals:   09/09/23 0839 09/09/23 0931  BP: 103/73 103/65  Pulse: 65 89  Resp: 14 20  Temp: 36.5 C 36.5 C  SpO2: (!) 89% 92%    Last Pain:  Vitals:   09/09/23 0931  TempSrc: Oral  PainSc: 0-No pain                 Vincent Griffin

## 2023-09-12 ENCOUNTER — Telehealth: Payer: Self-pay

## 2023-09-12 NOTE — Transitions of Care (Post Inpatient/ED Visit) (Signed)
 09/12/2023  Name: Vincent Griffin MRN: [id number] DOB: [birth year]  Today's TOC FU Call Status: Today's TOC FU Call Status:: Successful TOC FU Call Completed TOC FU Call Complete Date: 09/12/23 Patient's Name and Date of Birth confirmed.  Transition Care Management Follow-up Telephone Call Date of Discharge: 09/09/23 Discharge Facility: [facility]) Type of Discharge: Inpatient Admission How have you been since you were released from the hospital?: Worse (no pain, reports chills. Reports urinary frequency.) Any questions or concerns?: No  Items Reviewed: Did you receive and understand the discharge instructions provided?: Yes Medications obtained,verified, and reconciled?: Yes (Medications Reviewed) Any new allergies since your discharge?: No Dietary orders reviewed?: NA Do you have support at home?: Yes People in Home [RPT]: spouse  Medications Reviewed Today: Medications Reviewed Today     Reviewed by Vanetta Generous, RN (Registered Nurse) on 09/12/23 at 1331  Med List Status: <None>   Medication Order Taking? Sig Documenting Provider Last Dose Status Informant  albuterol  (VENTOLIN  HFA) 108 (90 Base) MCG/ACT inhaler [id number] Yes Inhale 2 puffs into the lungs every 6 (six) hours as needed for wheezing or shortness of breath. Chrystine Crate, FNP Taking Active Self  aspirin 81 MG chewable tablet [id number] Yes Chew 81 mg by mouth daily. [provider] Taking Active Self  atorvastatin  (LIPITOR) 80 MG tablet [id number] Yes Take 80 mg by mouth at bedtime. [provider] Taking Active Self  Budeson-Glycopyrrol-Formoterol  (BREZTRI  AEROSPHERE) 160-9-4.8 MCG/ACT AERO [id number] Yes Inhale 2 puffs into the lungs 2 (two) times daily. Yevette Hem, FNP Taking Active Self  cyanocobalamin  (VITAMIN B12) injection 1,000 mcg [id number]   Tommas Fragmin A, FNP  Active   Ferrous Sulfate (IRON PO) [id number] No Take by mouth. Takes  1 -2 iron tablets per week  Patient not  taking: Reported on 09/08/2023   [provider] Not Taking Active Self  hydrocortisone  (ANUSOL -HC) 2.5 % rectal cream [id number] No Place 1 Application rectally 2 (two) times daily.  Patient not taking: Reported on 09/08/2023   Delman Ferns, NP Not Taking Active Self  metoprolol  succinate (TOPROL -XL) 25 MG 24 hr tablet [id number] Yes Take 25 mg by mouth daily. [provider] Taking Active Self  nitroGLYCERIN (NITROSTAT) 0.4 MG SL tablet [id number] No Place 0.4 mg under the tongue every 5 (five) minutes as needed for chest pain.  Patient not taking: Reported on 09/12/2023   [provider] Not Taking Active Self           Med Note Yves Herb, [facility]   Fri Sep 08, 2023  1:15 PM) Has on hand if needed.  oxyCODONE -acetaminophen  (PERCOCET) 5-325 MG tablet [id number] No Take 1 tablet by mouth every 6 (six) hours as needed for severe pain (pain score 7-10).  Patient not taking: Reported on 09/12/2023   Bonnye Butts, PA-C Not Taking Active             Home Care and Equipment/Supplies: Were Home Health Services Ordered?: No Any new equipment or medical supplies ordered?: No  Functional Questionnaire: Do you need assistance with bathing/showering or dressing?: No Do you need assistance with meal preparation?: No Do you need assistance with eating?: No Do you have difficulty maintaining continence: Yes (urinary frequency) Do you need assistance with getting out of bed/getting out of a chair/moving?: No Do you have difficulty managing or taking your medications?: No  Follow up appointments reviewed: PCP Follow-up appointment confirmed?: Yes Date of PCP follow-up appointment?: 09/13/23 Follow-up Provider: Dr. Marylin So Specialist  Hospital Follow-up appointment confirmed?: No Reason Specialist Follow-Up Not Confirmed: Patient has Specialist Provider Number and will Call for Appointment Do you need transportation to your follow-up appointment?: No Do you understand  care options if your condition(s) worsen?: Yes-patient verbalized understanding  SDOH Interventions Today    Flowsheet Row Most Recent Value  SDOH Interventions   Food Insecurity Interventions Intervention Not Indicated  Housing Interventions Intervention Not Indicated  Transportation Interventions Intervention Not Indicated  Utilities Interventions Intervention Not Indicated     Spoke with patient who reports that he is doing well. Reports that he has all his medications and is taking it as prescribed.  Reports he understands his restrictions.  Reports no signs of infection. Reports that he has chills. States no fever.  Patient has called PCP office and has a scheduled appointment for tomorrow am.  Reviewed when to call MD. Reviewed s/s of infection.  Reviewed and offered 30 day TOC program and patient declines. Provided my contact information for patient to call me if needed.   Orpha Blade, RN, BSN, CEN [employer]- Transition of Care Team.  [facility] [phone]

## 2023-09-12 NOTE — Telephone Encounter (Addendum)
 Received a call from pt on 09/12/23 @ 1025.  Patient reporting chills.  Return call made to pt.  Patient reported chills that started 4/27, no temp (98.2 F at the time of the call), no pain, no coughing or trouble breathing, intermittent throbbing in the small of his back, and urinary frequency.  Patient reported he was up urinating, hourly through the night.  Patient stated groin incision, "looks good and is already together."  [employer], PA.  PA recommended the patient make an appt with his PCP to get his urine checked for possible UTI.  Patient was called back and instructed to contact this PCP to get his urine checked. Patient agreed to call PCP as instructed.

## 2023-09-13 ENCOUNTER — Other Ambulatory Visit: Payer: Self-pay

## 2023-09-13 ENCOUNTER — Ambulatory Visit

## 2023-09-13 ENCOUNTER — Encounter: Payer: Self-pay | Admitting: Family Medicine

## 2023-09-13 VITALS — BP 97/65 | HR 77 | Temp 96.9°F | Ht 70.0 in | Wt 303.8 lb

## 2023-09-13 DIAGNOSIS — Z9889 Other specified postprocedural states: Secondary | ICD-10-CM

## 2023-09-13 DIAGNOSIS — I723 Aneurysm of iliac artery: Secondary | ICD-10-CM

## 2023-09-13 DIAGNOSIS — R350 Frequency of micturition: Secondary | ICD-10-CM

## 2023-09-13 LAB — URINALYSIS, ROUTINE W REFLEX MICROSCOPIC
Bilirubin, UA: NEGATIVE
Glucose, UA: NEGATIVE
Leukocytes,UA: NEGATIVE
Nitrite, UA: NEGATIVE
RBC, UA: NEGATIVE
Specific Gravity, UA: 1.02 (ref 1.005–1.030)
Urobilinogen, Ur: 2 mg/dL — ABNORMAL HIGH (ref 0.2–1.0)
pH, UA: 6.5 (ref 5.0–7.5)

## 2023-09-13 LAB — MICROSCOPIC EXAMINATION
Bacteria, UA: NONE SEEN
RBC, Urine: NONE SEEN /HPF (ref 0–2)
Renal Epithel, UA: NONE SEEN /HPF
Yeast, UA: NONE SEEN

## 2023-09-13 NOTE — Progress Notes (Signed)
 Subjective:  Patient ID: Vincent Griffin, male    DOB: 1961/04/15, 63 y.o.   MRN: [id number]  Patient Care Team: Yevette Hem, FNP as PCP - General (Family Medicine) Riley Cheadle Windsor Hatcher, MD as Consulting Physician (Gastroenterology)   Chief Complaint:  Urinary Frequency (X 3 days. Patient states he also has been having some chills. )   HPI: Vincent Griffin is a 63 y.o. male presenting on 09/13/2023 for Urinary Frequency (X 3 days. Patient states he also has been having some chills. )   History of Present Illness   Vincent Griffin "Debborah Fairly is a 63 year old male who presents with chills and urinary frequency following recent iliac artery aneurysm repair.  He has been experiencing chills and frequent urination since Sunday evening. No blood in urine, dark discoloration, discharge, rectal or scrotal pressure, or swelling. He has not had any fevers accompanying the chills.  He underwent surgery for an iliac artery aneurysm repair on the previous Friday, during which a catheter was placed. He has no history of urinary tract infections.  He consumed twelve cups of extra strength coffee on Saturday night and Sunday, which he acknowledges may have contributed to his symptoms. No back or abdominal pain.          Relevant past medical, surgical, family, and social history reviewed and updated as indicated.  Allergies and medications reviewed and updated. Data reviewed: Chart in Epic.   Past Medical History:  Diagnosis Date   CAD (coronary artery disease)    Cancer (HCC)    colon   History of kidney stones    Hypercholesterolemia    Hypertension    Iliac artery aneurysm, right (HCC)    Myocardial infarction (HCC) 2022   Peripheral vascular disease (HCC)     Past Surgical History:  Procedure Laterality Date   ABDOMINAL AORTIC ENDOVASCULAR STENT GRAFT N/A 09/08/2023   Procedure: INSERTION, ENDOVASCULAR STENT GRAFT, AORTA, ABDOMINAL;  Surgeon: [name], MD;  Location: [facility] OR;   Service: Vascular;  Laterality: N/A;   ANEURYSM COILING Left 09/08/2023   Procedure: EMBOLIZATION, USING COIL, OF RIGHT INTERNAL ILIAC;  Surgeon: [name], MD;  Location: [facility] OR;  Service: Vascular;  Laterality: Left;   APPENDECTOMY     BIOPSY  09/08/2022   Procedure: BIOPSY;  Surgeon: Vinetta Greening, DO;  Location: [facility] SUITE;  Service: Endoscopy;;   BIOPSY  04/10/2023   Procedure: BIOPSY;  Surgeon: Vinetta Greening, DO;  Location: [facility];  Service: Endoscopy;;   CARDIAC CATHETERIZATION  07/2020   with stent.   CATARACT EXTRACTION     CATARACT EXTRACTION W/PHACO Left 01/07/2016   Procedure: CATARACT EXTRACTION PHACO AND INTRAOCULAR LENS PLACEMENT LEFT EYE CDE=13.32;  Surgeon: [name], MD;  Location: [facility] ORS;  Service: Ophthalmology;  Laterality: Left;   COLONOSCOPY WITH PROPOFOL  N/A 09/08/2022   Procedure: COLONOSCOPY WITH PROPOFOL ;  Surgeon: Vinetta Greening, DO;  Location: [facility];  Service: Endoscopy;  Laterality: N/A;  11:30 am   COLONOSCOPY WITH PROPOFOL  N/A 04/10/2023   Procedure: COLONOSCOPY WITH PROPOFOL ;  Surgeon: Vinetta Greening, DO;  Location: [facility];  Service: Endoscopy;  Laterality: N/A;  100pm, asa 3   POLYPECTOMY  09/08/2022   Procedure: POLYPECTOMY;  Surgeon: Vinetta Greening, DO;  [facility]: [facility];  Service: Endoscopy;;   POLYPECTOMY  04/10/2023   Procedure: POLYPECTOMY;  Surgeon: Vinetta Greening, DO;  Location: [facility];  Service: Endoscopy;;  ROBOT ASSISTED LAPAROSCOPIC PARTIAL COLECTOMY  10/07/2022   SMALL INTESTINE SURGERY  1972   SUBMUCOSAL TATTOO INJECTION  09/08/2022   Procedure: SUBMUCOSAL TATTOO INJECTION;  Surgeon: Vinetta Greening, DO;  Location: [facility];  Service: Endoscopy;;    Social History   Socioeconomic History   Marital status: Married    Spouse name: Not on file   Number of children: Not on file   Years of education: Not on file   Highest education level: Not on file  Occupational  History   Not on file  Tobacco Use   Smoking status: Heavy Smoker    Current packs/day: 1.00    Types: Cigarettes   Smokeless tobacco: Never  Vaping Use   Vaping status: Never Used  Substance and Sexual Activity   Alcohol use: No   Drug use: No   Sexual activity: Not on file  Other Topics Concern   Not on file  Social History Narrative   Not on file   Social Drivers of Health   Financial Resource Strain: Low Risk  (08/12/2020)   Received from [facility]   Overall Financial Resource Strain (CARDIA)    Difficulty of Paying Living Expenses: Not hard at all  Food Insecurity: No Food Insecurity (09/12/2023)   Hunger Vital Sign    Worried About Running Out of Food in the Last Year: Never true    Ran Out of Food in the Last Year: Never true  Transportation Needs: No Transportation Needs (09/12/2023)   PRAPARE - Administrator, Civil Service (Medical): No    Lack of Transportation (Non-Medical): No  Physical Activity: Not on file  Stress: No Stress Concern Present (08/12/2020)   Received from [facility] - Occupational Stress Questionnaire    Feeling of Stress : Not at all  Social Connections: Unknown (09/20/2021)   Received from [facility], [facility]   Social Network    Social Network: Not on file  Intimate Partner Violence: Not At Risk (09/12/2023)   Humiliation, Afraid, Rape, and Kick questionnaire    Fear of Current or Ex-Partner: No    Emotionally Abused: No    Physically Abused: No    Sexually Abused: No    Outpatient Encounter Medications as of 09/13/2023  Medication Sig   albuterol  (VENTOLIN  HFA) 108 (90 Base) MCG/ACT inhaler Inhale 2 puffs into the lungs every 6 (six) hours as needed for wheezing or shortness of breath.   aspirin 81 MG chewable tablet Chew 81 mg by mouth daily.   atorvastatin  (LIPITOR) 80 MG tablet Take 80 mg by mouth at bedtime.    Budeson-Glycopyrrol-Formoterol  (BREZTRI  AEROSPHERE) 160-9-4.8 MCG/ACT AERO Inhale 2 puffs into the lungs 2 (two) times daily.   hydrocortisone  (ANUSOL -HC) 2.5 % rectal cream Place 1 Application rectally 2 (two) times daily.   metoprolol  succinate (TOPROL -XL) 25 MG 24 hr tablet Take 25 mg by mouth daily.   nitroGLYCERIN (NITROSTAT) 0.4 MG SL tablet Place 0.4 mg under the tongue every 5 (five) minutes as needed for chest pain.   Ferrous Sulfate (IRON PO) Take by mouth. Takes  1 -2 iron tablets per week (Patient not taking: Reported on 09/13/2023)   oxyCODONE -acetaminophen  (PERCOCET) 5-325 MG tablet Take 1 tablet by mouth every 6 (six) hours as needed for severe pain (pain score 7-10). (Patient not taking: Reported on 09/13/2023)   Facility-Administered Encounter Medications as of 09/13/2023  Medication   cyanocobalamin  (VITAMIN B12) injection  1,000 mcg    Allergies  Allergen Reactions   Zetia [Ezetimibe] Diarrhea and Nausea Only    Upset stomach    Pertinent ROS per HPI, otherwise unremarkable      Objective:  BP 97/65   Pulse 77   Temp (!) 96.9 F (36.1 C)   Ht 5\' 10"  (1.778 m)   Wt (!) 303 lb 12.8 oz (137.8 kg)   SpO2 94%   BMI 43.59 kg/m    Wt Readings from Last 3 Encounters:  09/13/23 (!) 303 lb 12.8 oz (137.8 kg)  09/08/23 (!) 310 lb (140.6 kg)  08/23/23 (!) 311 lb 11.2 oz (141.4 kg)    Physical Exam Vitals and nursing note reviewed.  Constitutional:      Appearance: Normal appearance. He is morbidly obese.  HENT:     Head: Normocephalic and atraumatic.     Nose: Nose normal.     Mouth/Throat:     Mouth: Mucous membranes are moist.  Eyes:     Pupils: Pupils are equal, round, and reactive to [place].  Cardiovascular:     Rate and Rhythm: Normal rate and regular rhythm.     Heart sounds: Normal heart sounds.  Pulmonary:     Effort: Pulmonary effort is normal.     Breath sounds: Wheezing (scattered, mild) present.  Abdominal:     General: Bowel sounds are normal.  There is no distension.     Palpations: Abdomen is soft.     Tenderness: There is no abdominal tenderness. There is no right CVA tenderness or left CVA tenderness.  Musculoskeletal:     Cervical back: Neck supple.  Skin:    General: Skin is warm and dry.     Capillary Refill: Capillary refill takes less than 2 seconds.     Findings: Ecchymosis and wound present.       Neurological:     General: No focal deficit present.     Mental Status: He is alert and oriented to person, place, and time.  Psychiatric:        Mood and Affect: Mood normal.        Behavior: Behavior normal. Behavior is cooperative.        Thought Content: Thought content normal.        Judgment: Judgment normal.    Abdomen normal, no signs of infection at incision site. Incision site healing well, no drainage, no redness. Right groin area normal, left side with a small scab.        Results for orders placed or performed during the hospital encounter of 09/08/23  Type and screen [facility]   Collection Time: 09/08/23  5:57 AM  Result Value Ref Range   ABO/RH(D) O POS    Antibody Screen NEG    Sample Expiration      09/11/2023,2359 Performed at [facility], [address] [place], [place] [address]   POCT Activated clotting time   Collection Time: 09/08/23  9:00 AM  Result Value Ref Range   Activated Clotting Time 221 seconds  POCT Activated clotting time   Collection Time: 09/08/23  9:28 AM  Result Value Ref Range   Activated Clotting Time 268 seconds  CBC   Collection Time: 09/08/23 10:48 AM  Result Value Ref Range   WBC 7.8 4.0 - 10.5 K/uL   RBC 4.16 (L) 4.22 - 5.81 MIL/uL   Hemoglobin 13.6 13.0 - 17.0 g/dL   HCT 01.0 27.2 - 53.6 %   MCV  102.6 (H) 80.0 - 100.0 fL   MCH 32.7 26.0 - 34.0 pg   MCHC 31.9 30.0 - 36.0 g/dL   RDW 21.3 08.6 - 57.8 %   Platelets 172 150 - 400 K/uL   nRBC 0.0 0.0 - 0.2 %  Basic metabolic panel   Collection Time: 09/08/23 10:48 AM  Result Value  Ref Range   Sodium 136 135 - 145 mmol/L   Potassium 4.7 3.5 - 5.1 mmol/L   Chloride 104 98 - 111 mmol/L   CO2 25 22 - 32 mmol/L   Glucose, Bld 118 (H) 70 - 99 mg/dL   BUN 13 6 - 20 mg/dL   Creatinine, Ser 4.69 0.61 - 1.24 mg/dL   Calcium  8.4 (L) 8.9 - 10.3 mg/dL   GFR, Estimated >62 >95 mL/min   Anion gap 7 5 - 15  Protime-INR   Collection Time: 09/08/23 10:48 AM  Result Value Ref Range   Prothrombin Time 15.4 (H) 11.4 - 15.2 seconds   INR 1.2 0.8 - 1.2  APTT   Collection Time: 09/08/23 10:48 AM  Result Value Ref Range   aPTT 41 (H) 24 - 36 seconds  Magnesium   Collection Time: 09/08/23 10:48 AM  Result Value Ref Range   Magnesium 1.9 1.7 - 2.4 mg/dL  CBC   Collection Time: 09/09/23  3:00 AM  Result Value Ref Range   WBC 15.7 (H) 4.0 - 10.5 K/uL   RBC 3.96 (L) 4.22 - 5.81 MIL/uL   Hemoglobin 13.4 13.0 - 17.0 g/dL   HCT 28.4 13.2 - 44.0 %   MCV 101.3 (H) 80.0 - 100.0 fL   MCH 33.8 26.0 - 34.0 pg   MCHC 33.4 30.0 - 36.0 g/dL   RDW 10.2 72.5 - 36.6 %   Platelets 179 150 - 400 K/uL   nRBC 0.0 0.0 - 0.2 %  Basic metabolic panel   Collection Time: 09/09/23  3:00 AM  Result Value Ref Range   Sodium 138 135 - 145 mmol/L   Potassium 4.7 3.5 - 5.1 mmol/L   Chloride 104 98 - 111 mmol/L   CO2 25 22 - 32 mmol/L   Glucose, Bld 138 (H) 70 - 99 mg/dL   BUN 15 6 - 20 mg/dL   Creatinine, Ser 4.40 0.61 - 1.24 mg/dL   Calcium  8.6 (L) 8.9 - 10.3 mg/dL   GFR, Estimated >34 >74 mL/min   Anion gap 9 5 - 15       Pertinent labs & imaging results that were available during my care of the patient were reviewed by me and considered in my medical decision making.  Assessment & Plan:  Jhaiden "Debborah Fairly" was seen today for urinary frequency.  Diagnoses and all orders for this visit:  Urinary frequency -     Urine Culture -     Urinalysis, Routine w reflex microscopic  Post-operative state Wounds healing well. No signs of infection or pseudoaneurysm. No hematoma.     Assessment and  Plan    Urinary tract irritation Urinary tract irritation likely secondary to recent catheter placement during iliac artery aneurysm repair. Symptoms include urinary frequency and chills, with no hematuria, dark discoloration, or discharge in urine. Absence of rectal or scrotal pressure, swelling, fever, or back pain. Incision site is healthy with no infection signs. Caffeine intake may contribute to bladder spasms. - Perform urine culture to rule out infection - Increase water intake significantly - Avoid caffeine to prevent bladder spasms - Monitor for new or  worsening symptoms and report if he occurs  Dehydration Urinalysis indicates possible dehydration. Symptoms include chills and urinary frequency since Sunday evening, with no fever. Recent iliac artery aneurysm repair with catheter placement may contribute to symptoms. Increased caffeine intake noted, exacerbating dehydration and causing bladder spasms. - Increase water intake significantly - Avoid caffeine to prevent bladder spasms - Perform urine culture to rule out infection - Monitor for new or worsening symptoms and report if he occurs          Continue all other maintenance medications.  Follow up plan: Return if symptoms worsen or fail to improve.   Continue healthy lifestyle choices, including diet (rich in fruits, vegetables, and lean proteins, and low in salt and simple carbohydrates) and exercise (at least 30 minutes of moderate physical activity daily).    The above assessment and management plan was discussed with the patient. The patient verbalized understanding of and has agreed to the management plan. Patient is aware to call the clinic if they develop any new symptoms or if symptoms persist or worsen. Patient is aware when to return to the clinic for a follow-up visit. Patient educated on when it is appropriate to go to the emergency department.   Kattie Parrot, FNP-C [facility] [phone]

## 2023-09-14 ENCOUNTER — Telehealth: Payer: Self-pay

## 2023-09-14 ENCOUNTER — Inpatient Hospital Stay: Payer: BC Managed Care – PPO | Admitting: Hematology

## 2023-09-14 NOTE — Telephone Encounter (Signed)
 Copied from CRM [id number]. Topic: Clinical - Lab/Test Results >> Sep 14, 2023 11:44 AM Retta Caster wrote: Reason for CRM: Patient calling on Lab results from 04/30 for the urine. Needs call back [phone]

## 2023-09-14 NOTE — Telephone Encounter (Signed)
 Copied from CRM [id number]. Topic: Clinical - Lab/Test Results >> Sep 14, 2023 11:44 AM Retta Caster wrote: Reason for CRM: Patient calling on Lab results from 04/30 for the urine. Needs call back [phone] >> Sep 14, 2023  2:43 PM Tisa Forester wrote: Patient returning kim call regarding his test results     Duplicate message

## 2023-09-15 ENCOUNTER — Encounter: Payer: Self-pay | Admitting: Family Medicine

## 2023-09-15 LAB — URINE CULTURE: Organism ID, Bacteria: NO GROWTH

## 2023-09-19 ENCOUNTER — Ambulatory Visit

## 2023-09-19 DIAGNOSIS — E538 Deficiency of other specified B group vitamins: Secondary | ICD-10-CM | POA: Diagnosis not present

## 2023-09-19 NOTE — Progress Notes (Signed)
 Patient is in office today for a nurse visit for B12 Injection. Patient Injection was given in the  Left deltoid. Patient tolerated injection well.

## 2023-09-27 ENCOUNTER — Ambulatory Visit (HOSPITAL_BASED_OUTPATIENT_CLINIC_OR_DEPARTMENT_OTHER)
Admission: RE | Admit: 2023-09-27 | Discharge: 2023-09-27 | Disposition: A | Discharge status: Home / Self Care | Source: Ambulatory Visit | Attending: Vascular Surgery | Admitting: Vascular Surgery

## 2023-09-27 DIAGNOSIS — N201 Calculus of ureter: Secondary | ICD-10-CM | POA: Diagnosis not present

## 2023-09-27 DIAGNOSIS — E7800 Pure hypercholesterolemia, unspecified: Secondary | ICD-10-CM | POA: Diagnosis present

## 2023-09-27 DIAGNOSIS — R000 Tachycardia, unspecified: Secondary | ICD-10-CM | POA: Diagnosis not present

## 2023-09-27 DIAGNOSIS — B952 Enterococcus as the cause of diseases classified elsewhere: Secondary | ICD-10-CM | POA: Diagnosis present

## 2023-09-27 DIAGNOSIS — Z6841 Body Mass Index (BMI) 40.0 and over, adult: Secondary | ICD-10-CM

## 2023-09-27 DIAGNOSIS — E871 Hypo-osmolality and hyponatremia: Secondary | ICD-10-CM | POA: Diagnosis not present

## 2023-09-27 DIAGNOSIS — Z79899 Other long term (current) drug therapy: Secondary | ICD-10-CM | POA: Diagnosis not present

## 2023-09-27 DIAGNOSIS — E66813 Obesity, class 3: Secondary | ICD-10-CM | POA: Diagnosis present

## 2023-09-27 DIAGNOSIS — E872 Acidosis, unspecified: Secondary | ICD-10-CM | POA: Diagnosis present

## 2023-09-27 DIAGNOSIS — R718 Other abnormality of red blood cells: Secondary | ICD-10-CM | POA: Diagnosis not present

## 2023-09-27 DIAGNOSIS — I2510 Atherosclerotic heart disease of native coronary artery without angina pectoris: Secondary | ICD-10-CM | POA: Diagnosis present

## 2023-09-27 DIAGNOSIS — N111 Chronic obstructive pyelonephritis: Secondary | ICD-10-CM | POA: Diagnosis present

## 2023-09-27 DIAGNOSIS — J9621 Acute and chronic respiratory failure with hypoxia: Secondary | ICD-10-CM | POA: Diagnosis present

## 2023-09-27 DIAGNOSIS — Z716 Tobacco abuse counseling: Secondary | ICD-10-CM

## 2023-09-27 DIAGNOSIS — Z95828 Presence of other vascular implants and grafts: Secondary | ICD-10-CM

## 2023-09-27 DIAGNOSIS — B964 Proteus (mirabilis) (morganii) as the cause of diseases classified elsewhere: Secondary | ICD-10-CM | POA: Diagnosis present

## 2023-09-27 DIAGNOSIS — N139 Obstructive and reflux uropathy, unspecified: Secondary | ICD-10-CM | POA: Diagnosis not present

## 2023-09-27 DIAGNOSIS — N200 Calculus of kidney: Secondary | ICD-10-CM | POA: Diagnosis not present

## 2023-09-27 DIAGNOSIS — J449 Chronic obstructive pulmonary disease, unspecified: Secondary | ICD-10-CM | POA: Diagnosis present

## 2023-09-27 DIAGNOSIS — A4150 Gram-negative sepsis, unspecified: Secondary | ICD-10-CM | POA: Diagnosis not present

## 2023-09-27 DIAGNOSIS — I723 Aneurysm of iliac artery: Secondary | ICD-10-CM | POA: Diagnosis not present

## 2023-09-27 DIAGNOSIS — Z7982 Long term (current) use of aspirin: Secondary | ICD-10-CM | POA: Diagnosis not present

## 2023-09-27 DIAGNOSIS — I10 Essential (primary) hypertension: Secondary | ICD-10-CM | POA: Diagnosis present

## 2023-09-27 DIAGNOSIS — I459 Conduction disorder, unspecified: Secondary | ICD-10-CM | POA: Diagnosis present

## 2023-09-27 DIAGNOSIS — N179 Acute kidney failure, unspecified: Secondary | ICD-10-CM | POA: Diagnosis present

## 2023-09-27 DIAGNOSIS — N132 Hydronephrosis with renal and ureteral calculous obstruction: Secondary | ICD-10-CM | POA: Diagnosis not present

## 2023-09-27 DIAGNOSIS — Z83719 Family history of colon polyps, unspecified: Secondary | ICD-10-CM

## 2023-09-27 DIAGNOSIS — I700 Atherosclerosis of aorta: Secondary | ICD-10-CM | POA: Diagnosis not present

## 2023-09-27 DIAGNOSIS — Z720 Tobacco use: Secondary | ICD-10-CM | POA: Diagnosis not present

## 2023-09-27 DIAGNOSIS — R6521 Severe sepsis with septic shock: Secondary | ICD-10-CM | POA: Diagnosis present

## 2023-09-27 DIAGNOSIS — Z0389 Encounter for observation for other suspected diseases and conditions ruled out: Secondary | ICD-10-CM | POA: Diagnosis not present

## 2023-09-27 DIAGNOSIS — Z888 Allergy status to other drugs, medicaments and biological substances status: Secondary | ICD-10-CM

## 2023-09-27 DIAGNOSIS — K5730 Diverticulosis of large intestine without perforation or abscess without bleeding: Secondary | ICD-10-CM | POA: Diagnosis not present

## 2023-09-27 DIAGNOSIS — I5031 Acute diastolic (congestive) heart failure: Secondary | ICD-10-CM | POA: Diagnosis not present

## 2023-09-27 DIAGNOSIS — K5790 Diverticulosis of intestine, part unspecified, without perforation or abscess without bleeding: Secondary | ICD-10-CM | POA: Diagnosis not present

## 2023-09-27 DIAGNOSIS — N390 Urinary tract infection, site not specified: Secondary | ICD-10-CM | POA: Diagnosis not present

## 2023-09-27 DIAGNOSIS — R7989 Other specified abnormal findings of blood chemistry: Secondary | ICD-10-CM | POA: Diagnosis not present

## 2023-09-27 DIAGNOSIS — Z955 Presence of coronary angioplasty implant and graft: Secondary | ICD-10-CM

## 2023-09-27 DIAGNOSIS — Z8679 Personal history of other diseases of the circulatory system: Secondary | ICD-10-CM

## 2023-09-27 DIAGNOSIS — J9601 Acute respiratory failure with hypoxia: Secondary | ICD-10-CM | POA: Diagnosis not present

## 2023-09-27 DIAGNOSIS — Z85038 Personal history of other malignant neoplasm of large intestine: Secondary | ICD-10-CM

## 2023-09-27 DIAGNOSIS — Z823 Family history of stroke: Secondary | ICD-10-CM

## 2023-09-27 DIAGNOSIS — N400 Enlarged prostate without lower urinary tract symptoms: Secondary | ICD-10-CM | POA: Diagnosis present

## 2023-09-27 DIAGNOSIS — A419 Sepsis, unspecified organism: Secondary | ICD-10-CM | POA: Diagnosis not present

## 2023-09-27 DIAGNOSIS — F17210 Nicotine dependence, cigarettes, uncomplicated: Secondary | ICD-10-CM | POA: Diagnosis present

## 2023-09-27 DIAGNOSIS — I517 Cardiomegaly: Secondary | ICD-10-CM | POA: Diagnosis not present

## 2023-09-27 DIAGNOSIS — N202 Calculus of kidney with calculus of ureter: Secondary | ICD-10-CM | POA: Diagnosis not present

## 2023-09-27 DIAGNOSIS — N136 Pyonephrosis: Secondary | ICD-10-CM | POA: Diagnosis present

## 2023-09-27 DIAGNOSIS — Z87442 Personal history of urinary calculi: Secondary | ICD-10-CM

## 2023-09-27 DIAGNOSIS — Z833 Family history of diabetes mellitus: Secondary | ICD-10-CM

## 2023-09-27 DIAGNOSIS — I252 Old myocardial infarction: Secondary | ICD-10-CM

## 2023-09-27 DIAGNOSIS — I739 Peripheral vascular disease, unspecified: Secondary | ICD-10-CM | POA: Diagnosis present

## 2023-09-27 MED ORDER — IOHEXOL 350 MG/ML SOLN
100.0000 mL | Freq: Once | INTRAVENOUS | Status: AC | PRN
  Administered 2023-09-27: 100 mL via INTRAVENOUS

## 2023-09-28 ENCOUNTER — Encounter (INDEPENDENT_AMBULATORY_CARE_PROVIDER_SITE_OTHER): Payer: Self-pay

## 2023-09-29 ENCOUNTER — Telehealth: Payer: Self-pay

## 2023-09-29 NOTE — Telephone Encounter (Signed)
 Pt called with c/o swelling in ankle to toes of RLE for about 2 days. He has been busy and active. He denies any pain, color change, coolness or warmth to the RLE. He has been advised to monitor the area and let us  know if anything changes/worsens. He is aware of his post op appt. Next week.Aaron Aas

## 2023-09-30 ENCOUNTER — Other Ambulatory Visit: Payer: Self-pay

## 2023-09-30 ENCOUNTER — Encounter (HOSPITAL_COMMUNITY): Payer: Self-pay | Admitting: Emergency Medicine

## 2023-09-30 ENCOUNTER — Emergency Department (HOSPITAL_COMMUNITY)

## 2023-09-30 ENCOUNTER — Inpatient Hospital Stay (HOSPITAL_COMMUNITY)
Admission: EM | Admit: 2023-09-30 | Discharge: 2023-10-03 | DRG: 853 | Disposition: A | Discharge status: Home / Self Care | Attending: Internal Medicine | Admitting: Internal Medicine

## 2023-09-30 DIAGNOSIS — E7800 Pure hypercholesterolemia, unspecified: Secondary | ICD-10-CM | POA: Diagnosis present

## 2023-09-30 DIAGNOSIS — J449 Chronic obstructive pulmonary disease, unspecified: Secondary | ICD-10-CM | POA: Diagnosis present

## 2023-09-30 DIAGNOSIS — I2510 Atherosclerotic heart disease of native coronary artery without angina pectoris: Secondary | ICD-10-CM | POA: Diagnosis present

## 2023-09-30 DIAGNOSIS — A4150 Gram-negative sepsis, unspecified: Principal | ICD-10-CM | POA: Diagnosis present

## 2023-09-30 DIAGNOSIS — E66813 Obesity, class 3: Secondary | ICD-10-CM | POA: Diagnosis present

## 2023-09-30 DIAGNOSIS — N111 Chronic obstructive pyelonephritis: Secondary | ICD-10-CM | POA: Diagnosis present

## 2023-09-30 DIAGNOSIS — R7989 Other specified abnormal findings of blood chemistry: Secondary | ICD-10-CM | POA: Diagnosis not present

## 2023-09-30 DIAGNOSIS — R6521 Severe sepsis with septic shock: Secondary | ICD-10-CM | POA: Diagnosis present

## 2023-09-30 DIAGNOSIS — N136 Pyonephrosis: Secondary | ICD-10-CM | POA: Diagnosis present

## 2023-09-30 DIAGNOSIS — N390 Urinary tract infection, site not specified: Secondary | ICD-10-CM | POA: Diagnosis not present

## 2023-09-30 DIAGNOSIS — I10 Essential (primary) hypertension: Secondary | ICD-10-CM | POA: Diagnosis present

## 2023-09-30 DIAGNOSIS — Z888 Allergy status to other drugs, medicaments and biological substances status: Secondary | ICD-10-CM

## 2023-09-30 DIAGNOSIS — N201 Calculus of ureter: Secondary | ICD-10-CM | POA: Insufficient documentation

## 2023-09-30 DIAGNOSIS — N179 Acute kidney failure, unspecified: Secondary | ICD-10-CM | POA: Diagnosis present

## 2023-09-30 DIAGNOSIS — I459 Conduction disorder, unspecified: Secondary | ICD-10-CM | POA: Diagnosis present

## 2023-09-30 DIAGNOSIS — A419 Sepsis, unspecified organism: Secondary | ICD-10-CM | POA: Diagnosis present

## 2023-09-30 DIAGNOSIS — Z955 Presence of coronary angioplasty implant and graft: Secondary | ICD-10-CM

## 2023-09-30 DIAGNOSIS — I110 Hypertensive heart disease with heart failure: Secondary | ICD-10-CM | POA: Insufficient documentation

## 2023-09-30 DIAGNOSIS — I2585 Chronic coronary microvascular dysfunction: Secondary | ICD-10-CM

## 2023-09-30 DIAGNOSIS — I252 Old myocardial infarction: Secondary | ICD-10-CM | POA: Diagnosis not present

## 2023-09-30 DIAGNOSIS — R718 Other abnormality of red blood cells: Secondary | ICD-10-CM | POA: Diagnosis present

## 2023-09-30 DIAGNOSIS — N200 Calculus of kidney: Secondary | ICD-10-CM | POA: Diagnosis present

## 2023-09-30 DIAGNOSIS — I5031 Acute diastolic (congestive) heart failure: Secondary | ICD-10-CM | POA: Insufficient documentation

## 2023-09-30 DIAGNOSIS — Z823 Family history of stroke: Secondary | ICD-10-CM

## 2023-09-30 DIAGNOSIS — B964 Proteus (mirabilis) (morganii) as the cause of diseases classified elsewhere: Secondary | ICD-10-CM | POA: Diagnosis present

## 2023-09-30 DIAGNOSIS — J9601 Acute respiratory failure with hypoxia: Secondary | ICD-10-CM | POA: Diagnosis not present

## 2023-09-30 DIAGNOSIS — B952 Enterococcus as the cause of diseases classified elsewhere: Secondary | ICD-10-CM | POA: Diagnosis present

## 2023-09-30 DIAGNOSIS — J9621 Acute and chronic respiratory failure with hypoxia: Secondary | ICD-10-CM | POA: Diagnosis present

## 2023-09-30 DIAGNOSIS — Z716 Tobacco abuse counseling: Secondary | ICD-10-CM

## 2023-09-30 DIAGNOSIS — N400 Enlarged prostate without lower urinary tract symptoms: Secondary | ICD-10-CM | POA: Diagnosis present

## 2023-09-30 DIAGNOSIS — Z83719 Family history of colon polyps, unspecified: Secondary | ICD-10-CM

## 2023-09-30 DIAGNOSIS — E871 Hypo-osmolality and hyponatremia: Secondary | ICD-10-CM | POA: Diagnosis not present

## 2023-09-30 DIAGNOSIS — E872 Acidosis, unspecified: Secondary | ICD-10-CM | POA: Diagnosis present

## 2023-09-30 DIAGNOSIS — I739 Peripheral vascular disease, unspecified: Secondary | ICD-10-CM | POA: Diagnosis present

## 2023-09-30 DIAGNOSIS — I723 Aneurysm of iliac artery: Secondary | ICD-10-CM | POA: Diagnosis present

## 2023-09-30 DIAGNOSIS — Z85038 Personal history of other malignant neoplasm of large intestine: Secondary | ICD-10-CM

## 2023-09-30 DIAGNOSIS — Z95828 Presence of other vascular implants and grafts: Secondary | ICD-10-CM

## 2023-09-30 DIAGNOSIS — F17210 Nicotine dependence, cigarettes, uncomplicated: Secondary | ICD-10-CM | POA: Diagnosis present

## 2023-09-30 DIAGNOSIS — Z720 Tobacco use: Secondary | ICD-10-CM | POA: Diagnosis not present

## 2023-09-30 DIAGNOSIS — Z6841 Body Mass Index (BMI) 40.0 and over, adult: Secondary | ICD-10-CM

## 2023-09-30 DIAGNOSIS — Z87442 Personal history of urinary calculi: Secondary | ICD-10-CM

## 2023-09-30 DIAGNOSIS — Z79899 Other long term (current) drug therapy: Secondary | ICD-10-CM

## 2023-09-30 DIAGNOSIS — Z951 Presence of aortocoronary bypass graft: Secondary | ICD-10-CM | POA: Insufficient documentation

## 2023-09-30 DIAGNOSIS — Z833 Family history of diabetes mellitus: Secondary | ICD-10-CM

## 2023-09-30 DIAGNOSIS — Z8679 Personal history of other diseases of the circulatory system: Secondary | ICD-10-CM

## 2023-09-30 DIAGNOSIS — Z7982 Long term (current) use of aspirin: Secondary | ICD-10-CM | POA: Diagnosis not present

## 2023-09-30 LAB — COMPREHENSIVE METABOLIC PANEL WITH GFR
ALT: 38 U/L (ref 0–44)
AST: 39 U/L (ref 15–41)
Albumin: 3.4 g/dL — ABNORMAL LOW (ref 3.5–5.0)
Alkaline Phosphatase: 114 U/L (ref 38–126)
Anion gap: 14 (ref 5–15)
BUN: 14 mg/dL (ref 6–20)
CO2: 25 mmol/L (ref 22–32)
Calcium: 8.6 mg/dL — ABNORMAL LOW (ref 8.9–10.3)
Chloride: 98 mmol/L (ref 98–111)
Creatinine, Ser: 1.4 mg/dL — ABNORMAL HIGH (ref 0.61–1.24)
GFR, Estimated: 58 mL/min — ABNORMAL LOW (ref >60)
Glucose, Bld: 123 mg/dL — ABNORMAL HIGH (ref 70–99)
Potassium: 4 mmol/L (ref 3.5–5.1)
Sodium: 137 mmol/L (ref 135–145)
Total Bilirubin: 0.8 mg/dL (ref 0.0–1.2)
Total Protein: 7.8 g/dL (ref 6.5–8.1)

## 2023-09-30 LAB — URINALYSIS, W/ REFLEX TO CULTURE (INFECTION SUSPECTED)
Bilirubin Urine: NEGATIVE
Glucose, UA: NEGATIVE mg/dL
Ketones, ur: NEGATIVE mg/dL
Nitrite: POSITIVE — AB
Protein, ur: 30 mg/dL — AB
Specific Gravity, Urine: 1.016 (ref 1.005–1.030)
WBC, UA: >50 WBC/hpf (ref 0–5)
pH: 6 (ref 5.0–8.0)

## 2023-09-30 LAB — BASIC METABOLIC PANEL WITH GFR
Anion gap: 9 (ref 5–15)
BUN: 14 mg/dL (ref 6–20)
CO2: 27 mmol/L (ref 22–32)
Calcium: 8.8 mg/dL — ABNORMAL LOW (ref 8.9–10.3)
Chloride: 101 mmol/L (ref 98–111)
Creatinine, Ser: 1.41 mg/dL — ABNORMAL HIGH (ref 0.61–1.24)
GFR, Estimated: 57 mL/min — ABNORMAL LOW (ref >60)
Glucose, Bld: 117 mg/dL — ABNORMAL HIGH (ref 70–99)
Potassium: 4.2 mmol/L (ref 3.5–5.1)
Sodium: 137 mmol/L (ref 135–145)

## 2023-09-30 LAB — CBC
HCT: 46.3 % (ref 39.0–52.0)
Hemoglobin: 14.8 g/dL (ref 13.0–17.0)
MCH: 33.2 pg (ref 26.0–34.0)
MCHC: 32 g/dL (ref 30.0–36.0)
MCV: 103.8 fL — ABNORMAL HIGH (ref 80.0–100.0)
Platelets: 271 10*3/uL (ref 150–400)
RBC: 4.46 MIL/uL (ref 4.22–5.81)
RDW: 13.3 % (ref 11.5–15.5)
WBC: 11.3 10*3/uL — ABNORMAL HIGH (ref 4.0–10.5)
nRBC: 0 % (ref 0.0–0.2)

## 2023-09-30 LAB — PROTIME-INR
INR: 1 (ref 0.8–1.2)
Prothrombin Time: 13.3 s (ref 11.4–15.2)

## 2023-09-30 LAB — LACTIC ACID, PLASMA
Lactic Acid, Venous: 3.6 mmol/L (ref 0.5–1.9)
Lactic Acid, Venous: 2.4 mmol/L (ref 0.5–1.9)

## 2023-09-30 MED ORDER — SODIUM CHLORIDE 0.9 % IV BOLUS
1000.0000 mL | Freq: Once | INTRAVENOUS | Status: AC
  Administered 2023-09-30: 1000 mL via INTRAVENOUS

## 2023-09-30 MED ORDER — IPRATROPIUM-ALBUTEROL 0.5-2.5 (3) MG/3ML IN SOLN
3.0000 mL | RESPIRATORY_TRACT | Status: AC
  Administered 2023-09-30 (×3): 3 mL via RESPIRATORY_TRACT
  Filled 2023-09-30: qty 9

## 2023-09-30 MED ORDER — LACTATED RINGERS IV SOLN
INTRAVENOUS | Status: AC
  Administered 2023-09-30: 1000 mL via INTRAVENOUS

## 2023-09-30 MED ORDER — ACETAMINOPHEN 325 MG PO TABS
650.0000 mg | ORAL_TABLET | Freq: Once | ORAL | Status: AC
  Administered 2023-09-30: 650 mg via ORAL
  Filled 2023-09-30: qty 2

## 2023-09-30 MED ORDER — MORPHINE SULFATE (PF) 4 MG/ML IV SOLN
4.0000 mg | Freq: Once | INTRAVENOUS | Status: AC
  Administered 2023-09-30: 4 mg via INTRAVENOUS
  Filled 2023-09-30: qty 1

## 2023-09-30 MED ORDER — SODIUM CHLORIDE 0.9 % IV SOLN
2.0000 g | Freq: Once | INTRAVENOUS | Status: AC
  Administered 2023-09-30: 2 g via INTRAVENOUS
  Filled 2023-09-30: qty 20

## 2023-09-30 MED ORDER — HYDROMORPHONE HCL 1 MG/ML IJ SOLN
0.5000 mg | INTRAMUSCULAR | Status: DC | PRN
  Administered 2023-09-30 (×2): 0.5 mg via INTRAVENOUS
  Filled 2023-09-30 (×2): qty 0.5

## 2023-09-30 NOTE — Progress Notes (Signed)
 Pt being followed by [facility] for Sepsis protocol.

## 2023-09-30 NOTE — ED Notes (Signed)
 Pt ambulated to restroom.

## 2023-09-30 NOTE — ED Notes (Signed)
 Pt back from restroom. SOB after walking placed on 3L.

## 2023-09-30 NOTE — Progress Notes (Signed)
 CODE SEPSIS - PHARMACY COMMUNICATION  **Broad Spectrum Antibiotics should be administered within 1 hour of Sepsis diagnosis**  Time Code Sepsis Called/Page Received: 1952  Antibiotics Ordered: ceftriaxone   Time of 1st antibiotic administration: 2018  Additional action taken by pharmacy: N/A  If necessary, Name of Provider/Nurse Contacted: N/A    Alice Innocent ,PharmD Clinical Pharmacist  09/30/2023  8:05 PM

## 2023-09-30 NOTE — ED Notes (Incomplete)
 Pt back from restroom. SOB after walking placed on

## 2023-09-30 NOTE — ED Notes (Signed)
 Pt stated, "pain keeps moving from the left and right side."

## 2023-09-30 NOTE — H&P (Incomplete)
 History and Physical    Patient: Vincent Griffin:[id number] DOB: [birth year] DOA: 09/30/2023 DOS: the patient was seen and examined on 10/01/2023 PCP: Yevette Hem, FNP  Patient coming from: Home  Chief Complaint:  Chief Complaint  Patient presents with   Flank Pain   HPI: [name] is a 63 y.o. male with medical history significant of hypertension, CAD s/p stent placement, COPD, tobacco abuse, PAD, nephrolithiasis, colon cancer s/p robotic assisted laparoscopic right hemicolectomy on 10/07/2022 who presents to the emergency department due to left flank pain which started around 1:30 PM today, pain radiates to lower back and left groin and was rated as 10/10 on pain scale.  Pain was similar to prior kidney stone.  He complained of increased urinary frequency which started yesterday.  Patient denies nausea, vomiting, chest pain.  He was noted with shortness of breath and endorsed daily smoking.  ED Course:  In the emergency department, he was febrile, tachypneic, tachycardic, O2 sat was 81% on room air and was provided with supplemental oxygen at 2 LPM with improved O2 sat to 90-96%.  Workup in the ED showed normal CBC except for WBC of 11.3 and MCV of 103.8.  BMP was normal except for blood glucose of 117, creatinine 1.41 (baseline creatinine at 1.0-1.1).  Blood culture pending.  Urinalysis was ordered for UTI.  Lactic acid 3.6 > 2.4 Chest x-ray showed stable appearance when compared with the prior exam CT abdomen pelvis without contrast showed interval migration of an 8 mm stone from the left kidney into the proximal left ureter with obstructive change. She was treated with IV ceftriaxone , Tylenol  was given due to fever, breathing treatment was provided due to shortness of breath, IV hydration was provided, IV morphine  4 mg x 1 was given due to pain. Urologist (Dr. Jarvis Mesa) was consulted and recommended admitting patient to hospitalist service at [facility] with plan to stent patient on  arrival to [facility].  To [facility].  [facility] was asked to admit patient  Review of Systems: Review of systems as noted in the HPI. All other systems reviewed and are negative.   Past Medical History:  Diagnosis Date   CAD (coronary artery disease)    Cancer (HCC)    colon   History of kidney stones    Hypercholesterolemia    Hypertension    Iliac artery aneurysm, right (HCC)    Myocardial infarction (HCC) 2022   Peripheral vascular disease (HCC)    Past Surgical History:  Procedure Laterality Date   ABDOMINAL AORTIC ENDOVASCULAR STENT GRAFT N/A 09/08/2023   Procedure: INSERTION, ENDOVASCULAR STENT GRAFT, AORTA, ABDOMINAL;  Surgeon: [name], MD;  Location: [facility] OR;  Service: Vascular;  Laterality: N/A;   ANEURYSM COILING Left 09/08/2023   Procedure: EMBOLIZATION, USING COIL, OF RIGHT INTERNAL ILIAC;  Surgeon: [name], MD;  Location: [facility] OR;  Service: Vascular;  Laterality: Left;   APPENDECTOMY     BIOPSY  09/08/2022   Procedure: BIOPSY;  Surgeon: Vinetta Greening, DO;  Location: [facility] ENDO SUITE;  Service: Endoscopy;;   BIOPSY  04/10/2023   Procedure: BIOPSY;  Surgeon: Vinetta Greening, DO;  Location: [facility] END[facility] SUITE;  Service: Endoscopy;;   CARDIAC CATHETERIZATION  07/2020   with stent.   CATARACT EXTRACTION     CATARACT EXTRACTION W/PHACO Left 01/07/2016   Procedure: CATARACT EXTRACTION PHACO AND INTRAOCULAR LENS PLACEMENT LEFT EYE CDE=13.32;  Surgeon: [name], MD;  Location: [facility] ORS;  Service: Ophthalmology;  Laterality: Left;  COLONOSCOPY WITH PROPOFOL  N/A 09/08/2022   Procedure: COLONOSCOPY WITH PROPOFOL ;  Surgeon: Vinetta Greening, DO;  Location: [facility] END[facility];  Service: Endoscopy;  Laterality: N/A;  11:30 am   COLONOSCOPY WITH PROPOFOL  N/A 04/10/2023   Procedure: COLONOSCOPY WITH PROPOFOL ;  Surgeon: Vinetta Greening, DO;  Location: [facility];  Service: Endoscopy;  Laterality: N/A;  100pm, asa 3   POLYPECTOMY  09/08/2022   Procedure: POLYPECTOMY;  Surgeon:  Vinetta Greening, DO;  Location: [facility];  Service: Endoscopy;;   POLYPECTOMY  04/10/2023   Procedure: POLYPECTOMY;  Surgeon: Vinetta Greening, DO;  Location: [facility];  Service: Endoscopy;;   ROBOT ASSISTED LAPAROSCOPIC PARTIAL COLECTOMY  10/07/2022   SMALL INTESTINE SURGERY  1972   SUBMUCOSAL TATTOO INJECTION  09/08/2022   Procedure: SUBMUCOSAL TATTOO INJECTION;  Surgeon: Vinetta Greening, DO;  Location: [facility];  Service: Endoscopy;;    Social History:  reports that he has been smoking cigarettes. He has never used smokeless tobacco. He reports that he does not drink alcohol and does not use drugs.   Allergies  Allergen Reactions   Zetia [Ezetimibe] Diarrhea and Nausea Only    Upset stomach    Family History  Problem Relation Age of Onset   Diabetes Mother    Stroke Mother    Colon polyps Brother    Colon cancer Neg Hx      Prior to Admission medications   Medication Sig Start Date End Date Taking? Authorizing Provider  albuterol  (VENTOLIN  HFA) 108 (90 Base) MCG/ACT inhaler Inhale 2 puffs into the lungs every 6 (six) hours as needed for wheezing or shortness of breath. 08/04/23   Milian, Winda Hastings, FNP  aspirin  81 MG chewable tablet Chew 81 mg by mouth daily. 08/13/20   [provider]  atorvastatin  (LIPITOR) 80 MG tablet Take 80 mg by mouth at bedtime. 12/09/20   [provider]  Budeson-Glycopyrrol-Formoterol  (BREZTRI  AEROSPHERE) 160-9-4.8 MCG/ACT AERO Inhale 2 puffs into the lungs 2 (two) times daily. 07/18/23   Tommas Fragmin A, FNP  Ferrous Sulfate (IRON PO) Take by mouth. Takes  1 -2 iron tablets per week Patient not taking: Reported on 09/13/2023    [provider]  hydrocortisone  (ANUSOL -HC) 2.5 % rectal cream Place 1 Application rectally 2 (two) times daily. 04/05/23   Delman Ferns, NP  metoprolol  succinate (TOPROL -XL) 25 MG 24 hr tablet Take 25 mg by mouth daily. 12/10/20   [provider]  nitroGLYCERIN   (NITROSTAT ) 0.4 MG SL tablet Place 0.4 mg under the tongue every 5 (five) minutes as needed for chest pain. 08/13/20   [provider]  oxyCODONE -acetaminophen  (PERCOCET) 5-325 MG tablet Take 1 tablet by mouth every 6 (six) hours as needed for severe pain (pain score 7-10). Patient not taking: Reported on 09/13/2023 09/09/23   Bonnye Butts, PA-C    Physical Exam: BP 118/63   Pulse (!) 101   Temp 100.3 F (37.9 C) (Oral)   Resp (!) 26   Ht 5\' 10"  (1.778 m)   Wt (!) 140.6 kg   SpO2 94%   BMI 44.48 kg/m   General: 63 y.o. year-old male well developed well nourished in no acute distress.  Alert and oriented x3. HEENT: NCAT, EOMI Neck: Supple, trachea medial Cardiovascular: Regular rate and rhythm with no rubs or gallops.  No thyromegaly or JVD noted.  No lower extremity edema. 2/4 pulses in all 4 extremities. Respiratory: Scattered wheeze on auscultation.  No rales.  Abdomen: Left CVA tenderness.  Soft, nontender nondistended with normal bowel sounds x4 quadrants. Muskuloskeletal: No cyanosis, clubbing or edema noted bilaterally Neuro: CN II-XII intact, strength 5/5 x 4, sensation, reflexes intact Skin: No ulcerative lesions noted or rashes Psychiatry: Judgement and insight appear normal. Mood is appropriate for condition and setting          Labs on Admission:  Basic Metabolic Panel: Recent Labs  Lab 09/30/23 1745 09/30/23 1803  NA 137 137  K 4.2 4.0  CL 101 98  CO2 27 25  GLUCOSE 117* 123*  BUN 14 14  CREATININE 1.41* 1.40*  CALCIUM  8.8* 8.6*   Liver Function Tests: Recent Labs  Lab 09/30/23 1803  AST 39  ALT 38  ALKPHOS 114  BILITOT 0.8  PROT 7.8  ALBUMIN  3.4*   No results for input(s): "LIPASE", "AMYLASE" in the last 168 hours. No results for input(s): "AMMONIA" in the last 168 hours. CBC: Recent Labs  Lab 09/30/23 1745  WBC 11.3*  HGB 14.8  HCT 46.3  MCV 103.8*  PLT 271   Cardiac Enzymes: No results for input(s): "CKTOTAL", "CKMB",  "CKMBINDEX", "TROPONINI" in the last 168 hours.  BNP (last 3 results) No results for input(s): "BNP" in the last 8760 hours.  ProBNP (last 3 results) No results for input(s): "PROBNP" in the last 8760 hours.  CBG: No results for input(s): "GLUCAP" in the last 168 hours.  Radiological Exams on Admission: DG Chest Port 1 View Result Date: 09/30/2023 CLINICAL DATA:  Possible sepsis EXAM: PORTABLE CHEST 1 VIEW COMPARISON:  09/08/2023 FINDINGS: Cardiac shadow is enlarged. Mild aortic calcifications are seen. Persistent interstitial opacities are noted stable from the prior exam. No focal confluent infiltrate is seen. No bony abnormality is noted. IMPRESSION: Stable appearance when compared with the prior exam. Electronically Signed   By: Violeta Grey M.D.   On: 09/30/2023 19:46   CT Renal Stone Study Result Date: 09/30/2023 CLINICAL DATA:  Left flank pain EXAM: CT ABDOMEN AND PELVIS WITHOUT CONTRAST TECHNIQUE: Multidetector CT imaging of the abdomen and pelvis was performed following the standard protocol without IV contrast. RADIATION DOSE REDUCTION: This exam was performed according to the departmental dose-optimization program which includes automated exposure control, adjustment of the mA and/or kV according to patient size and/or use of iterative reconstruction technique. COMPARISON:  09/27/2023 FINDINGS: Lower chest: Lung bases are free of acute infiltrate or sizable effusion. Hepatobiliary: No focal liver abnormality is seen. No gallstones, gallbladder wall thickening, or biliary dilatation. Pancreas: Unremarkable. No pancreatic ductal dilatation or surrounding inflammatory changes. Spleen: Normal in size without focal abnormality. Adrenals/Urinary Tract: Adrenal glands are within normal limits. Nonobstructing right renal calculi noted and stable. The right ureter is within normal limits. Left kidney demonstrates significant perinephric stranding with migration of 1 of the upper pole stones into  the proximal left ureter. This calculus measures up to 8 mm. Small punctate nonobstructing stones remain in the left collecting system. The distal left ureter is within normal limits. Bladder is decompressed. Stomach/Bowel: Diverticulosis without diverticulitis is noted. Postsurgical changes in the right colon are seen. No obstructive changes are noted. The appendix has been surgically. Small bowel and stomach are unremarkable. Vascular/Lymphatic: Atherosclerotic calcifications of the aorta are noted. Aortic stent graft is seen and stable from the prior exam. No lymphadenopathy is noted. Reproductive: Prostate is unremarkable. Other: No abdominal wall hernia or abnormality. No abdominopelvic ascites. Musculoskeletal: No acute or significant osseous findings. IMPRESSION: Interval migration of an 8 mm stone from the  left kidney into the proximal left ureter with obstructive change. Bilateral nonobstructing renal calculi stable from the prior exam. Diverticulosis without diverticulitis. Prior endovascular stent graft therapy of the abdominal aorta. No aneurysmal dilatation is seen. 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 Electronically Signed   By: Violeta Grey M.D.   On: 09/30/2023 19:45    EKG: I independently viewed the EKG done and my findings are as followed: Normal sinus rhythm with rate of 99 bpm  Assessment/Plan Present on Admission:  Sepsis secondary to UTI (HCC)  COPD (chronic obstructive pulmonary disease) (HCC)  Tobacco abuse  Principal Problem:   Sepsis secondary to UTI [facility]) Active Problems:   Tobacco abuse   COPD (chronic obstructive pulmonary disease) (HCC)   Lactic acidosis   Nephrolithiasis   Acute respiratory failure with hypoxia (HCC)   AKI (acute kidney injury) (HCC)   Elevated MCV   Obesity, Class III, BMI 40-49.9 (morbid obesity)  Severe sepsis secondary to UTI Patient was febrile and tachycardic (met SIRS criteria), urological follow-up for  UTI.  Lactic acid was 3.6, so patient met criteria for severe sepsis He was treated with IV ceftriaxone , with hypotension at this time Continue IV hydration  Lactic acidosis Lactic acid 3.6 > 2.4 Continue IV hydration Continue to trend lactic acid  Nephrolithiasis CT abdomen and pelvis showed  8 mm stone from the left kidney into the proximal left ureter with obstructive change. Continue IV hydration Continue IV fentanyl  12.5 mcg every 2 hours as needed as needed for moderate to severe pain Urologist (Dr. Jarvis Mesa) was consulted and recommended admitting patient to [facility] for possible stent placement.  Acute respiratory failure with hypoxia COPD  Continue duo nebs, Mucinex ,  Continue incentive spirometry  Continue supplemental oxygen to maintain O2 sat > 94% with plan to wean patient off oxygen as tolerated  Acute kidney injury Creatinine 1.41 (baseline creatinine at 1.0-1.1) Continue gentle hydration Renally adjust medications, avoid nephrotoxic agents/dehydration/hypotension  Elevated BNP BNP 258 Chest x-ray showed stable appearance when compared with the prior exam CT abdomen and pelvis without contrast showed lung bases are free of acute infiltrate or sizable effusion Elevated BNP may be due to increased creatinine Continue total input/output, daily weights and fluid restriction Echocardiogram will be checked in the morning to rule out CHF  Elevated MCV MCV 103.8, vitamin B12 and folate levels will be checked  Tobacco abuse Patient was counseled on tobacco cessation  Obesity class III (BMI of 44.48) Patient was counseled about the cardiovascular and metabolic risk of morbid obesity. Patient was counseled for diet control, exercise regimen and weight loss.     DVT prophylaxis: SCDs  Code Status: Full code  Family Communication: Wife at bedside (all questions answered to satisfaction)  Consults: Urology (by [facility] ED PA)   Severity of Illness: The appropriate patient  status for this patient is INPATIENT. Inpatient status is judged to be reasonable and necessary in order to provide the required intensity of service to ensure the patient's safety. The patient's presenting symptoms, physical exam findings, and initial radiographic and laboratory data in the context of their chronic comorbidities is felt to place them at high risk for further clinical deterioration. Furthermore, it is not anticipated that the patient will be medically stable for discharge from the hospital within 2 midnights of admission.   * I certify that at the point of admission it is my clinical judgment that the patient will require inpatient hospital care spanning beyond 2 midnights  from the point of admission due to high intensity of service, high risk for further deterioration and high frequency of surveillance required.*  Author: Romney Compean, DO 10/01/2023 1:22 AM  For on call review [email or web address].

## 2023-09-30 NOTE — ED Triage Notes (Signed)
 Pt to ER states pain to middle of back around to "my left kidney."  Denies known injury.  States increased urination this week. Pt states pain started around 1:30 pm states pain is getting worse.

## 2023-09-30 NOTE — ED Provider Notes (Signed)
 [place] EMERGENCY DEPARTMENT AT [facility] Provider Note   CSN: [id number] Arrival date & time: 09/30/23  1658     History  Chief Complaint  Patient presents with   Flank Pain    Vincent Griffin is a 63 y.o. male.  Patient with history of CAD, COPD, kidney stones, hypertension, hyperlipidemia, MI presents today with complaints of flank pain.  States the pain is in his left flank and started today.  Feels like kidney stones he has had previously.  Denies nausea, vomiting, or diarrhea.  No abdominal pain.  He was unaware that he was running a fever.  Denies any chest pain, is notably short of breath, however patient repeatedly states "its because I am cold."  States he is short of breath at baseline.  He is not on oxygen normally. He does still smoke daily.  The history is provided by the patient. No language interpreter was used.  Flank Pain Associated symptoms include shortness of breath.       Home Medications Prior to Admission medications   Medication Sig Start Date End Date Taking? Authorizing Provider  albuterol  (VENTOLIN  HFA) 108 (90 Base) MCG/ACT inhaler Inhale 2 puffs into the lungs every 6 (six) hours as needed for wheezing or shortness of breath. 08/04/23   Milian, Winda Hastings, FNP  aspirin  81 MG chewable tablet Chew 81 mg by mouth daily. 08/13/20   [provider]  atorvastatin  (LIPITOR) 80 MG tablet Take 80 mg by mouth at bedtime. 12/09/20   [provider]  Budeson-Glycopyrrol-Formoterol  (BREZTRI  AEROSPHERE) 160-9-4.8 MCG/ACT AERO Inhale 2 puffs into the lungs 2 (two) times daily. 07/18/23   Tommas Fragmin A, FNP  Ferrous Sulfate (IRON PO) Take by mouth. Takes  1 -2 iron tablets per week Patient not taking: Reported on 09/13/2023    [provider]  hydrocortisone  (ANUSOL -HC) 2.5 % rectal cream Place 1 Application rectally 2 (two) times daily. 04/05/23   Delman Ferns, NP  metoprolol  succinate (TOPROL -XL) 25 MG 24 hr tablet Take 25  mg by mouth daily. 12/10/20   [provider]  nitroGLYCERIN  (NITROSTAT ) 0.4 MG SL tablet Place 0.4 mg under the tongue every 5 (five) minutes as needed for chest pain. 08/13/20   [provider]  oxyCODONE -acetaminophen  (PERCOCET) 5-325 MG tablet Take 1 tablet by mouth every 6 (six) hours as needed for severe pain (pain score 7-10). Patient not taking: Reported on 09/13/2023 09/09/23   Bonnye Butts, PA-C      Allergies    Zetia [ezetimibe]    Review of Systems   Review of Systems  Respiratory:  Positive for shortness of breath and wheezing.   Genitourinary:  Positive for flank pain.  All other systems reviewed and are negative.   Physical Exam Updated Vital Signs BP 118/63   Pulse (!) 101   Temp (!) 102.2 F (39 C) (Oral)   Resp (!) 26   Ht 5\' 10"  (1.778 m)   Wt (!) 140.6 kg   SpO2 94%   BMI 44.48 kg/m  Physical Exam Vitals and nursing note reviewed.  Constitutional:      General: He is not in acute distress.    Appearance: Normal appearance. He is normal weight. He is not ill-appearing, toxic-appearing or diaphoretic.  HENT:     Head: Normocephalic and atraumatic.  Cardiovascular:     Rate and Rhythm: Normal rate and regular rhythm.     Heart sounds: Normal heart sounds.  Pulmonary:  Effort: Pulmonary effort is normal. No respiratory distress.     Breath sounds: Wheezing present.  Abdominal:     General: Abdomen is flat.     Palpations: Abdomen is soft.     Tenderness: There is no abdominal tenderness. There is left CVA tenderness.  Musculoskeletal:        General: Normal range of motion.     Cervical back: Normal range of motion.  Skin:    General: Skin is warm and dry.  Neurological:     General: No focal deficit present.     Mental Status: He is alert.  Psychiatric:        Mood and Affect: Mood normal.        Behavior: Behavior normal.     ED Results / Procedures / Treatments   Labs (all labs ordered are listed, but only abnormal  results are displayed) Labs Reviewed  BASIC METABOLIC PANEL WITH GFR - Abnormal; Notable for the following components:      Result Value   Glucose, Bld 117 (*)    Creatinine, Ser 1.41 (*)    Calcium  8.8 (*)    GFR, Estimated 57 (*)    All other components within normal limits  CBC - Abnormal; Notable for the following components:   WBC 11.3 (*)    MCV 103.8 (*)    All other components within normal limits  LACTIC ACID, PLASMA - Abnormal; Notable for the following components:   Lactic Acid, Venous 3.6 (*)    All other components within normal limits  URINALYSIS, W/ REFLEX TO CULTURE (INFECTION SUSPECTED) - Abnormal; Notable for the following components:   APPearance HAZY (*)    Hgb urine dipstick MODERATE (*)    Protein, ur 30 (*)    Nitrite POSITIVE (*)    Leukocytes,Ua MODERATE (*)    Bacteria, UA RARE (*)    Crystals PRESENT (*)    All other components within normal limits  CULTURE, BLOOD (ROUTINE X 2)  CULTURE, BLOOD (ROUTINE X 2)  URINE CULTURE  PROTIME-INR  URINALYSIS, ROUTINE W REFLEX MICROSCOPIC  LACTIC ACID, PLASMA  COMPREHENSIVE METABOLIC PANEL WITH GFR    EKG None  Radiology DG Chest Port 1 View Result Date: 09/30/2023 CLINICAL DATA:  Possible sepsis EXAM: PORTABLE CHEST 1 VIEW COMPARISON:  09/08/2023 FINDINGS: Cardiac shadow is enlarged. Mild aortic calcifications are seen. Persistent interstitial opacities are noted stable from the prior exam. No focal confluent infiltrate is seen. No bony abnormality is noted. IMPRESSION: Stable appearance when compared with the prior exam. Electronically Signed   By: Violeta Grey M.D.   On: 09/30/2023 19:46   CT Renal Stone Study Result Date: 09/30/2023 CLINICAL DATA:  Left flank pain EXAM: CT ABDOMEN AND PELVIS WITHOUT CONTRAST TECHNIQUE: Multidetector CT imaging of the abdomen and pelvis was performed following the standard protocol without IV contrast. RADIATION DOSE REDUCTION: This exam was performed according to the  departmental dose-optimization program which includes automated exposure control, adjustment of the mA and/or kV according to patient size and/or use of iterative reconstruction technique. COMPARISON:  09/27/2023 FINDINGS: Lower chest: Lung bases are free of acute infiltrate or sizable effusion. Hepatobiliary: No focal liver abnormality is seen. No gallstones, gallbladder wall thickening, or biliary dilatation. Pancreas: Unremarkable. No pancreatic ductal dilatation or surrounding inflammatory changes. Spleen: Normal in size without focal abnormality. Adrenals/Urinary Tract: Adrenal glands are within normal limits. Nonobstructing right renal calculi noted and stable. The right ureter is within normal limits. Left kidney demonstrates significant perinephric  stranding with migration of 1 of the upper pole stones into the proximal left ureter. This calculus measures up to 8 mm. Small punctate nonobstructing stones remain in the left collecting system. The distal left ureter is within normal limits. Bladder is decompressed. Stomach/Bowel: Diverticulosis without diverticulitis is noted. Postsurgical changes in the right colon are seen. No obstructive changes are noted. The appendix has been surgically. Small bowel and stomach are unremarkable. Vascular/Lymphatic: Atherosclerotic calcifications of the aorta are noted. Aortic stent graft is seen and stable from the prior exam. No lymphadenopathy is noted. Reproductive: Prostate is unremarkable. Other: No abdominal wall hernia or abnormality. No abdominopelvic ascites. Musculoskeletal: No acute or significant osseous findings. IMPRESSION: Interval migration of an 8 mm stone from the left kidney into the proximal left ureter with obstructive change. Bilateral nonobstructing renal calculi stable from the prior exam. Diverticulosis without diverticulitis. Prior endovascular stent graft therapy of the abdominal aorta. No aneurysmal dilatation is seen. 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 Electronically Signed   By: Violeta Grey M.D.   On: 09/30/2023 19:45    Procedures .Critical Care  Performed by: Sherra Dk, PA-C Authorized by: Dyami Umbach A, PA-C   Critical care provider statement:    Critical care time (minutes):  100   Critical care was necessary to treat or prevent imminent or life-threatening deterioration of the following conditions:  Sepsis and respiratory failure   Critical care was time spent personally by me on the following activities:  Development of treatment plan with patient or surrogate, discussions with consultants, discussions with primary provider, evaluation of patient's response to treatment, examination of patient, obtaining history from patient or surrogate, ordering and review of laboratory studies, ordering and review of radiographic studies, pulse oximetry, re-evaluation of patient's condition and review of old charts   Care discussed with: admitting provider       Medications Ordered in ED Medications  lactated ringers  infusion (1,000 mLs Intravenous New Bag/Given 09/30/23 2017)  HYDROmorphone  (DILAUDID ) injection 0.5 mg (has no administration in time range)  ipratropium-albuterol  (DUONEB) 0.5-2.5 (3) MG/3ML nebulizer solution 3 mL (3 mLs Nebulization Given 09/30/23 1945)  sodium chloride  0.9 % bolus 1,000 mL (0 mLs Intravenous Stopped 09/30/23 2104)  acetaminophen  (TYLENOL ) tablet 650 mg (650 mg Oral Given 09/30/23 1920)  cefTRIAXone  (ROCEPHIN ) 2 g in sodium chloride  0.9 % 100 mL IVPB (0 g Intravenous Stopped 09/30/23 2104)  morphine  (PF) 4 MG/ML injection 4 mg (4 mg Intravenous Given 09/30/23 2059)    ED Course/ Medical Decision Making/ A&P                                 Medical Decision Making Amount and/or Complexity of Data Reviewed Labs: ordered. Radiology: ordered.  Risk OTC drugs. Prescription drug management. Decision regarding hospitalization.   This patient is a 63  y.o. male who presents to the ED for concern of left flank pain, this involves an extensive number of treatment options, and is a complaint that carries with it a high risk of complications and morbidity. The emergent differential diagnosis prior to evaluation includes, but is not limited to,  AAA, renal vascular thrombosis, mesenteric ischemia, pyelonephritis, nephrolithiasis, cystitis, biliary colic, pancreatitis, PUD, appendicitis, diverticulitis, bowel obstruction, Testicular torsion, Epididymitis   This is not an exhaustive differential.   Past Medical History / Co-morbidities / Social History:  has a past medical history of CAD (coronary artery  disease), Cancer [facility]), History of kidney stones, Hypercholesterolemia, Hypertension, Iliac artery aneurysm, right (HCC), Myocardial infarction (HCC) (2022), and Peripheral vascular disease (HCC).  Additional history: Chart reviewed.  Physical Exam: Physical exam performed. The pertinent findings include: wheezing throughout, increased work of breathing noted.  Left flank tenderness.  Lab Tests: I ordered, and personally interpreted labs.  The pertinent results include:  WBC 11.3, creatinine 1.41 up from 1.01 3 weeks ago. Lactic 3.6. UA infectious   Imaging Studies: I ordered imaging studies including CXR, CT renal. I independently visualized and interpreted imaging which showed   CXR: Stable appearance when compared with the prior exam   CT:  Interval migration of an 8 mm stone from the left kidney into the proximal left ureter with obstructive change.   Bilateral nonobstructing renal calculi stable from the prior exam.   Diverticulosis without diverticulitis.   Prior endovascular stent graft therapy of the abdominal aorta. No aneurysmal dilatation is seen. 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   I agree with the radiologist interpretation.   Cardiac Monitoring:  The patient was maintained on a  cardiac monitor.  Cardiac monitor showed an underlying rhythm of: sinus rhythm. I agree with this interpretation.   Medications: I ordered medication including tylenol  for fever, duonebs for wheezing, morphine  for pain, rocephin  for urosepsis, fluids. Reevaluation of the patient after these medicines showed that the patient improved. I have reviewed the patients home medicines and have made adjustments as needed.  Consultations Obtained: I requested consultation with the urology on call Dr. Jarvis Mesa,  and discussed lab and imaging findings as well as pertinent plan - they recommend: hospitalist admission to [facility] for stenting   Disposition: After consideration of the diagnostic results and the patients response to treatment, I feel that patient will require admission for septic kidney stone.  Discussed same with patient is understanding and in agreement with this.  Discussed patient with hospitalist Dr. Adefeso who accepts patient for admission.  I discussed this case with my attending physician Dr. Annabell Key who cosigned this note including patient's presenting symptoms, physical exam, and planned diagnostics and interventions. Attending physician stated agreement with plan or made changes to plan which were implemented.    Final Clinical Impression(s) / ED Diagnoses Final diagnoses:  Kidney stone  Sepsis, due to unspecified organism, unspecified whether acute organ dysfunction present [facility])    Rx / DC Orders ED Discharge Orders     None         Fredna Jasper 09/30/23 2207    Early Glisson, MD 10/01/23 [phone]

## 2023-10-01 ENCOUNTER — Inpatient Hospital Stay (HOSPITAL_COMMUNITY)

## 2023-10-01 ENCOUNTER — Inpatient Hospital Stay (HOSPITAL_COMMUNITY): Admitting: Certified Registered Nurse Anesthetist

## 2023-10-01 ENCOUNTER — Encounter (HOSPITAL_COMMUNITY)
Admission: EM | Disposition: A | Discharge status: Home / Self Care | Payer: Self-pay | Source: Home / Self Care | Attending: Internal Medicine

## 2023-10-01 DIAGNOSIS — A419 Sepsis, unspecified organism: Secondary | ICD-10-CM | POA: Diagnosis not present

## 2023-10-01 DIAGNOSIS — E872 Acidosis, unspecified: Secondary | ICD-10-CM | POA: Diagnosis present

## 2023-10-01 DIAGNOSIS — N200 Calculus of kidney: Secondary | ICD-10-CM

## 2023-10-01 DIAGNOSIS — N390 Urinary tract infection, site not specified: Secondary | ICD-10-CM | POA: Diagnosis not present

## 2023-10-01 DIAGNOSIS — R718 Other abnormality of red blood cells: Secondary | ICD-10-CM | POA: Diagnosis present

## 2023-10-01 DIAGNOSIS — I5031 Acute diastolic (congestive) heart failure: Secondary | ICD-10-CM

## 2023-10-01 DIAGNOSIS — E66813 Obesity, class 3: Secondary | ICD-10-CM | POA: Diagnosis present

## 2023-10-01 DIAGNOSIS — J9601 Acute respiratory failure with hypoxia: Secondary | ICD-10-CM | POA: Diagnosis present

## 2023-10-01 DIAGNOSIS — N179 Acute kidney failure, unspecified: Secondary | ICD-10-CM | POA: Diagnosis present

## 2023-10-01 HISTORY — PX: CYSTOSCOPY W/ URETERAL STENT PLACEMENT: SHX1429

## 2023-10-01 LAB — MAGNESIUM: Magnesium: 1.5 mg/dL — ABNORMAL LOW (ref 1.7–2.4)

## 2023-10-01 LAB — CBC
HCT: 41.9 % (ref 39.0–52.0)
Hemoglobin: 13.3 g/dL (ref 13.0–17.0)
MCH: 33.3 pg (ref 26.0–34.0)
MCHC: 31.7 g/dL (ref 30.0–36.0)
MCV: 105 fL — ABNORMAL HIGH (ref 80.0–100.0)
Platelets: 158 10*3/uL (ref 150–400)
RBC: 3.99 MIL/uL — ABNORMAL LOW (ref 4.22–5.81)
RDW: 13.6 % (ref 11.5–15.5)
WBC: 12.7 10*3/uL — ABNORMAL HIGH (ref 4.0–10.5)
nRBC: 0 % (ref 0.0–0.2)

## 2023-10-01 LAB — BLOOD CULTURE ID PANEL (REFLEXED) - BCID2

## 2023-10-01 LAB — ECHOCARDIOGRAM COMPLETE
AR max vel: 2.45 cm2
AV Peak grad: 10.6 mmHg
Ao pk vel: 1.63 m/s
Area-P 1/2: 3.53 cm2
Height: 70 in
MV VTI: 4.2 cm2
S' Lateral: 4.1 cm
Weight: 4960 [oz_av]

## 2023-10-01 LAB — COMPREHENSIVE METABOLIC PANEL WITH GFR
ALT: 33 U/L (ref 0–44)
AST: 46 U/L — ABNORMAL HIGH (ref 15–41)
Albumin: 2.8 g/dL — ABNORMAL LOW (ref 3.5–5.0)
Alkaline Phosphatase: 77 U/L (ref 38–126)
Anion gap: 10 (ref 5–15)
BUN: 19 mg/dL (ref 6–20)
CO2: 22 mmol/L (ref 22–32)
Calcium: 7.9 mg/dL — ABNORMAL LOW (ref 8.9–10.3)
Chloride: 101 mmol/L (ref 98–111)
Creatinine, Ser: 2.08 mg/dL — ABNORMAL HIGH (ref 0.61–1.24)
GFR, Estimated: 36 mL/min — ABNORMAL LOW (ref >60)
Glucose, Bld: 112 mg/dL — ABNORMAL HIGH (ref 70–99)
Potassium: 4.2 mmol/L (ref 3.5–5.1)
Sodium: 133 mmol/L — ABNORMAL LOW (ref 135–145)
Total Bilirubin: 0.8 mg/dL (ref 0.0–1.2)
Total Protein: 6.6 g/dL (ref 6.5–8.1)

## 2023-10-01 LAB — PHOSPHORUS: Phosphorus: 3.1 mg/dL (ref 2.5–4.6)

## 2023-10-01 LAB — BRAIN NATRIURETIC PEPTIDE: B Natriuretic Peptide: 258 pg/mL — ABNORMAL HIGH (ref 0.0–100.0)

## 2023-10-01 LAB — VITAMIN B12: Vitamin B-12: 326 pg/mL (ref 180–914)

## 2023-10-01 LAB — LACTIC ACID, PLASMA
Lactic Acid, Venous: 3.6 mmol/L (ref 0.5–1.9)
Lactic Acid, Venous: 4.4 mmol/L (ref 0.5–1.9)
Lactic Acid, Venous: 3 mmol/L (ref 0.5–1.9)
Lactic Acid, Venous: 2.5 mmol/L (ref 0.5–1.9)

## 2023-10-01 LAB — HIV ANTIBODY (ROUTINE TESTING W REFLEX): HIV Screen 4th Generation wRfx: NONREACTIVE

## 2023-10-01 LAB — MRSA NEXT GEN BY PCR, NASAL: MRSA by PCR Next Gen: NOT DETECTED

## 2023-10-01 LAB — FOLATE: Folate: 7 ng/mL (ref >5.9)

## 2023-10-01 SURGERY — CYSTOSCOPY, WITH RETROGRADE PYELOGRAM AND URETERAL STENT INSERTION
Anesthesia: General | Site: Ureter | Laterality: Left

## 2023-10-01 MED ORDER — BISACODYL 10 MG RE SUPP: 10.0000 mg | Freq: Every day | RECTAL | Status: DC | PRN

## 2023-10-01 MED ORDER — FENTANYL CITRATE PF 50 MCG/ML IJ SOSY: 25.0000 ug | PREFILLED_SYRINGE | INTRAMUSCULAR | Status: DC | PRN

## 2023-10-01 MED ORDER — ALBUTEROL SULFATE (2.5 MG/3ML) 0.083% IN NEBU: 2.5000 mg | INHALATION_SOLUTION | RESPIRATORY_TRACT | Status: DC | PRN

## 2023-10-01 MED ORDER — SODIUM CHLORIDE 0.9% FLUSH: 3.0000 mL | INTRAVENOUS | Status: DC | PRN

## 2023-10-01 MED ORDER — SODIUM CHLORIDE 0.9 % IV SOLN
INTRAVENOUS | Status: AC
  Filled 2023-10-01: qty 20

## 2023-10-01 MED ORDER — LIDOCAINE HCL (CARDIAC) PF 100 MG/5ML IV SOSY
PREFILLED_SYRINGE | INTRAVENOUS | Status: DC | PRN
  Administered 2023-10-01: 60 mg via INTRAVENOUS

## 2023-10-01 MED ORDER — SODIUM CHLORIDE 0.9% FLUSH
3.0000 mL | Freq: Two times a day (BID) | INTRAVENOUS | Status: DC
  Administered 2023-10-01 – 2023-10-02 (×3): 3 mL via INTRAVENOUS

## 2023-10-01 MED ORDER — CEFAZOLIN SODIUM-DEXTROSE 2-4 GM/100ML-% IV SOLN
INTRAVENOUS | Status: AC
  Filled 2023-10-01: qty 100

## 2023-10-01 MED ORDER — SODIUM CHLORIDE 0.9 % IV SOLN
2.0000 g | Freq: Two times a day (BID) | INTRAVENOUS | Status: DC
  Administered 2023-10-01: 2 g via INTRAVENOUS
  Filled 2023-10-01: qty 12.5

## 2023-10-01 MED ORDER — EPHEDRINE 5 MG/ML INJ
INTRAVENOUS | Status: AC
  Filled 2023-10-01: qty 5

## 2023-10-01 MED ORDER — SODIUM CHLORIDE 0.9 % IV BOLUS
1000.0000 mL | Freq: Once | INTRAVENOUS | Status: AC
  Administered 2023-10-01: 1000 mL via INTRAVENOUS

## 2023-10-01 MED ORDER — BUDESON-GLYCOPYRROL-FORMOTEROL 160-9-4.8 MCG/ACT IN AERO
2.0000 | INHALATION_SPRAY | Freq: Two times a day (BID) | RESPIRATORY_TRACT | Status: DC
  Administered 2023-10-02: 2 via RESPIRATORY_TRACT
  Filled 2023-10-01 (×2): qty 5.9

## 2023-10-01 MED ORDER — MIDAZOLAM HCL 2 MG/2ML IJ SOLN
INTRAMUSCULAR | Status: AC
  Filled 2023-10-01: qty 2

## 2023-10-01 MED ORDER — WATER FOR IRRIGATION, STERILE IR SOLN
Status: DC | PRN
  Administered 2023-10-01: 3000 mL

## 2023-10-01 MED ORDER — EPHEDRINE SULFATE-NACL 50-0.9 MG/10ML-% IV SOSY
PREFILLED_SYRINGE | INTRAVENOUS | Status: DC | PRN
  Administered 2023-10-01: 5 mg via INTRAVENOUS
  Administered 2023-10-01: 10 mg via INTRAVENOUS

## 2023-10-01 MED ORDER — SODIUM CHLORIDE 0.9 % IV SOLN: 1.0000 g | INTRAVENOUS | Status: DC

## 2023-10-01 MED ORDER — SODIUM CHLORIDE 0.9 % IV SOLN: INTRAVENOUS | Status: AC | PRN

## 2023-10-01 MED ORDER — MIDAZOLAM HCL 5 MG/5ML IJ SOLN
INTRAMUSCULAR | Status: DC | PRN
  Administered 2023-10-01: 2 mg via INTRAVENOUS

## 2023-10-01 MED ORDER — FENTANYL CITRATE PF 50 MCG/ML IJ SOSY
12.5000 ug | PREFILLED_SYRINGE | INTRAMUSCULAR | Status: DC | PRN
  Administered 2023-10-01 – 2023-10-03 (×2): 12.5 ug via INTRAVENOUS
  Filled 2023-10-01 (×2): qty 1

## 2023-10-01 MED ORDER — ONDANSETRON HCL 4 MG/2ML IJ SOLN
4.0000 mg | Freq: Four times a day (QID) | INTRAMUSCULAR | Status: DC | PRN
  Administered 2023-10-01: 4 mg via INTRAVENOUS
  Filled 2023-10-01: qty 2

## 2023-10-01 MED ORDER — ASPIRIN 81 MG PO CHEW
81.0000 mg | CHEWABLE_TABLET | Freq: Every day | ORAL | Status: DC
  Administered 2023-10-01 – 2023-10-03 (×3): 81 mg via ORAL
  Filled 2023-10-01 (×3): qty 1

## 2023-10-01 MED ORDER — LACTATED RINGERS IV SOLN: INTRAVENOUS | Status: DC

## 2023-10-01 MED ORDER — IPRATROPIUM-ALBUTEROL 0.5-2.5 (3) MG/3ML IN SOLN
3.0000 mL | Freq: Three times a day (TID) | RESPIRATORY_TRACT | Status: DC
  Administered 2023-10-01 (×3): 3 mL via RESPIRATORY_TRACT
  Filled 2023-10-01 (×3): qty 3

## 2023-10-01 MED ORDER — OXYCODONE HCL 5 MG PO TABS: 5.0000 mg | ORAL_TABLET | Freq: Once | ORAL | Status: DC | PRN

## 2023-10-01 MED ORDER — FENTANYL CITRATE (PF) 100 MCG/2ML IJ SOLN
INTRAMUSCULAR | Status: AC
  Filled 2023-10-01: qty 2

## 2023-10-01 MED ORDER — FENTANYL CITRATE (PF) 100 MCG/2ML IJ SOLN
INTRAMUSCULAR | Status: DC | PRN
  Administered 2023-10-01 (×2): 50 ug via INTRAVENOUS

## 2023-10-01 MED ORDER — AMISULPRIDE (ANTIEMETIC) 5 MG/2ML IV SOLN: 10.0000 mg | Freq: Once | INTRAVENOUS | Status: DC | PRN

## 2023-10-01 MED ORDER — DM-GUAIFENESIN ER 30-600 MG PO TB12
1.0000 | ORAL_TABLET | Freq: Two times a day (BID) | ORAL | Status: DC
  Administered 2023-10-01 – 2023-10-03 (×6): 1 via ORAL
  Filled 2023-10-01 (×6): qty 1

## 2023-10-01 MED ORDER — OXYCODONE HCL 5 MG/5ML PO SOLN: 5.0000 mg | Freq: Once | ORAL | Status: DC | PRN

## 2023-10-01 MED ORDER — ATORVASTATIN CALCIUM 20 MG PO TABS
80.0000 mg | ORAL_TABLET | Freq: Every day | ORAL | Status: DC
  Administered 2023-10-01 – 2023-10-02 (×2): 80 mg via ORAL
  Filled 2023-10-01 (×2): qty 2

## 2023-10-01 MED ORDER — ONDANSETRON HCL 4 MG PO TABS: 4.0000 mg | ORAL_TABLET | Freq: Four times a day (QID) | ORAL | Status: DC | PRN

## 2023-10-01 MED ORDER — LACTATED RINGERS IV SOLN: INTRAVENOUS | Status: AC

## 2023-10-01 MED ORDER — ACETAMINOPHEN 650 MG RE SUPP: 650.0000 mg | Freq: Four times a day (QID) | RECTAL | Status: DC | PRN

## 2023-10-01 MED ORDER — PROPOFOL 10 MG/ML IV BOLUS
INTRAVENOUS | Status: DC | PRN
  Administered 2023-10-01: 100 mg via INTRAVENOUS

## 2023-10-01 MED ORDER — ONDANSETRON HCL 4 MG/2ML IJ SOLN
INTRAMUSCULAR | Status: AC
  Filled 2023-10-01: qty 2

## 2023-10-01 MED ORDER — CHLORHEXIDINE GLUCONATE CLOTH 2 % EX PADS
6.0000 | MEDICATED_PAD | Freq: Every day | CUTANEOUS | Status: DC
  Administered 2023-10-01: 6 via TOPICAL

## 2023-10-01 MED ORDER — SODIUM CHLORIDE 0.9 % IV SOLN
2.0000 g | INTRAVENOUS | Status: DC
  Administered 2023-10-01 – 2023-10-02 (×3): 2 g via INTRAVENOUS
  Filled 2023-10-01 (×2): qty 20

## 2023-10-01 MED ORDER — PERFLUTREN LIPID MICROSPHERE
1.0000 mL | INTRAVENOUS | Status: AC | PRN
  Administered 2023-10-01: 2 mL via INTRAVENOUS

## 2023-10-01 MED ORDER — POLYETHYLENE GLYCOL 3350 17 G PO PACK: 17.0000 g | PACK | Freq: Every day | ORAL | Status: DC | PRN

## 2023-10-01 MED ORDER — PHENYLEPHRINE 80 MCG/ML (10ML) SYRINGE FOR IV PUSH (FOR BLOOD PRESSURE SUPPORT)
PREFILLED_SYRINGE | INTRAVENOUS | Status: AC
  Filled 2023-10-01: qty 30

## 2023-10-01 MED ORDER — SUCCINYLCHOLINE CHLORIDE 200 MG/10ML IV SOSY
PREFILLED_SYRINGE | INTRAVENOUS | Status: AC
  Filled 2023-10-01: qty 10

## 2023-10-01 MED ORDER — IPRATROPIUM-ALBUTEROL 0.5-2.5 (3) MG/3ML IN SOLN: 3.0000 mL | RESPIRATORY_TRACT | Status: DC | PRN

## 2023-10-01 MED ORDER — MIDODRINE HCL 5 MG PO TABS
5.0000 mg | ORAL_TABLET | Freq: Three times a day (TID) | ORAL | Status: DC
  Administered 2023-10-01 – 2023-10-02 (×3): 5 mg via ORAL
  Filled 2023-10-01 (×3): qty 1

## 2023-10-01 MED ORDER — ACETAMINOPHEN 10 MG/ML IV SOLN
INTRAVENOUS | Status: DC | PRN
  Administered 2023-10-01: 1000 mg via INTRAVENOUS

## 2023-10-01 MED ORDER — DEXAMETHASONE SODIUM PHOSPHATE 4 MG/ML IJ SOLN
INTRAMUSCULAR | Status: DC | PRN
  Administered 2023-10-01: 5 mg via INTRAVENOUS

## 2023-10-01 MED ORDER — PHENYLEPHRINE 80 MCG/ML (10ML) SYRINGE FOR IV PUSH (FOR BLOOD PRESSURE SUPPORT)
PREFILLED_SYRINGE | INTRAVENOUS | Status: DC | PRN
  Administered 2023-10-01: 160 ug via INTRAVENOUS
  Administered 2023-10-01: 200 ug via INTRAVENOUS
  Administered 2023-10-01: 160 ug via INTRAVENOUS
  Administered 2023-10-01: 240 ug via INTRAVENOUS

## 2023-10-01 MED ORDER — IOHEXOL 300 MG/ML  SOLN
INTRAMUSCULAR | Status: DC | PRN
  Administered 2023-10-01: 10 mL

## 2023-10-01 MED ORDER — LACTATED RINGERS IV BOLUS
500.0000 mL | Freq: Once | INTRAVENOUS | Status: AC
  Administered 2023-10-01: 500 mL via INTRAVENOUS

## 2023-10-01 MED ORDER — ACETAMINOPHEN 10 MG/ML IV SOLN: 1000.0000 mg | Freq: Once | INTRAVENOUS | Status: DC | PRN

## 2023-10-01 MED ORDER — ACETAMINOPHEN 10 MG/ML IV SOLN
INTRAVENOUS | Status: AC
  Filled 2023-10-01: qty 100

## 2023-10-01 MED ORDER — LACTATED RINGERS IV BOLUS: 500.0000 mL | Freq: Once | INTRAVENOUS | Status: DC

## 2023-10-01 MED ORDER — ONDANSETRON HCL 4 MG/2ML IJ SOLN
INTRAMUSCULAR | Status: DC | PRN
  Administered 2023-10-01: 4 mg via INTRAVENOUS

## 2023-10-01 MED ORDER — HEPARIN SODIUM (PORCINE) 5000 UNIT/ML IJ SOLN
5000.0000 [IU] | Freq: Three times a day (TID) | INTRAMUSCULAR | Status: DC
  Administered 2023-10-01 – 2023-10-03 (×5): 5000 [IU] via SUBCUTANEOUS
  Filled 2023-10-01 (×5): qty 1

## 2023-10-01 MED ORDER — DEXAMETHASONE SODIUM PHOSPHATE 10 MG/ML IJ SOLN
INTRAMUSCULAR | Status: AC
  Filled 2023-10-01: qty 1

## 2023-10-01 MED ORDER — ACETAMINOPHEN 325 MG PO TABS
650.0000 mg | ORAL_TABLET | Freq: Four times a day (QID) | ORAL | Status: DC | PRN
  Administered 2023-10-01 – 2023-10-03 (×2): 650 mg via ORAL
  Filled 2023-10-01 (×2): qty 2

## 2023-10-01 MED ORDER — NICOTINE 21 MG/24HR TD PT24
21.0000 mg | MEDICATED_PATCH | Freq: Every day | TRANSDERMAL | Status: DC
  Administered 2023-10-02: 21 mg via TRANSDERMAL
  Filled 2023-10-01 (×2): qty 1

## 2023-10-01 MED ORDER — ORAL CARE MOUTH RINSE: 15.0000 mL | OROMUCOSAL | Status: DC | PRN

## 2023-10-01 MED ORDER — LIDOCAINE HCL (PF) 2 % IJ SOLN
INTRAMUSCULAR | Status: AC
  Filled 2023-10-01: qty 5

## 2023-10-01 MED ORDER — LIDOCAINE 2% (20 MG/ML) 5 ML SYRINGE
INTRAMUSCULAR | Status: DC | PRN
  Administered 2023-10-01: 60 mg via INTRAVENOUS

## 2023-10-01 SURGICAL SUPPLY — 11 items
BAG URO CATCHER STRL LF (MISCELLANEOUS) ×1 IMPLANT
CATH URETL OPEN 5X70 (CATHETERS) ×1 IMPLANT
CLOTH BEACON ORANGE TIMEOUT ST (SAFETY) ×1 IMPLANT
GLOVE BIO SURGEON STRL SZ7 (GLOVE) ×1 IMPLANT
GOWN STRL REUS W/ TWL XL LVL3 (GOWN DISPOSABLE) ×1 IMPLANT
GUIDEWIRE ZIPWRE .038 STRAIGHT (WIRE) ×1 IMPLANT
KIT TURNOVER KIT A (KITS) IMPLANT
MANIFOLD NEPTUNE II (INSTRUMENTS) ×1 IMPLANT
PACK CYSTO (CUSTOM PROCEDURE TRAY) ×1 IMPLANT
STENT URET 6FRX26 CONTOUR (STENTS) IMPLANT
TUBING CONNECTING 10 (TUBING) ×1 IMPLANT

## 2023-10-01 NOTE — ED Notes (Signed)
 Pt was using their cell phone to sing a song and show off his musical ability. Pt is in good spirits.  Provider updated to pt's ongoing soft BP

## 2023-10-01 NOTE — ED Notes (Signed)
 Pt states they are starting to hurt again and would like some pain medicine. Pt asked to use urinal as well. Pt given urinal

## 2023-10-01 NOTE — Op Note (Signed)
 Operative Note  Preoperative diagnosis:  1.  Left obstructing ureteral stone 2. sepsis  Postoperative diagnosis: 1.  same  Procedure(s): 1.  Cystoscopy with left ureteral stent placement 2. left retrograde pyelogram with interpretation 3. Fluoroscopy <1 hour with intraoperative interpretation  Surgeon: Julene Oaks, MD  Assistants:  None  Anesthesia:  General  Complications:  None  EBL:  minimal  Specimens: 1. None  Drains/Catheters: 1.  Left 6Fr x 26cm ureteral stent  Intraoperative findings:   Cystoscopy demonstrated no suspicious lesions, masses, stones or other pathology. Left Retrograde pyelogram demonstrated hydronephrosis beginning at in the proximal ureter Successful left ureteral stent placement with curl in the renal pelvis and bladder respectively.  Indication:  Vincent Griffin is a 63 y.o. male with above diagnoses. After reviewing the management options for treatment, he elected to proceed with the above surgical procedure(s). We have discussed the potential benefits and risks of the procedure, side effects of the proposed treatment, the likelihood of the patient achieving the goals of the procedure, and any potential problems that might occur during the procedure or recuperation. Informed consent has been obtained.  Description of procedure: The patient was taken to the operating room and general anesthesia was induced.  The patient was placed in the dorsal lithotomy position, prepped and draped in the usual sterile fashion, and preoperative antibiotics were administered. A preoperative time-out was performed.   Cystourethroscopy was performed.  The patient's urethra was examined and was normal. There was some bilobar prostatic hypertrophy. The bladder was then systematically examined in its entirety. There was no evidence for any bladder tumors, stones, or other mucosal pathology.    Attention then turned to the left ureteral orifice. A 0.038 zip wire was passed  through the left orifice and over the wire a 5 Fr open ended catheter was inserted and passed up to the level of the renal pelvis. Omnipaque  contrast was injected through the ureteral catheter and a retrograde pyelogram was performed with findings as dictated above. The wire was then replaced and the open ended catheter was removed.   A 6Fr x 26cm ureteral stent was advance over the wire. The stent was positioned appropriately under fluoroscopic and cystoscopic guidance.  The wire was then removed with an adequate stent curl noted in the renal pelvis as well as in the bladder.  The bladder was then emptied and the procedure ended.  The patient appeared to tolerate the procedure well and without complications.  The patient was able to be awakened and transferred to the recovery unit in satisfactory condition.   Plan:  return to floor, will arrange for left ureteroscopy as outpatient once over acute issues  Vincent All MD [facility]  Pager: [phone]

## 2023-10-01 NOTE — Consult Note (Signed)
 Urology Consult    Reason for consult: Left obstructing ureteral stone in the setting of infection with sepsis.  History of Present Illness: Vincent Griffin is a 63 y.o. with a left obstructing ureteral stone and sepsis from presumed GU source.  Patient initially presented to [facility] yesterday complaining of left flank pain.  He was febrile in the ED with an elevated lactic acid.  CT scan was obtained and showed a proximal left ureteral stone, ~ 8 mm, with mild left hydronephrosis.  There are 2 small nonobstructing stones in that kidney as well.  The patient was started on Rocephin ; blood culture preliminarily positive with enterococcus and proteus  Patient has been tachycardic with low blood pressures.  At the time my exam patient endorses some left sided discomfort but overall feels okay.  He is on nasal cannula   Past Medical History:  Diagnosis Date   CAD (coronary artery disease)    Cancer (HCC)    colon   History of kidney stones    Hypercholesterolemia    Hypertension    Iliac artery aneurysm, right (HCC)    Myocardial infarction (HCC) 2022   Peripheral vascular disease (HCC)     Past Surgical History:  Procedure Laterality Date   ABDOMINAL AORTIC ENDOVASCULAR STENT GRAFT N/A 09/08/2023   Procedure: INSERTION, ENDOVASCULAR STENT GRAFT, AORTA, ABDOMINAL;  Surgeon: [name], MD;  Location: [facility] OR;  Service: Vascular;  Laterality: N/A;   ANEURYSM COILING Left 09/08/2023   Procedure: EMBOLIZATION, USING COIL, OF RIGHT INTERNAL ILIAC;  Surgeon: [name], MD;  Location: [facility] OR;  Service: Vascular;  Laterality: Left;   APPENDECTOMY     BIOPSY  09/08/2022   Procedure: BIOPSY;  Surgeon: Vinetta Greening, DO;  Location: [facility] ENDO SUITE;  Service: Endoscopy;;   BIOPSY  04/10/2023   Procedure: BIOPSY;  Surgeon: Vinetta Greening, DO;  Location: [facility] END[facility];  Service: Endoscopy;;   CARDIAC CATHETERIZATION  07/2020   with stent.   CATARACT EXTRACTION      CATARACT EXTRACTION W/PHACO Left 01/07/2016   Procedure: CATARACT EXTRACTION PHACO AND INTRAOCULAR LENS PLACEMENT LEFT EYE CDE=13.32;  Surgeon: [name], MD;  Location: [facility] ORS;  Service: Ophthalmology;  Laterality: Left;   COLONOSCOPY WITH PROPOFOL  N/A 09/08/2022   Procedure: COLONOSCOPY WITH PROPOFOL ;  Surgeon: Vinetta Greening, DO;  Location: [facility] END[facility];  Service: Endoscopy;  Laterality: N/A;  11:30 am   COLONOSCOPY WITH PROPOFOL  N/A 04/10/2023   Procedure: COLONOSCOPY WITH PROPOFOL ;  Surgeon: Vinetta Greening, DO;  Location: [facility];  Service: Endoscopy;  Laterality: N/A;  100pm, asa 3   POLYPECTOMY  09/08/2022   Procedure: POLYPECTOMY;  Surgeon: Vinetta Greening, DO;  Location: [facility];  Service: Endoscopy;;   POLYPECTOMY  04/10/2023   Procedure: POLYPECTOMY;  Surgeon: Vinetta Greening, DO;  Location: [facility];  Service: Endoscopy;;   ROBOT ASSISTED LAPAROSCOPIC PARTIAL COLECTOMY  10/07/2022   SMALL INTESTINE SURGERY  1972   SUBMUCOSAL TATTOO INJECTION  09/08/2022   Procedure: SUBMUCOSAL TATTOO INJECTION;  Surgeon: Vinetta Greening, DO;  Location: [facility];  Service: Endoscopy;;    Current Hospital Medications:  Home Meds:  No current facility-administered medications on file prior to encounter.   Current Outpatient Medications on File Prior to Encounter  Medication Sig Dispense Refill   albuterol  (VENTOLIN  HFA) 108 (90 Base) MCG/ACT inhaler Inhale 2 puffs into the lungs every 6 (six) hours as needed for wheezing or shortness of  breath. 8 g 2   aspirin  81 MG chewable tablet Chew 81 mg by mouth daily.     atorvastatin  (LIPITOR) 80 MG tablet Take 80 mg by mouth at bedtime.     Budeson-Glycopyrrol-Formoterol  (BREZTRI  AEROSPHERE) 160-9-4.8 MCG/ACT AERO Inhale 2 puffs into the lungs 2 (two) times daily. 10.7 g 11   Ferrous Sulfate (IRON PO) Take by mouth. Takes  1 -2 iron tablets per week (Patient not taking: Reported on 09/13/2023)     hydrocortisone   (ANUSOL -HC) 2.5 % rectal cream Place 1 Application rectally 2 (two) times daily. 30 g 1   metoprolol  succinate (TOPROL -XL) 25 MG 24 hr tablet Take 25 mg by mouth daily.     nitroGLYCERIN  (NITROSTAT ) 0.4 MG SL tablet Place 0.4 mg under the tongue every 5 (five) minutes as needed for chest pain.     oxyCODONE -acetaminophen  (PERCOCET) 5-325 MG tablet Take 1 tablet by mouth every 6 (six) hours as needed for severe pain (pain score 7-10). (Patient not taking: Reported on 09/13/2023) 8 tablet 0     Scheduled Meds:  aspirin   81 mg Oral Daily   atorvastatin   80 mg Oral QHS   budesonide -glycopyrrolate -formoterol   2 puff Inhalation BID   Chlorhexidine  Gluconate Cloth  6 each Topical Daily   dextromethorphan-guaiFENesin   1 tablet Oral BID   heparin   5,000 Units Subcutaneous Q8H   ipratropium-albuterol   3 mL Nebulization TID   nicotine  21 mg Transdermal Daily   sodium chloride  flush  3 mL Intravenous Q12H   sodium chloride  flush  3 mL Intravenous Q12H   Continuous Infusions:  sodium chloride      cefTRIAXone  (ROCEPHIN )  IV     lactated ringers  Stopped (10/01/23 0227)   lactated ringers  Stopped (10/01/23 0120)   PRN Meds:.sodium chloride , acetaminophen  **OR** acetaminophen , albuterol , bisacodyl , fentaNYL  (SUBLIMAZE ) injection, ondansetron  **OR** ondansetron  (ZOFRAN ) IV, mouth rinse, polyethylene glycol, sodium chloride  flush  Allergies:  Allergies  Allergen Reactions   Zetia [Ezetimibe] Diarrhea and Nausea Only    Upset stomach    Family History  Problem Relation Age of Onset   Diabetes Mother    Stroke Mother    Colon polyps Brother    Colon cancer Neg Hx     Social History:  reports that he has been smoking cigarettes. He has never used smokeless tobacco. He reports that he does not drink alcohol and does not use drugs.  ROS: A complete review of systems was performed.  All systems are negative except for pertinent findings as noted.  Physical Exam:  Vital signs in last 24  hours: Temp:  [98.1 F (36.7 C)-102.2 F (39 C)] 99.3 F (37.4 C) (05/18 1400) Pulse Rate:  [78-114] 97 (05/18 1230) Resp:  [16-30] 16 (05/18 1230) BP: (69-153)/(45-118) 91/63 (05/18 1400) SpO2:  [83 %-100 %] 96 % (05/18 1400) Weight:  [140.6 kg] 140.6 kg (05/17 1727) Constitutional:  Alert and oriented, No acute distress Cardiovascular: Regular rate and rhythm Respiratory: Normal respiratory effort, Lungs clear bilaterally GI: Abdomen is soft, nontender, nondistended, no abdominal masses Neurologic: Grossly intact, no focal deficits Psychiatric: Normal mood and affect  Laboratory Data:  Recent Labs    09/30/23 1745 10/01/23 0559  WBC 11.3* 12.7*  HGB 14.8 13.3  HCT 46.3 41.9  PLT 271 158    Recent Labs    09/30/23 1745 09/30/23 1803 10/01/23 0559  NA 137 137 133*  K 4.2 4.0 4.2  CL 101 98 101  GLUCOSE 117* 123* 112*  BUN 14 14 19  CALCIUM  8.8* 8.6* 7.9*  CREATININE 1.41* 1.40* 2.08*     Results for orders placed or performed during the hospital encounter of 09/30/23 (from the past 24 hours)  Basic metabolic panel     Status: Abnormal   Collection Time: 09/30/23  5:45 PM  Result Value Ref Range   Sodium 137 135 - 145 mmol/L   Potassium 4.2 3.5 - 5.1 mmol/L   Chloride 101 98 - 111 mmol/L   CO2 27 22 - 32 mmol/L   Glucose, Bld 117 (H) 70 - 99 mg/dL   BUN 14 6 - 20 mg/dL   Creatinine, Ser 8.29 (H) 0.61 - 1.24 mg/dL   Calcium  8.8 (L) 8.9 - 10.3 mg/dL   GFR, Estimated 57 (L) >60 mL/min   Anion gap 9 5 - 15  CBC     Status: Abnormal   Collection Time: 09/30/23  5:45 PM  Result Value Ref Range   WBC 11.3 (H) 4.0 - 10.5 K/uL   RBC 4.46 4.22 - 5.81 MIL/uL   Hemoglobin 14.8 13.0 - 17.0 g/dL   HCT 56.2 13.0 - 86.5 %   MCV 103.8 (H) 80.0 - 100.0 fL   MCH 33.2 26.0 - 34.0 pg   MCHC 32.0 30.0 - 36.0 g/dL   RDW 78.4 69.6 - 29.5 %   Platelets 271 150 - 400 K/uL   nRBC 0.0 0.0 - 0.2 %  Lactic acid, plasma     Status: Abnormal   Collection Time: 09/30/23  6:03 PM   Result Value Ref Range   Lactic Acid, Venous 3.6 (HH) 0.5 - 1.9 mmol/L  Comprehensive metabolic panel     Status: Abnormal   Collection Time: 09/30/23  6:03 PM  Result Value Ref Range   Sodium 137 135 - 145 mmol/L   Potassium 4.0 3.5 - 5.1 mmol/L   Chloride 98 98 - 111 mmol/L   CO2 25 22 - 32 mmol/L   Glucose, Bld 123 (H) 70 - 99 mg/dL   BUN 14 6 - 20 mg/dL   Creatinine, Ser 2.84 (H) 0.61 - 1.24 mg/dL   Calcium  8.6 (L) 8.9 - 10.3 mg/dL   Total Protein 7.8 6.5 - 8.1 g/dL   Albumin  3.4 (L) 3.5 - 5.0 g/dL   AST 39 15 - 41 U/L   ALT 38 0 - 44 U/L   Alkaline Phosphatase 114 38 - 126 U/L   Total Bilirubin 0.8 0.0 - 1.2 mg/dL   GFR, Estimated 58 (L) >60 mL/min   Anion gap 14 5 - 15  Protime-INR     Status: None   Collection Time: 09/30/23  6:03 PM  Result Value Ref Range   Prothrombin Time 13.3 11.4 - 15.2 seconds   INR 1.0 0.8 - 1.2  Blood Culture (routine x 2)     Status: None (Preliminary result)   Collection Time: 09/30/23  6:03 PM   Specimen: Right Antecubital; Blood  Result Value Ref Range   Specimen Description      RIGHT ANTECUBITAL Performed at [facility], [address], [place], [place] [address]    Special Requests      BOTTLES DRAWN AEROBIC AND ANAEROBIC Blood Culture adequate volume Performed at [facility], [address], [place], [place] [address]    Culture  Setup Time      GRAM NEGATIVE RODS ANAEROBIC BOTTLE ONLY Gram Stain Report Called to,Read Back By and Verified With: Burrell Casa AT 0808 ON [id number] BY S DALTON CRITICAL RESULT CALLED TO, READ  BACK BY AND VERIFIED WITH: PHARMD MICHELLE BELL [id number] AT 1356 BY EC Performed at [facility], [address], [place], [place] [address]    Culture GRAM NEGATIVE RODS    Report Status PENDING   Blood Culture ID Panel (Reflexed)     Status: Abnormal   Collection Time: 09/30/23  6:03 PM  Result Value Ref Range   Enterococcus faecalis NOT DETECTED NOT DETECTED   Enterococcus Faecium NOT DETECTED NOT  DETECTED   Listeria monocytogenes NOT DETECTED NOT DETECTED   Staphylococcus species NOT DETECTED NOT DETECTED   Staphylococcus aureus (BCID) NOT DETECTED NOT DETECTED   Staphylococcus epidermidis NOT DETECTED NOT DETECTED   Staphylococcus lugdunensis NOT DETECTED NOT DETECTED   Streptococcus species NOT DETECTED NOT DETECTED   Streptococcus agalactiae NOT DETECTED NOT DETECTED   Streptococcus pneumoniae NOT DETECTED NOT DETECTED   Streptococcus pyogenes NOT DETECTED NOT DETECTED   A.calcoaceticus-baumannii NOT DETECTED NOT DETECTED   Bacteroides fragilis NOT DETECTED NOT DETECTED   Enterobacterales DETECTED (A) NOT DETECTED   Enterobacter cloacae complex NOT DETECTED NOT DETECTED   Escherichia coli NOT DETECTED NOT DETECTED   Klebsiella aerogenes NOT DETECTED NOT DETECTED   Klebsiella oxytoca NOT DETECTED NOT DETECTED   Klebsiella pneumoniae NOT DETECTED NOT DETECTED   Proteus species DETECTED (A) NOT DETECTED   Salmonella species NOT DETECTED NOT DETECTED   Serratia marcescens NOT DETECTED NOT DETECTED   Haemophilus influenzae NOT DETECTED NOT DETECTED   Neisseria meningitidis NOT DETECTED NOT DETECTED   Pseudomonas aeruginosa NOT DETECTED NOT DETECTED   Stenotrophomonas maltophilia NOT DETECTED NOT DETECTED   Candida albicans NOT DETECTED NOT DETECTED   Candida auris NOT DETECTED NOT DETECTED   Candida glabrata NOT DETECTED NOT DETECTED   Candida krusei NOT DETECTED NOT DETECTED   Candida parapsilosis NOT DETECTED NOT DETECTED   Candida tropicalis NOT DETECTED NOT DETECTED   Cryptococcus neoformans/gattii NOT DETECTED NOT DETECTED   CTX-M ESBL NOT DETECTED NOT DETECTED   Carbapenem resistance IMP NOT DETECTED NOT DETECTED   Carbapenem resistance KPC NOT DETECTED NOT DETECTED   Carbapenem resistance NDM NOT DETECTED NOT DETECTED   Carbapenem resist OXA 48 LIKE NOT DETECTED NOT DETECTED   Carbapenem resistance VIM NOT DETECTED NOT DETECTED  Blood Culture (routine x 2)      Status: None (Preliminary result)   Collection Time: 09/30/23  6:05 PM   Specimen: Right Antecubital; Blood  Result Value Ref Range   Specimen Description      RIGHT ANTECUBITAL Performed at [facility], [address], [place], [place] [address]    Special Requests      BOTTLES DRAWN AEROBIC AND ANAEROBIC Blood Culture adequate volume Performed at [facility], [address], [place], [place] [address]    Culture  Setup Time      GRAM NEGATIVE RODS ANAEROBIC BOTTLE ONLY Gram Stain Report Called to,Read Back By and Verified With: Burrell Casa AT 0808 ON [id number] BY S DALTON CRITICAL VALUE NOTED.  VALUE IS CONSISTENT WITH PREVIOUSLY REPORTED AND CALLED VALUE. Performed at [facility], [address] [place], [place] [address]    Culture GRAM NEGATIVE RODS    Report Status PENDING   Urinalysis, w/ Reflex to Culture (Infection Suspected) -Urine, Clean Catch     Status: Abnormal   Collection Time: 09/30/23  7:07 PM  Result Value Ref Range   Specimen Source URINE, CLEAN CATCH    Color, Urine YELLOW YELLOW   APPearance HAZY (A) CLEAR  Specific Gravity, Urine 1.016 1.005 - 1.030   pH 6.0 5.0 - 8.0   Glucose, UA NEGATIVE NEGATIVE mg/dL   Hgb urine dipstick MODERATE (A) NEGATIVE   Bilirubin Urine NEGATIVE NEGATIVE   Ketones, ur NEGATIVE NEGATIVE mg/dL   Protein, ur 30 (A) NEGATIVE mg/dL   Nitrite POSITIVE (A) NEGATIVE   Leukocytes,Ua MODERATE (A) NEGATIVE   RBC / HPF 6-10 0 - 5 RBC/hpf   WBC, UA >50 0 - 5 WBC/hpf   Bacteria, UA RARE (A) NONE SEEN   Squamous Epithelial / HPF 0-5 0 - 5 /HPF   Mucus PRESENT    Crystals PRESENT (A) NEGATIVE  Lactic acid, plasma     Status: Abnormal   Collection Time: 09/30/23  8:53 PM  Result Value Ref Range   Lactic Acid, Venous 2.4 (HH) 0.5 - 1.9 mmol/L  Lactic acid, plasma     Status: Abnormal   Collection Time: 10/01/23  1:36 AM  Result Value Ref Range   Lactic Acid, Venous 3.6 (HH) 0.5 - 1.9 mmol/L  HIV Antibody (routine testing w  rflx)     Status: None   Collection Time: 10/01/23  1:36 AM  Result Value Ref Range   HIV Screen 4th Generation wRfx Non Reactive Non Reactive  Lactic acid, plasma     Status: Abnormal   Collection Time: 10/01/23  5:59 AM  Result Value Ref Range   Lactic Acid, Venous 4.4 (HH) 0.5 - 1.9 mmol/L  Vitamin B12     Status: None   Collection Time: 10/01/23  5:59 AM  Result Value Ref Range   Vitamin B-12 326 180 - 914 pg/mL  Folate     Status: None   Collection Time: 10/01/23  5:59 AM  Result Value Ref Range   Folate 7.0 >5.9 ng/mL  Comprehensive metabolic panel     Status: Abnormal   Collection Time: 10/01/23  5:59 AM  Result Value Ref Range   Sodium 133 (L) 135 - 145 mmol/L   Potassium 4.2 3.5 - 5.1 mmol/L   Chloride 101 98 - 111 mmol/L   CO2 22 22 - 32 mmol/L   Glucose, Bld 112 (H) 70 - 99 mg/dL   BUN 19 6 - 20 mg/dL   Creatinine, Ser 1.61 (H) 0.61 - 1.24 mg/dL   Calcium  7.9 (L) 8.9 - 10.3 mg/dL   Total Protein 6.6 6.5 - 8.1 g/dL   Albumin  2.8 (L) 3.5 - 5.0 g/dL   AST 46 (H) 15 - 41 U/L   ALT 33 0 - 44 U/L   Alkaline Phosphatase 77 38 - 126 U/L   Total Bilirubin 0.8 0.0 - 1.2 mg/dL   GFR, Estimated 36 (L) >60 mL/min   Anion gap 10 5 - 15  CBC     Status: Abnormal   Collection Time: 10/01/23  5:59 AM  Result Value Ref Range   WBC 12.7 (H) 4.0 - 10.5 K/uL   RBC 3.99 (L) 4.22 - 5.81 MIL/uL   Hemoglobin 13.3 13.0 - 17.0 g/dL   HCT 09.6 04.5 - 40.9 %   MCV 105.0 (H) 80.0 - 100.0 fL   MCH 33.3 26.0 - 34.0 pg   MCHC 31.7 30.0 - 36.0 g/dL   RDW 81.1 91.4 - 78.2 %   Platelets 158 150 - 400 K/uL   nRBC 0.0 0.0 - 0.2 %  Magnesium      Status: Abnormal   Collection Time: 10/01/23  5:59 AM  Result Value Ref Range   Magnesium  1.5 (  L) 1.7 - 2.4 mg/dL  Phosphorus     Status: None   Collection Time: 10/01/23  5:59 AM  Result Value Ref Range   Phosphorus 3.1 2.5 - 4.6 mg/dL  Brain natriuretic peptide     Status: Abnormal   Collection Time: 10/01/23  5:59 AM  Result Value Ref Range    B Natriuretic Peptide 258.0 (H) 0.0 - 100.0 pg/mL  Lactic acid, plasma     Status: Abnormal   Collection Time: 10/01/23  9:25 AM  Result Value Ref Range   Lactic Acid, Venous 3.0 (HH) 0.5 - 1.9 mmol/L  Lactic acid, plasma     Status: Abnormal   Collection Time: 10/01/23 12:33 PM  Result Value Ref Range   Lactic Acid, Venous 2.5 (HH) 0.5 - 1.9 mmol/L   Recent Results (from the past 240 hours)  Blood Culture (routine x 2)     Status: None (Preliminary result)   Collection Time: 09/30/23  6:03 PM   Specimen: Right Antecubital; Blood  Result Value Ref Range Status   Specimen Description   Final    RIGHT ANTECUBITAL Performed at [facility], [address], [place], [place] [address]    Special Requests   Final    BOTTLES DRAWN AEROBIC AND ANAEROBIC Blood Culture adequate volume Performed at [facility], [address], [place], [place] [address]    Culture  Setup Time   Final    GRAM NEGATIVE RODS ANAEROBIC BOTTLE ONLY Gram Stain Report Called to,Read Back By and Verified With: Burrell Casa AT 0808 ON [id number] BY S DALTON CRITICAL RESULT CALLED TO, READ BACK BY AND VERIFIED WITH: PHARMD MICHELLE BELL [id number] AT 1356 BY EC Performed at [facility], [address], [place], [place] [address]    Culture GRAM NEGATIVE RODS  Final   Report Status PENDING  Incomplete  Blood Culture ID Panel (Reflexed)     Status: Abnormal   Collection Time: 09/30/23  6:03 PM  Result Value Ref Range Status   Enterococcus faecalis NOT DETECTED NOT DETECTED Final   Enterococcus Faecium NOT DETECTED NOT DETECTED Final   Listeria monocytogenes NOT DETECTED NOT DETECTED Final   Staphylococcus species NOT DETECTED NOT DETECTED Final   Staphylococcus aureus (BCID) NOT DETECTED NOT DETECTED Final   Staphylococcus epidermidis NOT DETECTED NOT DETECTED Final   Staphylococcus lugdunensis NOT DETECTED NOT DETECTED Final   Streptococcus species NOT DETECTED NOT DETECTED Final   Streptococcus agalactiae  NOT DETECTED NOT DETECTED Final   Streptococcus pneumoniae NOT DETECTED NOT DETECTED Final   Streptococcus pyogenes NOT DETECTED NOT DETECTED Final   A.calcoaceticus-baumannii NOT DETECTED NOT DETECTED Final   Bacteroides fragilis NOT DETECTED NOT DETECTED Final   Enterobacterales DETECTED (A) NOT DETECTED Final    Comment: Enterobacterales represent a large order of gram negative bacteria, not a single organism. CRITICAL RESULT CALLED TO, READ BACK BY AND VERIFIED WITH: PHARMD MICHELLE BELL [id number] AT 1356 BY EC    Enterobacter cloacae complex NOT DETECTED NOT DETECTED Final   Escherichia coli NOT DETECTED NOT DETECTED Final   Klebsiella aerogenes NOT DETECTED NOT DETECTED Final   Klebsiella oxytoca NOT DETECTED NOT DETECTED Final   Klebsiella pneumoniae NOT DETECTED NOT DETECTED Final   Proteus species DETECTED (A) NOT DETECTED Final    Comment: CRITICAL RESULT CALLED TO, READ BACK BY AND VERIFIED WITH: PHARMD MICHELLE BELL [id number] AY 1356 BY EC    Salmonella species NOT DETECTED NOT DETECTED Final   Serratia marcescens NOT DETECTED NOT  DETECTED Final   Haemophilus influenzae NOT DETECTED NOT DETECTED Final   Neisseria meningitidis NOT DETECTED NOT DETECTED Final   Pseudomonas aeruginosa NOT DETECTED NOT DETECTED Final   Stenotrophomonas maltophilia NOT DETECTED NOT DETECTED Final   Candida albicans NOT DETECTED NOT DETECTED Final   Candida auris NOT DETECTED NOT DETECTED Final   Candida glabrata NOT DETECTED NOT DETECTED Final   Candida krusei NOT DETECTED NOT DETECTED Final   Candida parapsilosis NOT DETECTED NOT DETECTED Final   Candida tropicalis NOT DETECTED NOT DETECTED Final   Cryptococcus neoformans/gattii NOT DETECTED NOT DETECTED Final   CTX-M ESBL NOT DETECTED NOT DETECTED Final   Carbapenem resistance IMP NOT DETECTED NOT DETECTED Final   Carbapenem resistance KPC NOT DETECTED NOT DETECTED Final   Carbapenem resistance NDM NOT DETECTED NOT DETECTED Final    Carbapenem resist OXA 48 LIKE NOT DETECTED NOT DETECTED Final   Carbapenem resistance VIM NOT DETECTED NOT DETECTED Final    Comment: Performed at [facility], [address] [place], [place] [address]  Blood Culture (routine x 2)     Status: None (Preliminary result)   Collection Time: 09/30/23  6:05 PM   Specimen: Right Antecubital; Blood  Result Value Ref Range Status   Specimen Description   Final    RIGHT ANTECUBITAL Performed at [facility], [address], [place], [place] [address]    Special Requests   Final    BOTTLES DRAWN AEROBIC AND ANAEROBIC Blood Culture adequate volume Performed at [facility], [address], [place], [place] [address]    Culture  Setup Time   Final    GRAM NEGATIVE RODS ANAEROBIC BOTTLE ONLY Gram Stain Report Called to,Read Back By and Verified With: Burrell Casa AT 0808 ON [id number] BY S DALTON CRITICAL VALUE NOTED.  VALUE IS CONSISTENT WITH PREVIOUSLY REPORTED AND CALLED VALUE. Performed at [facility], [address] [place], [place] [address]    Culture GRAM NEGATIVE RODS  Final   Report Status PENDING  Incomplete    Renal Function: Recent Labs    09/30/23 1745 09/30/23 1803 10/01/23 0559  CREATININE 1.41* 1.40* 2.08*   Estimated Creatinine Clearance: 53.4 mL/min (A) (by C-G formula based on SCr of 2.08 mg/dL (H)).  Radiologic Imaging: DG Chest Port 1 View Result Date: 09/30/2023 CLINICAL DATA:  Possible sepsis EXAM: PORTABLE CHEST 1 VIEW COMPARISON:  09/08/2023 FINDINGS: Cardiac shadow is enlarged. Mild aortic calcifications are seen. Persistent interstitial opacities are noted stable from the prior exam. No focal confluent infiltrate is seen. No bony abnormality is noted. IMPRESSION: Stable appearance when compared with the prior exam. Electronically Signed   By: Violeta Grey M.D.   On: 09/30/2023 19:46   CT Renal Stone Study Result Date: 09/30/2023 CLINICAL DATA:  Left flank pain EXAM: CT ABDOMEN AND PELVIS WITHOUT  CONTRAST TECHNIQUE: Multidetector CT imaging of the abdomen and pelvis was performed following the standard protocol without IV contrast. RADIATION DOSE REDUCTION: This exam was performed according to the departmental dose-optimization program which includes automated exposure control, adjustment of the mA and/or kV according to patient size and/or use of iterative reconstruction technique. COMPARISON:  09/27/2023 FINDINGS: Lower chest: Lung bases are free of acute infiltrate or sizable effusion. Hepatobiliary: No focal liver abnormality is seen. No gallstones, gallbladder wall thickening, or biliary dilatation. Pancreas: Unremarkable. No pancreatic ductal dilatation or surrounding inflammatory changes. Spleen: Normal in size without focal abnormality. Adrenals/Urinary Tract: Adrenal glands are within normal limits. Nonobstructing right renal calculi  noted and stable. The right ureter is within normal limits. Left kidney demonstrates significant perinephric stranding with migration of 1 of the upper pole stones into the proximal left ureter. This calculus measures up to 8 mm. Small punctate nonobstructing stones remain in the left collecting system. The distal left ureter is within normal limits. Bladder is decompressed. Stomach/Bowel: Diverticulosis without diverticulitis is noted. Postsurgical changes in the right colon are seen. No obstructive changes are noted. The appendix has been surgically. Small bowel and stomach are unremarkable. Vascular/Lymphatic: Atherosclerotic calcifications of the aorta are noted. Aortic stent graft is seen and stable from the prior exam. No lymphadenopathy is noted. Reproductive: Prostate is unremarkable. Other: No abdominal wall hernia or abnormality. No abdominopelvic ascites. Musculoskeletal: No acute or significant osseous findings. IMPRESSION: Interval migration of an 8 mm stone from the left kidney into the proximal left ureter with obstructive change. Bilateral nonobstructing  renal calculi stable from the prior exam. Diverticulosis without diverticulitis. Prior endovascular stent graft therapy of the abdominal aorta. No aneurysmal dilatation is seen. 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 Electronically Signed   By: Violeta Grey M.D.   On: 09/30/2023 19:45    I independently reviewed the above imaging studies.  Impression/Recommendation 63 year old gentleman with an obstructing left ureteral stone in the setting of sepsis.  I recommended a ureteral stent emergently.  Procedure and risks were reviewed in detail with patient.  We discussed the possibility of remaining intubated after the procedure.  Julene Oaks MD 10/01/2023, 2:15 PM  [facility]  Pager: [phone]

## 2023-10-01 NOTE — ED Notes (Signed)
 Pt's BP taken manually by another RN for accuracy of pt's hypotension. Automatic and manual BP the same Provider made aware. Pt has been a&o and talkative

## 2023-10-01 NOTE — Discharge Instructions (Signed)

## 2023-10-01 NOTE — ED Notes (Signed)
 While pt using urinal and some urine went into the floor in addition to the urinal. Pt cleaned the floor with clorox wipes. Pt advised not to use clorox wipes with bare hands.

## 2023-10-01 NOTE — Progress Notes (Signed)
 PHARMACY - PHYSICIAN COMMUNICATION CRITICAL VALUE ALERT - BLOOD CULTURE IDENTIFICATION (BCID)  Vincent Griffin is an 63 y.o. male who presented to [facility] on 09/30/2023 with a chief complaint of flank pain.  Assessment:  BCID 2/4 (anaerobic bottle each set) GNR = Proteus species - source - urinary/kidney stone.  Name of physician (or Provider) Contacted: Alycia Babcock via secure chat  Current antibiotics: cefepime 2 gm q12h  Changes to prescribed antibiotics recommended:  Change to ceftriaxone  2 gm IV q24 hrs  Results for orders placed or performed during the hospital encounter of 09/30/23  Blood Culture ID Panel (Reflexed) (Collected: 09/30/2023  6:03 PM)  Result Value Ref Range   Enterococcus faecalis NOT DETECTED NOT DETECTED   Enterococcus Faecium NOT DETECTED NOT DETECTED   Listeria monocytogenes NOT DETECTED NOT DETECTED   Staphylococcus species NOT DETECTED NOT DETECTED   Staphylococcus aureus (BCID) NOT DETECTED NOT DETECTED   Staphylococcus epidermidis NOT DETECTED NOT DETECTED   Staphylococcus lugdunensis NOT DETECTED NOT DETECTED   Streptococcus species NOT DETECTED NOT DETECTED   Streptococcus agalactiae NOT DETECTED NOT DETECTED   Streptococcus pneumoniae NOT DETECTED NOT DETECTED   Streptococcus pyogenes NOT DETECTED NOT DETECTED   A.calcoaceticus-baumannii NOT DETECTED NOT DETECTED   Bacteroides fragilis NOT DETECTED NOT DETECTED   Enterobacterales DETECTED (A) NOT DETECTED   Enterobacter cloacae complex NOT DETECTED NOT DETECTED   Escherichia coli NOT DETECTED NOT DETECTED   Klebsiella aerogenes NOT DETECTED NOT DETECTED   Klebsiella oxytoca NOT DETECTED NOT DETECTED   Klebsiella pneumoniae NOT DETECTED NOT DETECTED   Proteus species DETECTED (A) NOT DETECTED   Salmonella species NOT DETECTED NOT DETECTED   Serratia marcescens NOT DETECTED NOT DETECTED   Haemophilus influenzae NOT DETECTED NOT DETECTED   Neisseria meningitidis NOT DETECTED NOT DETECTED    Pseudomonas aeruginosa NOT DETECTED NOT DETECTED   Stenotrophomonas maltophilia NOT DETECTED NOT DETECTED   Candida albicans NOT DETECTED NOT DETECTED   Candida auris NOT DETECTED NOT DETECTED   Candida glabrata NOT DETECTED NOT DETECTED   Candida krusei NOT DETECTED NOT DETECTED   Candida parapsilosis NOT DETECTED NOT DETECTED   Candida tropicalis NOT DETECTED NOT DETECTED   Cryptococcus neoformans/gattii NOT DETECTED NOT DETECTED   CTX-M ESBL NOT DETECTED NOT DETECTED   Carbapenem resistance IMP NOT DETECTED NOT DETECTED   Carbapenem resistance KPC NOT DETECTED NOT DETECTED   Carbapenem resistance NDM NOT DETECTED NOT DETECTED   Carbapenem resist OXA 48 LIKE NOT DETECTED NOT DETECTED   Carbapenem resistance VIM NOT DETECTED NOT DETECTED    Rubie Corona, Pharm.D Use secure chat for questions 10/01/2023 2:10 PM

## 2023-10-01 NOTE — Progress Notes (Addendum)
 PROGRESS NOTE  Vincent Griffin, is a 63 y.o. male, DOB - January 20, 1961, ZOX:[id number]  Admit date - 09/30/2023   Admitting Physician Twilla Galea, DO  Outpatient Primary MD for the patient is Yevette Hem, FNP  LOS - 1  Chief Complaint  Patient presents with   Flank Pain      Brief Narrative:  63 y.o. 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 (status post Coil embolization right internal iliac artery (10 mm x 20 cm interlock and 8 mm x 20 cm interlock coils, Repair of right common iliac aneurysm with aortobiiliac stent graft, Placement of extension endovascular limb on the right on 09/08/2023)  , history of recurrent nephrolithiasis, history of cecal (colon) cancer s/p robotic assisted laparoscopic right hemicolectomy on 10/07/2022 , morbid obesity admitted on 09/30/2023 after presenting with left flank pain, dysuria, fever and leukocytosis and found to have severe gram-negative rod bacteremia and sepsis from presumed urinary source. -Transferring to [facility] for higher level of care and possible urological intervention   -Assessment and Plan: 1)Severe GNR bacteremia and sepsis from presumed urinary source--POA -CT renal stone study shows---Interval migration of an 8 mm stone from the left kidney into the proximal left ureter with obstructive change--transferred to [facility] long as patient will need urological intervention -blood cultures from 09/30/2023 already growing gram-negative rods -Empirically treat with IV cefepime pending further culture data -Blood pressures were 70s over 40s, after IV fluid boluses blood pressure up to 80s over 60s--BP continues to improve with IV fluids at this time -- Continue aggressive IV fluids -- May need pressors WBC 11.3 >>12.7 Lactic Acid-- 3.6 >>2.4 >>3.6 >>4.4>>3.0 - Tmax 102.2, T-current 99.1 Discussed with PCCM Attending Dr Casper Clement and Urology attending Dr. Tyrone Gallop Machen--Dr. Hunsucker recommends keeping the patient on [facility] hospitalist service for now in stepdown unit at [facility], official PCCM consult can be requested if patient fails to improve as anticipated with antibiotics and IV fluids  2)Presumed gram-negative rod UTI--- complicated UTI in the setting of nephrolithiasis -- Management as above #1 -  3)Recurrent nephrolithiasis----CT renal stone study shows---Interval migration of an 8 mm stone from the left kidney into the proximal left ureter with obstructive change--transferred to [facility] long as patient will need urological intervention -- Hold off on Flomax due to hemodynamic instability at this time - Patient with left flank pain  4)COPD/tobacco abuse--now with acute hypoxic respiratory failure - Admission chest x-ray without acute findings - CT renal stone protocol without significant abnormalities in the visualized lower chest/lung bases - Bronchodilators and supplemental oxygen as ordered - Currently requiring up to 4 L of oxygen - Give nicotine patch  5)AKI----acute kidney injury--due to persistent hypotension in the setting of severe sepsis compounded by nephrolithiasis and obstructive uropathy - creatinine on admission= 1.41,  -baseline creatinine = 1.0 on 09/09/2023   ,  creatinine is now= up to 2.08 which is a new peak,  - Continue IV fluids  -Avoid hypotension -renally adjust medications, avoid nephrotoxic agents / dehydration  / hypotension  6)PAD----Prior endovascular stent graft therapy of the abdominal aorta. 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 Stable aneurysmal dilatation of the right common iliac artery is noted -- Continue aspirin  and atorvastatin   7) hypomagnesemia/hyponatremia--replace magnesium , hydrate  8)CAD--history of prior MI, -- Chest pain-free, EKG sinus rhythm with interventricular conduction delay, nonspecific T wave and ST changes - continue aspirin  and atorvastatin  - Hold metoprolol  due to hypotension - Echo requested on 10/01/2023--report pending  9)Morbid Obesity- -after resolution of acute illness patient will need to adhere to low calorie diet, portion control and increase physical activity discussed with patient -Body mass index is 44.48 kg/m.  Status is: Inpatient   Disposition: The patient is from: Home              Anticipated d/c is to: Home              Anticipated d/c date is: > 3 days              Patient currently is not medically stable to d/c. Barriers: Not Clinically Stable-   Code Status :  -  Code Status: Full Code   Family Communication:   (patient is alert, awake and coherent)  I called and updated pt's wife [name] at [phone]  DVT Prophylaxis  :   - SCDs *  heparin  injection 5,000 Units Start: 10/01/23 1400 SCDs Start: 10/01/23 1014 Place TED hose Start: 10/01/23 1014 SCDs Start: 10/01/23 0116   Lab Results  Component Value Date   PLT 158 10/01/2023   Inpatient Medications  Scheduled Meds:  cyanocobalamin   1,000 mcg Intramuscular Q30 days   dextromethorphan-guaiFENesin   1 tablet Oral BID   Continuous Infusions:  cefTRIAXone  (ROCEPHIN )  IV     lactated ringers  Stopped (10/01/23 0227)   lactated ringers  Stopped (10/01/23 0120)   lactated ringers  100 mL/hr at 10/01/23 0617   PRN Meds:.acetaminophen  **OR** acetaminophen , fentaNYL  (SUBLIMAZE ) injection, ipratropium-albuterol , ondansetron  **OR** ondansetron  (ZOFRAN ) IV, perflutren lipid microspheres (DEFINITY) IV suspension   Anti-infectives (From admission, onward)    Start     Dose/Rate Route  Frequency Ordered Stop   10/01/23 1000  cefTRIAXone  (ROCEPHIN ) 1 g in sodium chloride  0.9 % 100 mL IVPB        1 g 200 mL/hr over 30 Minutes Intravenous Every 24 hours 10/01/23 0100     09/30/23 2000  cefTRIAXone  (ROCEPHIN ) 2 g in sodium chloride  0.9 % 100 mL IVPB        2 g 200 mL/hr over 30 Minutes Intravenous Once 09/30/23 1952 09/30/23 2104         Subjective: Satira Curet today has  no emesis,  No chest pain,    -Patient's fever was initially high at 102.2, but then began to gradually defervescence -- Overall fever curve improving - Oxygen requirement up to 4 L I called and updated pt's wife [name] at [phone],  questions answered  Discussed with PCCM Attending Dr Casper Clement and Urology attending Dr. Tyrone Gallop Machen--Dr. Hunsucker recommends keeping the patient on [facility] hospitalist service for now in stepdown unit at [facility], official PCCM consult can be requested if patient fails to improve as anticipated with antibiotics and IV fluids  Objective: Vitals:   10/01/23 0900 10/01/23 0915 10/01/23 0920 10/01/23 0952  BP: (!) 85/54  (!) 80/50 (!) 88/49  Pulse: 93 98 98 94  Resp: 20 19 (!) 22 18  Temp:      TempSrc:      SpO2: 94% 91% 92% 96%  Weight:      Height:  Intake/Output Summary (Last 24 hours) at 10/01/2023 1003 Last data filed at 10/01/2023 0848 Gross per 24 hour  Intake 2552.4 ml  Output --  Net 2552.4 ml   Filed Weights   09/30/23 1727  Weight: (!) 140.6 kg   Temp:  [98.3 F (36.8 C)-102.2 F (39 C)] 99.1 F (37.3 C) (05/18 0715) Pulse Rate:  [78-114] 90 (05/18 1022) Resp:  [16-30] 16 (05/18 1022) BP: (69-153)/(45-118) 97/63 (05/18 1022) SpO2:  [83 %-98 %] 94 % (05/18 1022) Weight:  [140.6 kg] 140.6 kg (05/17 1727)  Physical Exam  Gen:- Awake Alert, morbidly obese, no acute distress HEENT:- [place].AT, No sclera icterus Nose- [place] 4L/min Neck-Supple Neck,No JVD,.  Lungs-diminished breath sounds in bases, no wheezing  CV- S1, S2 normal,  regular  Abd-  +ve B.Sounds, Abd Soft, increased truncal adiposity noted, left CVA tenderness noted Extremity/Skin:- No  edema, pedal pulses present  Psych-affect is appropriate, oriented x3 Neuro-no new focal deficits, no tremors  Data Reviewed: I have personally reviewed following labs and imaging studies  CBC: Recent Labs  Lab 09/30/23 1745 10/01/23 0559  WBC 11.3* 12.7*  HGB 14.8 13.3  HCT 46.3 41.9  MCV 103.8* 105.0*  PLT 271 158   Basic Metabolic Panel: Recent Labs  Lab 09/30/23 1745 09/30/23 1803 10/01/23 0559  NA 137 137 133*  K 4.2 4.0 4.2  CL 101 98 101  CO2 27 25 22   GLUCOSE 117* 123* 112*  BUN 14 14 19   CREATININE 1.41* 1.40* 2.08*  CALCIUM  8.8* 8.6* 7.9*  MG  --   --  1.5*  PHOS  --   --  3.1   GFR: Estimated Creatinine Clearance: 53.4 mL/min (A) (by C-G formula based on SCr of 2.08 mg/dL (H)). Liver Function Tests: Recent Labs  Lab 09/30/23 1803 10/01/23 0559  AST 39 46*  ALT 38 33  ALKPHOS 114 77  BILITOT 0.8 0.8  PROT 7.8 6.6  ALBUMIN  3.4* 2.8*   Recent Results (from the past 240 hours)  Blood Culture (routine x 2)     Status: None (Preliminary result)   Collection Time: 09/30/23  6:03 PM   Specimen: Right Antecubital; Blood  Result Value Ref Range Status   Specimen Description RIGHT ANTECUBITAL  Final   Special Requests   Final    BOTTLES DRAWN AEROBIC AND ANAEROBIC Blood Culture adequate volume   Culture  Setup Time   Final    GRAM NEGATIVE RODS ANAEROBIC BOTTLE ONLY Gram Stain Report Called to,Read Back By and Verified With: Burrell Casa AT 0808 ON [id number] BY S DALTON Performed at [facility], [address], [place], [place] [address]    Culture GRAM NEGATIVE RODS  Final   Report Status PENDING  Incomplete  Blood Culture (routine x 2)     Status: None (Preliminary result)   Collection Time: 09/30/23  6:05 PM   Specimen: Right Antecubital; Blood  Result Value Ref Range Status   Specimen Description RIGHT ANTECUBITAL  Final    Special Requests   Final    BOTTLES DRAWN AEROBIC AND ANAEROBIC Blood Culture adequate volume   Culture  Setup Time   Final    GRAM NEGATIVE RODS ANAEROBIC BOTTLE ONLY Gram Stain Report Called to,Read Back By and Verified With: Burrell Casa AT 0808 ON [id number] BY S DALTON Performed at [facility], [address], [place], [place] [address]    Culture GRAM NEGATIVE RODS  Final   Report Status PENDING  Incomplete    Radiology Studies:  DG Chest Port 1 View Result Date: 09/30/2023 CLINICAL DATA:  Possible sepsis EXAM: PORTABLE CHEST 1 VIEW COMPARISON:  09/08/2023 FINDINGS: Cardiac shadow is enlarged. Mild aortic calcifications are seen. Persistent interstitial opacities are noted stable from the prior exam. No focal confluent infiltrate is seen. No bony abnormality is noted. IMPRESSION: Stable appearance when compared with the prior exam. Electronically Signed   By: Violeta Grey M.D.   On: 09/30/2023 19:46   CT Renal Stone Study Result Date: 09/30/2023 CLINICAL DATA:  Left flank pain EXAM: CT ABDOMEN AND PELVIS WITHOUT CONTRAST TECHNIQUE: Multidetector CT imaging of the abdomen and pelvis was performed following the standard protocol without IV contrast. RADIATION DOSE REDUCTION: This exam was performed according to the departmental dose-optimization program which includes automated exposure control, adjustment of the mA and/or kV according to patient size and/or use of iterative reconstruction technique. COMPARISON:  09/27/2023 FINDINGS: Lower chest: Lung bases are free of acute infiltrate or sizable effusion. Hepatobiliary: No focal liver abnormality is seen. No gallstones, gallbladder wall thickening, or biliary dilatation. Pancreas: Unremarkable. No pancreatic ductal dilatation or surrounding inflammatory changes. Spleen: Normal in size without focal abnormality. Adrenals/Urinary Tract: Adrenal glands are within normal limits. Nonobstructing right renal calculi noted and stable. The right ureter is  within normal limits. Left kidney demonstrates significant perinephric stranding with migration of 1 of the upper pole stones into the proximal left ureter. This calculus measures up to 8 mm. Small punctate nonobstructing stones remain in the left collecting system. The distal left ureter is within normal limits. Bladder is decompressed. Stomach/Bowel: Diverticulosis without diverticulitis is noted. Postsurgical changes in the right colon are seen. No obstructive changes are noted. The appendix has been surgically. Small bowel and stomach are unremarkable. Vascular/Lymphatic: Atherosclerotic calcifications of the aorta are noted. Aortic stent graft is seen and stable from the prior exam. No lymphadenopathy is noted. Reproductive: Prostate is unremarkable. Other: No abdominal wall hernia or abnormality. No abdominopelvic ascites. Musculoskeletal: No acute or significant osseous findings. IMPRESSION: Interval migration of an 8 mm stone from the left kidney into the proximal left ureter with obstructive change. Bilateral nonobstructing renal calculi stable from the prior exam. Diverticulosis without diverticulitis. Prior endovascular stent graft therapy of the abdominal aorta. No aneurysmal dilatation is seen. 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 Electronically Signed   By: Violeta Grey M.D.   On: 09/30/2023 19:45   Scheduled Meds:  cyanocobalamin   1,000 mcg Intramuscular Q30 days   dextromethorphan-guaiFENesin   1 tablet Oral BID   Continuous Infusions:  cefTRIAXone  (ROCEPHIN )  IV     lactated ringers  Stopped (10/01/23 0227)   lactated ringers  Stopped (10/01/23 0120)   lactated ringers  100 mL/hr at 10/01/23 0617    LOS: 1 day   Colin Dawley M.D on 10/01/2023 at 10:03 AM  Go to [email or web address] - for contact info  [facility] - Office  [phone]  If 7PM-7AM, please contact night-coverage [email or web address] 10/01/2023, 10:03 AM

## 2023-10-01 NOTE — Anesthesia Preprocedure Evaluation (Addendum)
 Anesthesia Evaluation  Patient identified by MRN, date of birth, ID band Patient awake    Reviewed: Allergy & Precautions, NPO status , Patient's Chart, lab work & pertinent test results  Airway Mallampati: II  TM Distance: >3 FB Neck ROM: Full    Dental  (+) Chipped, Missing   Pulmonary COPD,  COPD inhaler, Current Smoker and Patient abstained from smoking.   Pulmonary exam normal        Cardiovascular hypertension, Pt. on home beta blockers + angina  + CAD, + Past MI and + Peripheral Vascular Disease  Normal cardiovascular exam     Neuro/Psych negative neurological ROS  negative psych ROS   GI/Hepatic negative GI ROS, Neg liver ROS,,,  Endo/Other    Class 3 obesity  Renal/GU Renal disease     Musculoskeletal negative musculoskeletal ROS (+)    Abdominal  (+) + obese  Peds  Hematology negative hematology ROS (+)   Anesthesia Other Findings LEFT URETERAL STONE  Reproductive/Obstetrics                             Anesthesia Physical Anesthesia Plan  ASA: 4 and emergent  Anesthesia Plan: General   Post-op Pain Management:    Induction: Intravenous  PONV Risk Score and Plan: 1 and Ondansetron , Dexamethasone , Midazolam  and Treatment may vary due to age or medical condition  Airway Management Planned: LMA  Additional Equipment:   Intra-op Plan:   Post-operative Plan: Extubation in OR  Informed Consent: I have reviewed the patients History and Physical, chart, labs and discussed the procedure including the risks, benefits and alternatives for the proposed anesthesia with the patient or authorized representative who has indicated his/her understanding and acceptance.     Dental advisory given  Plan Discussed with: CRNA  Anesthesia Plan Comments:         Anesthesia Quick Evaluation

## 2023-10-01 NOTE — ED Notes (Signed)
 Dr  Quintella Buck aware of patients audible crackles and exertion of breathing and patient BP.

## 2023-10-01 NOTE — ED Notes (Signed)
 Lab at bedside

## 2023-10-01 NOTE — ED Notes (Signed)
 Manuel BP taken on right arm. 70/40. Attending aware.

## 2023-10-01 NOTE — Transfer of Care (Signed)
 Immediate Anesthesia Transfer of Care Note  Patient: Vincent Griffin  Procedure(s) Performed: CYSTOSCOPY, WITH RETROGRADE PYELOGRAM AND URETERAL STENT INSERTION (Left: Ureter)  Patient Location: PACU  Anesthesia Type:General  Level of Consciousness: drowsy  Airway & Oxygen Therapy: Patient Spontanous Breathing and Patient connected to face mask  Post-op Assessment: Report given to RN and Post -op Vital signs reviewed and stable  Post vital signs: Reviewed and stable  Last Vitals:  Vitals Value Taken Time  BP    Temp    Pulse 93 10/01/23 1541  Resp 20 10/01/23 1541  SpO2 97 % 10/01/23 1541  Vitals shown include unfiled device data.  Last Pain:  Vitals:   10/01/23 1400  TempSrc: Oral  PainSc: 5          Complications: No notable events documented.

## 2023-10-01 NOTE — Anesthesia Procedure Notes (Signed)
 Procedure Name: LMA Insertion Date/Time: 10/01/2023 3:07 PM  Performed by: Rochell Chroman, CRNAPre-anesthesia Checklist: Emergency Drugs available, Patient identified, Suction available and Patient being monitored Patient Re-evaluated:Patient Re-evaluated prior to induction Oxygen Delivery Method: Circle system utilized Preoxygenation: Pre-oxygenation with 100% oxygen Induction Type: IV induction Ventilation: Mask ventilation without difficulty LMA: LMA inserted LMA Size: 5.0 Number of attempts: 1 Placement Confirmation: positive ETCO2 and breath sounds checked- equal and bilateral Tube secured with: Tape Dental Injury: Teeth and Oropharynx as per pre-operative assessment

## 2023-10-01 NOTE — Anesthesia Postprocedure Evaluation (Signed)
 Anesthesia Post Note  Patient: Vincent Griffin  Procedure(s) Performed: CYSTOSCOPY, WITH RETROGRADE PYELOGRAM AND URETERAL STENT INSERTION (Left: Ureter)     Patient location during evaluation: PACU Anesthesia Type: General Level of consciousness: awake Pain management: pain level controlled Vital Signs Assessment: post-procedure vital signs reviewed and stable Respiratory status: spontaneous breathing, nonlabored ventilation and respiratory function stable Cardiovascular status: blood pressure returned to baseline and stable Postop Assessment: no apparent nausea or vomiting Anesthetic complications: no   No notable events documented.  Last Vitals:  Vitals:   10/01/23 1530 10/01/23 1545  BP: 106/61 (!) 94/58  Pulse: 94 99  Resp: 19 20  Temp: 37.5 C   SpO2: 96% 96%    Last Pain:  Vitals:   10/01/23 1600  TempSrc:   PainSc: 0-No pain                 Khloey Chern P Karolyna Bianchini

## 2023-10-01 NOTE — ED Notes (Signed)
 Pt's O2 dropped into the 80s while using urinal at bedside. Pt back in bed with O2 placed back on pt. Pt resting comfortably in bed.

## 2023-10-02 ENCOUNTER — Encounter (HOSPITAL_COMMUNITY): Payer: Self-pay | Admitting: Urology

## 2023-10-02 DIAGNOSIS — N390 Urinary tract infection, site not specified: Secondary | ICD-10-CM

## 2023-10-02 DIAGNOSIS — A419 Sepsis, unspecified organism: Secondary | ICD-10-CM | POA: Diagnosis not present

## 2023-10-02 LAB — COMPREHENSIVE METABOLIC PANEL WITH GFR
ALT: 34 U/L (ref 0–44)
AST: 56 U/L — ABNORMAL HIGH (ref 15–41)
Albumin: 2.3 g/dL — ABNORMAL LOW (ref 3.5–5.0)
Alkaline Phosphatase: 58 U/L (ref 38–126)
Anion gap: 9 (ref 5–15)
BUN: 24 mg/dL — ABNORMAL HIGH (ref 6–20)
CO2: 24 mmol/L (ref 22–32)
Calcium: 7.8 mg/dL — ABNORMAL LOW (ref 8.9–10.3)
Chloride: 101 mmol/L (ref 98–111)
Creatinine, Ser: 1.56 mg/dL — ABNORMAL HIGH (ref 0.61–1.24)
GFR, Estimated: 51 mL/min — ABNORMAL LOW (ref >60)
Glucose, Bld: 133 mg/dL — ABNORMAL HIGH (ref 70–99)
Potassium: 5.1 mmol/L (ref 3.5–5.1)
Sodium: 134 mmol/L — ABNORMAL LOW (ref 135–145)
Total Bilirubin: 0.6 mg/dL (ref 0.0–1.2)
Total Protein: 5.8 g/dL — ABNORMAL LOW (ref 6.5–8.1)

## 2023-10-02 LAB — CBC
HCT: 38.1 % — ABNORMAL LOW (ref 39.0–52.0)
Hemoglobin: 11.6 g/dL — ABNORMAL LOW (ref 13.0–17.0)
MCH: 32.6 pg (ref 26.0–34.0)
MCHC: 30.4 g/dL (ref 30.0–36.0)
MCV: 107 fL — ABNORMAL HIGH (ref 80.0–100.0)
Platelets: 127 10*3/uL — ABNORMAL LOW (ref 150–400)
RBC: 3.56 MIL/uL — ABNORMAL LOW (ref 4.22–5.81)
RDW: 13.7 % (ref 11.5–15.5)
WBC: 13.6 10*3/uL — ABNORMAL HIGH (ref 4.0–10.5)
nRBC: 0 % (ref 0.0–0.2)

## 2023-10-02 MED ORDER — IPRATROPIUM-ALBUTEROL 0.5-2.5 (3) MG/3ML IN SOLN
3.0000 mL | Freq: Three times a day (TID) | RESPIRATORY_TRACT | Status: DC
  Administered 2023-10-02 – 2023-10-03 (×4): 3 mL via RESPIRATORY_TRACT
  Filled 2023-10-02 (×5): qty 3

## 2023-10-02 MED ORDER — MIDODRINE HCL 5 MG PO TABS
2.5000 mg | ORAL_TABLET | Freq: Three times a day (TID) | ORAL | Status: DC
  Administered 2023-10-02 – 2023-10-03 (×3): 2.5 mg via ORAL
  Filled 2023-10-02 (×3): qty 1

## 2023-10-02 MED ORDER — CALCIUM CARBONATE ANTACID 500 MG PO CHEW
1.0000 | CHEWABLE_TABLET | Freq: Three times a day (TID) | ORAL | Status: DC | PRN
  Administered 2023-10-02: 200 mg via ORAL
  Filled 2023-10-02: qty 1

## 2023-10-02 NOTE — Progress Notes (Addendum)
 PROGRESS NOTE    Vincent Griffin  UJW:[id number] DOB: 01/15/1961 DOA: 09/30/2023 PCP: Yevette Hem, FNP   Brief Narrative:  63 y.o. 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 (status post Coil embolization right internal iliac artery (10 mm x 20 cm interlock and 8 mm x 20 cm interlock coils, Repair of right common iliac aneurysm with aortobiiliac stent graft, Placement of extension endovascular limb on the right on 09/08/2023), history of recurrent nephrolithiasis, history of cecal (colon) cancer s/p robotic assisted laparoscopic right hemicolectomy on 10/07/2022 , morbid obesity admitted on 09/30/2023 after presenting with left flank pain, dysuria, fever and leukocytosis and found to have sepsis secondary to gram-negative rod bacteremia and UTI with questionably infected nephrolithiasis.  Patient transferred to [facility] for urgent urological intervention.  Hospitalist called for admission.  5/18 - Patient tolerated urological procedure on 10/01/2023 including cystoscopy, retrograde pyelogram and ureteral stent on the left.   5/19 - Septic shock resolving   Assessment & Plan:   Principal Problem:   Severe GNR Sepsis secondary to UTI [facility]) Active Problems:   Lactic acidosis   Nephrolithiasis-8 mm stone in proximal left ureter with obstructive change.   Acute respiratory failure with hypoxia (HCC)   AKI (acute kidney injury) (HCC)   Tobacco abuse   COPD (chronic obstructive pulmonary disease) (HCC)   CAD (coronary artery disease)   Aneurysm artery, iliac (HCC)   Iliac artery aneurysm (HCC)   Elevated MCV   Obesity, Class III, BMI 40-49.9 (morbid obesity)   Severe sepsis (HCC)  Septic shock Lactic acidosis Gram negative bacteremia/UTI(enterobacter/proteus), POA Obstructive 8mm left nephrolithiasis - Leukocytosis, tachycardia, source as above - required multiple doses  of epinephrine  in the OR to maintain MAP greater than 65 -patient remains somewhat hypotensive postoperatively improving with IV fluids and p.o. midodrine   -Wean midodrine , taper over the next 48 hours, stop date 10/04/2023 - Blood cultures remarkable for gram-negative bacteremia, presumed Enterobacter/Proteus per BC ID, urine culture also showing Proteus - Continue current regimen including ceftriaxone  -de-escalate as appropriate  Recurrent nephrolithiasis - Presumed infected as above - Left ureteral stent placed - Imaging confirms 8 mm stone, notable obstruction on imaging   COPD/tobacco abuse--now with acute hypoxic respiratory failure -Likely multifactorial in the setting of septic shock versus partially iatrogenic in the setting of profound volume resuscitation given above - Oxygen weaning appropriately over the last 24 hours  -Baseline room air, will continue to wean as appropriate -expect patient to be discharged off oxygen   AKI without history of CKD, multifactorial Secondary to obstructive uropathy, hypoperfusion in the setting of shock, sepsis - Secondary to above, improving drastically with IV fluids - Baseline 1.0, peak 2.1  - downtrending currently 1.5   PAD----Prior endovascular stent graft therapy of the abdominal aorta - Recent evaluation with Dr. Fulton Job on 09/08/2023 - Continue aspirin  and atorvastatin    Hypomagnesemia/hyponatremia-replace as appropriate, improving with p.o. intake   CAD--history of prior MI -Without signs or symptoms of ACS - Continue aspirin , statin - Hold metoprolol  due to hypotension - Echo: EF 65-70% without wall motion abnormalities, grade 1 diastolic dysfunction   )Morbid Obesity- Body mass index is 45.61 kg/m.   DVT prophylaxis: heparin  injection 5,000 Units Start: 10/01/23 1400 SCDs Start: 10/01/23 1014 Place TED hose Start: 10/01/23 1014 SCDs Start: 10/01/23 0116   Code Status:   Code Status: Full Code  Family Communication: None  present  Status is: Inpatient  Dispo: The patient is from: Home              Anticipated d/c is to: Home              Anticipated d/c date is: 24 to 48 hours              Patient currently not medically stable for discharge  Consultants:  Urology  Procedures:  Cystoscopy ureteral stent, left  Antimicrobials:  Ceftriaxone   Subjective: No acute issues or events overnight denies nausea vomiting diarrhea constipation headache fevers chills chest pain shortness of breath  Objective: Vitals:   10/02/23 0108 10/02/23 0500 10/02/23 0515 10/02/23 0700  BP: 101/64     Pulse: 73     Resp: 16     Temp:   97.6 F (36.4 C) 97.6 F (36.4 C)  TempSrc:   Oral Oral  SpO2: 99%     Weight:  (!) 144.2 kg    Height:        Intake/Output Summary (Last 24 hours) at 10/02/2023 0702 Last data filed at 10/02/2023 3244 Gross per 24 hour  Intake 4830.39 ml  Output 1525 ml  Net 3305.39 ml   Filed Weights   09/30/23 1727 10/02/23 0500  Weight: (!) 140.6 kg (!) 144.2 kg    Examination:  General exam: Appears calm and comfortable  Respiratory system: Clear to auscultation. Respiratory effort normal. Cardiovascular system: S1 & S2 heard, RRR. No JVD, murmurs, rubs, gallops or clicks. No pedal edema. Gastrointestinal system: Abdomen is nondistended, soft and nontender. No organomegaly or masses felt. Normal bowel sounds heard. Central nervous system: Alert and oriented. No focal neurological deficits. Extremities: Symmetric 5 x 5 power. Skin: No rashes, lesions or ulcers Psychiatry: Judgement and insight appear normal. Mood & affect appropriate.     Data Reviewed: I have personally reviewed following labs and imaging studies  CBC: Recent Labs  Lab 09/30/23 1745 10/01/23 0559 10/02/23 0239  WBC 11.3* 12.7* 13.6*  HGB 14.8 13.3 11.6*  HCT 46.3 41.9 38.1*  MCV 103.8* 105.0* 107.0*  PLT 271 158 127*   Basic Metabolic Panel: Recent Labs  Lab 09/30/23 1745 09/30/23 1803  10/01/23 0559 10/02/23 0239  NA 137 137 133* 134*  K 4.2 4.0 4.2 5.1  CL 101 98 101 101  CO2 27 25 22 24   GLUCOSE 117* 123* 112* 133*  BUN 14 14 19  24*  CREATININE 1.41* 1.40* 2.08* 1.56*  CALCIUM  8.8* 8.6* 7.9* 7.8*  MG  --   --  1.5*  --   PHOS  --   --  3.1  --    GFR: Estimated Creatinine Clearance: 72.3 mL/min (A) (by C-G formula based on SCr of 1.56 mg/dL (H)). Liver Function Tests: Recent Labs  Lab 09/30/23 1803 10/01/23 0559 10/02/23 0239  AST 39 46* 56*  ALT 38 33 34  ALKPHOS 114 77 58  BILITOT 0.8 0.8 0.6  PROT 7.8 6.6 5.8*  ALBUMIN  3.4* 2.8* 2.3*   No results for input(s): "LIPASE", "AMYLASE" in the last 168 hours. No results for input(s): "AMMONIA" in the last 168 hours. Coagulation Profile: Recent Labs  Lab 09/30/23 1803  INR 1.0   Cardiac Enzymes: No results for input(s): "CKTOTAL", "CKMB", "CKMBINDEX", "TROPONINI" in the last 168 hours. BNP (last 3 results) No results for input(s): "PROBNP" in the last 8760 hours. HbA1C: No results for input(s): "HGBA1C" in the last 72 hours. CBG: No results for input(s): "GLUCAP" in  the last 168 hours. Lipid Profile: No results for input(s): "CHOL", "HDL", "LDLCALC", "TRIG", "CHOLHDL", "LDLDIRECT" in the last 72 hours. Thyroid Function Tests: No results for input(s): "TSH", "T4TOTAL", "FREET4", "T3FREE", "THYROIDAB" in the last 72 hours. Anemia Panel: Recent Labs    10/01/23 0559  VITAMINB12 326  FOLATE 7.0   Sepsis Labs: Recent Labs  Lab 10/01/23 0136 10/01/23 0559 10/01/23 0925 10/01/23 1233  LATICACIDVEN 3.6* 4.4* 3.0* 2.5*    Recent Results (from the past 240 hours)  Blood Culture (routine x 2)     Status: None (Preliminary result)   Collection Time: 09/30/23  6:03 PM   Specimen: Right Antecubital; Blood  Result Value Ref Range Status   Specimen Description   Final    RIGHT ANTECUBITAL Performed at [facility], [address] [place], [place] [address]    Special Requests   Final     BOTTLES DRAWN AEROBIC AND ANAEROBIC Blood Culture adequate volume Performed at [facility], [address], [place], [place] [address]    Culture  Setup Time   Final    GRAM NEGATIVE RODS ANAEROBIC BOTTLE ONLY Gram Stain Report Called to,Read Back By and Verified With: Burrell Casa AT 0808 ON [id number] BY S DALTON CRITICAL RESULT CALLED TO, READ BACK BY AND VERIFIED WITH: PHARMD MICHELLE BELL [id number] AT 1356 BY EC Performed at [facility], [address], [place], [place] [address]    Culture GRAM NEGATIVE RODS  Final   Report Status PENDING  Incomplete  Blood Culture ID Panel (Reflexed)     Status: Abnormal   Collection Time: 09/30/23  6:03 PM  Result Value Ref Range Status   Enterococcus faecalis NOT DETECTED NOT DETECTED Final   Enterococcus Faecium NOT DETECTED NOT DETECTED Final   Listeria monocytogenes NOT DETECTED NOT DETECTED Final   Staphylococcus species NOT DETECTED NOT DETECTED Final   Staphylococcus aureus (BCID) NOT DETECTED NOT DETECTED Final   Staphylococcus epidermidis NOT DETECTED NOT DETECTED Final   Staphylococcus lugdunensis NOT DETECTED NOT DETECTED Final   Streptococcus species NOT DETECTED NOT DETECTED Final   Streptococcus agalactiae NOT DETECTED NOT DETECTED Final   Streptococcus pneumoniae NOT DETECTED NOT DETECTED Final   Streptococcus pyogenes NOT DETECTED NOT DETECTED Final   A.calcoaceticus-baumannii NOT DETECTED NOT DETECTED Final   Bacteroides fragilis NOT DETECTED NOT DETECTED Final   Enterobacterales DETECTED (A) NOT DETECTED Final    Comment: Enterobacterales represent a large order of gram negative bacteria, not a single organism. CRITICAL RESULT CALLED TO, READ BACK BY AND VERIFIED WITH: PHARMD MICHELLE BELL [id number] AT 1356 BY EC    Enterobacter cloacae complex NOT DETECTED NOT DETECTED Final   Escherichia coli NOT DETECTED NOT DETECTED Final   Klebsiella aerogenes NOT DETECTED NOT DETECTED Final   Klebsiella oxytoca NOT DETECTED NOT  DETECTED Final   Klebsiella pneumoniae NOT DETECTED NOT DETECTED Final   Proteus species DETECTED (A) NOT DETECTED Final    Comment: CRITICAL RESULT CALLED TO, READ BACK BY AND VERIFIED WITH: PHARMD MICHELLE BELL [id number] AY 1356 BY EC    Salmonella species NOT DETECTED NOT DETECTED Final   Serratia marcescens NOT DETECTED NOT DETECTED Final   Haemophilus influenzae NOT DETECTED NOT DETECTED Final   Neisseria meningitidis NOT DETECTED NOT DETECTED Final   Pseudomonas aeruginosa NOT DETECTED NOT DETECTED Final   Stenotrophomonas maltophilia NOT DETECTED NOT DETECTED Final   Candida albicans NOT DETECTED NOT DETECTED Final   Candida auris NOT DETECTED NOT DETECTED Final   Candida  glabrata NOT DETECTED NOT DETECTED Final   Candida krusei NOT DETECTED NOT DETECTED Final   Candida parapsilosis NOT DETECTED NOT DETECTED Final   Candida tropicalis NOT DETECTED NOT DETECTED Final   Cryptococcus neoformans/gattii NOT DETECTED NOT DETECTED Final   CTX-M ESBL NOT DETECTED NOT DETECTED Final   Carbapenem resistance IMP NOT DETECTED NOT DETECTED Final   Carbapenem resistance KPC NOT DETECTED NOT DETECTED Final   Carbapenem resistance NDM NOT DETECTED NOT DETECTED Final   Carbapenem resist OXA 48 LIKE NOT DETECTED NOT DETECTED Final   Carbapenem resistance VIM NOT DETECTED NOT DETECTED Final    Comment: Performed at [facility], [address], [place], [place] [address]  Blood Culture (routine x 2)     Status: None (Preliminary result)   Collection Time: 09/30/23  6:05 PM   Specimen: Right Antecubital; Blood  Result Value Ref Range Status   Specimen Description   Final    RIGHT ANTECUBITAL Performed at [facility], [address] [place], [place] [address]    Special Requests   Final    BOTTLES DRAWN AEROBIC AND ANAEROBIC Blood Culture adequate volume Performed at [facility], [address] [place], [place] [address]    Culture  Setup Time   Final    GRAM NEGATIVE RODS ANAEROBIC  BOTTLE ONLY Gram Stain Report Called to,Read Back By and Verified With: Burrell Casa AT 0808 ON [id number] BY S DALTON CRITICAL VALUE NOTED.  VALUE IS CONSISTENT WITH PREVIOUSLY REPORTED AND CALLED VALUE. Performed at [facility], [address] [place], [place] [address]    Culture GRAM NEGATIVE RODS  Final   Report Status PENDING  Incomplete  MRSA Next Gen by PCR, Nasal     Status: None   Collection Time: 10/01/23  2:11 PM   Specimen: Nasal Mucosa; Nasal Swab  Result Value Ref Range Status   MRSA by PCR Next Gen NOT DETECTED NOT DETECTED Final    Comment: (NOTE) The GeneXpert MRSA Assay (FDA approved for NASAL specimens only), is one component of a comprehensive MRSA colonization surveillance program. It is not intended to diagnose MRSA infection nor to guide or monitor treatment for MRSA infections. Test performance is not FDA approved in patients less than 78 years old. Performed at [facility], [address], [place], [place] [address]          Radiology Studies: DG C-Arm 1-60 Min-No Report Result Date: 10/01/2023 Fluoroscopy was utilized by the requesting physician.  No radiographic interpretation.   ECHOCARDIOGRAM COMPLETE Result Date: 10/01/2023    ECHOCARDIOGRAM REPORT   Patient Name:   Vincent Griffin Date of Exam: 10/01/2023 Medical Rec #:  [id number]    Height:       70.0 in Accession #:    [id number]   Weight:       310.0 lb Date of Birth:  1961-03-23    BSA:          2.515 m Patient Age:    62 years     BP:           87/60 mmHg Patient Gender: M            HR:           94 bpm. Exam Location:  [facility] Procedure: 2D Echo, Cardiac Doppler, Color Doppler and Intracardiac            Opacification Agent (Both Spectral and Color Flow Doppler were  utilized during procedure). Indications:    CHF-Acute Diastolic  History:        Patient has no prior history of Echocardiogram examinations. CAD                 and Previous Myocardial Infarction,  COPD; Risk Factors:Current                 Smoker and Hypertension.  Sonographer:    Willey Harrier Referring Phys: [id number] OLADAPO ADEFESO  Sonographer Comments: Technically difficult study due to poor echo windows and patient is obese. Image acquisition challenging due to patient body habitus and Image acquisition challenging due to respiratory motion. IMPRESSIONS  1. Left ventricular ejection fraction, by estimation, is 65 to 70%. The left ventricle has normal function. The left ventricle has no regional wall motion abnormalities. Left ventricular diastolic parameters are consistent with Grade I diastolic dysfunction (impaired relaxation).  2. Right ventricular systolic function is normal. The right ventricular size is normal.  3. The mitral valve is normal in structure. No evidence of mitral valve regurgitation. No evidence of mitral stenosis.  4. The aortic valve was not well visualized. Aortic valve regurgitation is not visualized. No aortic stenosis is present.  5. The inferior vena cava is normal in size with greater than 50% respiratory variability, suggesting right atrial pressure of 3 mmHg. Conclusion(s)/Recommendation(s): Technically limited study due to poor sound wave transmission. FINDINGS  Left Ventricle: Left ventricular ejection fraction, by estimation, is 65 to 70%. The left ventricle has normal function. The left ventricle has no regional wall motion abnormalities. Definity  contrast agent was given IV to delineate the left ventricular  endocardial borders. The left ventricular internal cavity size was normal in size. There is no left ventricular hypertrophy. Left ventricular diastolic parameters are consistent with Grade I diastolic dysfunction (impaired relaxation). Right Ventricle: The right ventricular size is normal. No increase in right ventricular wall thickness. Right ventricular systolic function is normal. Left Atrium: Left atrial size was normal in size. Right Atrium: Right atrial size  was normal in size. Pericardium: There is no evidence of pericardial effusion. Mitral Valve: The mitral valve is normal in structure. No evidence of mitral valve regurgitation. No evidence of mitral valve stenosis. MV peak gradient, 3.2 mmHg. The mean mitral valve gradient is 2.0 mmHg. Tricuspid Valve: The tricuspid valve is not well visualized. Tricuspid valve regurgitation is trivial. No evidence of tricuspid stenosis. Aortic Valve: The aortic valve was not well visualized. Aortic valve regurgitation is not visualized. No aortic stenosis is present. Aortic valve peak gradient measures 10.6 mmHg. Pulmonic Valve: The pulmonic valve was normal in structure. Pulmonic valve regurgitation is not visualized. No evidence of pulmonic stenosis. Aorta: The aortic root is normal in size and structure. Venous: The inferior vena cava is normal in size with greater than 50% respiratory variability, suggesting right atrial pressure of 3 mmHg. IAS/Shunts: No atrial level shunt detected by color flow Doppler.  LEFT VENTRICLE PLAX 2D LVIDd:         5.30 cm   Diastology LVIDs:         4.10 cm   LV e' medial:    9.14 cm/s LV PW:         0.90 cm   LV E/e' medial:  7.0 LV IVS:        0.80 cm   LV e' lateral:   12.30 cm/s LVOT diam:     2.20 cm   LV E/e' lateral: 5.2 LV SV:  79 LV SV Index:   31 LVOT Area:     3.80 cm  RIGHT VENTRICLE             IVC RV S prime:     11.70 cm/s  IVC diam: 2.00 cm TAPSE (M-mode): 2.8 cm LEFT ATRIUM             Index        RIGHT ATRIUM           Index LA Vol (A2C):   54.6 ml 21.71 ml/m  RA Area:     21.20 cm LA Vol (A4C):   39.5 ml 15.71 ml/m  RA Volume:   61.00 ml  24.26 ml/m LA Biplane Vol: 48.8 ml 19.41 ml/m  AORTIC VALVE AV Area (Vmax): 2.45 cm AV Vmax:        163.00 cm/s AV Peak Grad:   10.6 mmHg LVOT Vmax:      105.00 cm/s LVOT Vmean:     77.800 cm/s LVOT VTI:       0.208 m  AORTA Ao Root diam: 3.60 cm Ao Asc diam:  3.80 cm MITRAL VALVE MV Area (PHT): 3.53 cm    SHUNTS MV Area VTI:    4.20 cm    Systemic VTI:  0.21 m MV Peak grad:  3.2 mmHg    Systemic Diam: 2.20 cm MV Mean grad:  2.0 mmHg MV Vmax:       0.89 m/s MV Vmean:      61.4 cm/s MV Decel Time: 215 msec MV E velocity: 64.30 cm/s MV A velocity: 82.70 cm/s MV E/A ratio:  0.78 Jules Oar MD Electronically signed by Jules Oar MD Signature Date/Time: 10/01/2023/3:21:42 PM    Final    DG Chest Port 1 View Result Date: 09/30/2023 CLINICAL DATA:  Possible sepsis EXAM: PORTABLE CHEST 1 VIEW COMPARISON:  09/08/2023 FINDINGS: Cardiac shadow is enlarged. Mild aortic calcifications are seen. Persistent interstitial opacities are noted stable from the prior exam. No focal confluent infiltrate is seen. No bony abnormality is noted. IMPRESSION: Stable appearance when compared with the prior exam. Electronically Signed   By: Violeta Grey M.D.   On: 09/30/2023 19:46   CT Renal Stone Study Result Date: 09/30/2023 CLINICAL DATA:  Left flank pain EXAM: CT ABDOMEN AND PELVIS WITHOUT CONTRAST TECHNIQUE: Multidetector CT imaging of the abdomen and pelvis was performed following the standard protocol without IV contrast. RADIATION DOSE REDUCTION: This exam was performed according to the departmental dose-optimization program which includes automated exposure control, adjustment of the mA and/or kV according to patient size and/or use of iterative reconstruction technique. COMPARISON:  09/27/2023 FINDINGS: Lower chest: Lung bases are free of acute infiltrate or sizable effusion. Hepatobiliary: No focal liver abnormality is seen. No gallstones, gallbladder wall thickening, or biliary dilatation. Pancreas: Unremarkable. No pancreatic ductal dilatation or surrounding inflammatory changes. Spleen: Normal in size without focal abnormality. Adrenals/Urinary Tract: Adrenal glands are within normal limits. Nonobstructing right renal calculi noted and stable. The right ureter is within normal limits. Left kidney demonstrates significant perinephric  stranding with migration of 1 of the upper pole stones into the proximal left ureter. This calculus measures up to 8 mm. Small punctate nonobstructing stones remain in the left collecting system. The distal left ureter is within normal limits. Bladder is decompressed. Stomach/Bowel: Diverticulosis without diverticulitis is noted. Postsurgical changes in the right colon are seen. No obstructive changes are noted. The appendix has been surgically. Small bowel and stomach are unremarkable. Vascular/Lymphatic: Atherosclerotic calcifications  of the aorta are noted. Aortic stent graft is seen and stable from the prior exam. No lymphadenopathy is noted. Reproductive: Prostate is unremarkable. Other: No abdominal wall hernia or abnormality. No abdominopelvic ascites. Musculoskeletal: No acute or significant osseous findings. IMPRESSION: Interval migration of an 8 mm stone from the left kidney into the proximal left ureter with obstructive change. Bilateral nonobstructing renal calculi stable from the prior exam. Diverticulosis without diverticulitis. Prior endovascular stent graft therapy of the abdominal aorta. No aneurysmal dilatation is seen. 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 Electronically Signed   By: Violeta Grey M.D.   On: 09/30/2023 19:45        Scheduled Meds:  aspirin   81 mg Oral Daily   atorvastatin   80 mg Oral QHS   budesonide -glycopyrrolate -formoterol   2 puff Inhalation BID   Chlorhexidine  Gluconate Cloth  6 each Topical Daily   dextromethorphan -guaiFENesin   1 tablet Oral BID   heparin   5,000 Units Subcutaneous Q8H   ipratropium-albuterol   3 mL Nebulization TID   midodrine   5 mg Oral TID WC   nicotine   21 mg Transdermal Daily   sodium chloride  flush  3 mL Intravenous Q12H   sodium chloride  flush  3 mL Intravenous Q12H   Continuous Infusions:  sodium chloride      cefTRIAXone  (ROCEPHIN )  IV Stopped (10/01/23 2252)   lactated ringers  Stopped (10/01/23  0227)   lactated ringers  150 mL/hr at 10/02/23 0621     LOS: 2 days   Time spent:  Haydee Lipa, DO [facility]  If 7PM-7AM, please contact night-coverage [email or web address]  10/02/2023, 7:02 AM

## 2023-10-02 NOTE — Progress Notes (Signed)
 1 Day Post-Op Subjective: First time meeting Vincent Griffin. He feels much better this am. Experiencing some discomfort at the end of urination. Discussed the nature of ureteral stents and potential for ureteral reflux, as well as next steps for definitive stone mgmt.   Objective: Vital signs in last 24 hours: Temp:  [97.6 F (36.4 C)-101.8 F (38.8 C)] 97.6 F (36.4 C) (05/19 0700) Pulse Rate:  [69-99] 69 (05/19 0724) Resp:  [12-25] 19 (05/19 0724) BP: (73-112)/(41-71) 101/64 (05/19 0108) SpO2:  [87 %-100 %] 97 % (05/19 0724) Weight:  [144.2 kg] 144.2 kg (05/19 0500)  Assessment/Plan: #sepsis #51mm left obstructing ureteral stone and associated hydronephrosis #AKI  To the OR for left ureteral stent placement with [facility] 5/18. Trend labs, SCr improving today. Should normalize in the near future. Encourage oral hydration.  BC (+) for proteus and enterococcus. Plan for ureteroscopy and laser lithotripsy once patient has completed ABX treatment in a few weeks.  Please call with questions.   [facility] [address] floor [place], [place]  [address] [phone]   Intake/Output from previous day: 05/18 0701 - 05/19 0700 In: 4830.4 [P.O.:240; I.V.:2290.4; IV Piggyback:2300] Out: 1825 [Urine:1225; Blood:600]  Intake/Output this shift: No intake/output data recorded.  Physical Exam:  General: Alert and oriented CV: No cyanosis Lungs: equal chest rise Abdomen: Soft, NTND, no rebound or guarding  Lab Results: Recent Labs    09/30/23 1745 10/01/23 0559 10/02/23 0239  HGB 14.8 13.3 11.6*  HCT 46.3 41.9 38.1*   BMET Recent Labs    10/01/23 0559 10/02/23 0239  NA 133* 134*  K 4.2 5.1  CL 101 101  CO2 22 24  GLUCOSE 112* 133*  BUN 19 24*  CREATININE 2.08* 1.56*  CALCIUM  7.9* 7.8*  HGB 13.3 11.6*  WBC 12.7* 13.6*     Studies/Results: DG C-Arm 1-60 Min-No Report Result Date: 10/01/2023 Fluoroscopy was utilized by the  requesting physician.  No radiographic interpretation.   ECHOCARDIOGRAM COMPLETE Result Date: 10/01/2023    ECHOCARDIOGRAM REPORT   Patient Name:   Vincent Griffin Date of Exam: 10/01/2023 Medical Rec #:  [id number]    Height:       70.0 in Accession #:    [id number]   Weight:       310.0 lb Date of Birth:  1960-06-18    BSA:          2.515 m Patient Age:    63 years     BP:           87/60 mmHg Patient Gender: M            HR:           94 bpm. Exam Location:  [facility] Procedure: 2D Echo, Cardiac Doppler, Color Doppler and Intracardiac            Opacification Agent (Both Spectral and Color Flow Doppler were            utilized during procedure). Indications:    CHF-Acute Diastolic  History:        Patient has no prior history of Echocardiogram examinations. CAD                 and Previous Myocardial Infarction, COPD; Risk Factors:Current                 Smoker and Hypertension.  Sonographer:    Willey Harrier Referring Phys: [id number] OLADAPO ADEFESO  Sonographer Comments: Technically difficult study  due to poor echo windows and patient is obese. Image acquisition challenging due to patient body habitus and Image acquisition challenging due to respiratory motion. IMPRESSIONS  1. Left ventricular ejection fraction, by estimation, is 65 to 70%. The left ventricle has normal function. The left ventricle has no regional wall motion abnormalities. Left ventricular diastolic parameters are consistent with Grade I diastolic dysfunction (impaired relaxation).  2. Right ventricular systolic function is normal. The right ventricular size is normal.  3. The mitral valve is normal in structure. No evidence of mitral valve regurgitation. No evidence of mitral stenosis.  4. The aortic valve was not well visualized. Aortic valve regurgitation is not visualized. No aortic stenosis is present.  5. The inferior vena cava is normal in size with greater than 50% respiratory variability, suggesting right atrial pressure of 3 mmHg.  Conclusion(s)/Recommendation(s): Technically limited study due to poor sound wave transmission. FINDINGS  Left Ventricle: Left ventricular ejection fraction, by estimation, is 65 to 70%. The left ventricle has normal function. The left ventricle has no regional wall motion abnormalities. Definity  contrast agent was given IV to delineate the left ventricular  endocardial borders. The left ventricular internal cavity size was normal in size. There is no left ventricular hypertrophy. Left ventricular diastolic parameters are consistent with Grade I diastolic dysfunction (impaired relaxation). Right Ventricle: The right ventricular size is normal. No increase in right ventricular wall thickness. Right ventricular systolic function is normal. Left Atrium: Left atrial size was normal in size. Right Atrium: Right atrial size was normal in size. Pericardium: There is no evidence of pericardial effusion. Mitral Valve: The mitral valve is normal in structure. No evidence of mitral valve regurgitation. No evidence of mitral valve stenosis. MV peak gradient, 3.2 mmHg. The mean mitral valve gradient is 2.0 mmHg. Tricuspid Valve: The tricuspid valve is not well visualized. Tricuspid valve regurgitation is trivial. No evidence of tricuspid stenosis. Aortic Valve: The aortic valve was not well visualized. Aortic valve regurgitation is not visualized. No aortic stenosis is present. Aortic valve peak gradient measures 10.6 mmHg. Pulmonic Valve: The pulmonic valve was normal in structure. Pulmonic valve regurgitation is not visualized. No evidence of pulmonic stenosis. Aorta: The aortic root is normal in size and structure. Venous: The inferior vena cava is normal in size with greater than 50% respiratory variability, suggesting right atrial pressure of 3 mmHg. IAS/Shunts: No atrial level shunt detected by color flow Doppler.  LEFT VENTRICLE PLAX 2D LVIDd:         5.30 cm   Diastology LVIDs:         4.10 cm   LV e' medial:    9.14  cm/s LV PW:         0.90 cm   LV E/e' medial:  7.0 LV IVS:        0.80 cm   LV e' lateral:   12.30 cm/s LVOT diam:     2.20 cm   LV E/e' lateral: 5.2 LV SV:         79 LV SV Index:   31 LVOT Area:     3.80 cm  RIGHT VENTRICLE             IVC RV S prime:     11.70 cm/s  IVC diam: 2.00 cm TAPSE (M-mode): 2.8 cm LEFT ATRIUM             Index        RIGHT ATRIUM  Index LA Vol (A2C):   54.6 ml 21.71 ml/m  RA Area:     21.20 cm LA Vol (A4C):   39.5 ml 15.71 ml/m  RA Volume:   61.00 ml  24.26 ml/m LA Biplane Vol: 48.8 ml 19.41 ml/m  AORTIC VALVE AV Area (Vmax): 2.45 cm AV Vmax:        163.00 cm/s AV Peak Grad:   10.6 mmHg LVOT Vmax:      105.00 cm/s LVOT Vmean:     77.800 cm/s LVOT VTI:       0.208 m  AORTA Ao Root diam: 3.60 cm Ao Asc diam:  3.80 cm MITRAL VALVE MV Area (PHT): 3.53 cm    SHUNTS MV Area VTI:   4.20 cm    Systemic VTI:  0.21 m MV Peak grad:  3.2 mmHg    Systemic Diam: 2.20 cm MV Mean grad:  2.0 mmHg MV Vmax:       0.89 m/s MV Vmean:      61.4 cm/s MV Decel Time: 215 msec MV E velocity: 64.30 cm/s MV A velocity: 82.70 cm/s MV E/A ratio:  0.78 Jules Oar MD Electronically signed by Jules Oar MD Signature Date/Time: 10/01/2023/3:21:42 PM    Final    DG Chest Port 1 View Result Date: 09/30/2023 CLINICAL DATA:  Possible sepsis EXAM: PORTABLE CHEST 1 VIEW COMPARISON:  09/08/2023 FINDINGS: Cardiac shadow is enlarged. Mild aortic calcifications are seen. Persistent interstitial opacities are noted stable from the prior exam. No focal confluent infiltrate is seen. No bony abnormality is noted. IMPRESSION: Stable appearance when compared with the prior exam. Electronically Signed   By: Violeta Grey M.D.   On: 09/30/2023 19:46   CT Renal Stone Study Result Date: 09/30/2023 CLINICAL DATA:  Left flank pain EXAM: CT ABDOMEN AND PELVIS WITHOUT CONTRAST TECHNIQUE: Multidetector CT imaging of the abdomen and pelvis was performed following the standard protocol without IV contrast.  RADIATION DOSE REDUCTION: This exam was performed according to the departmental dose-optimization program which includes automated exposure control, adjustment of the mA and/or kV according to patient size and/or use of iterative reconstruction technique. COMPARISON:  09/27/2023 FINDINGS: Lower chest: Lung bases are free of acute infiltrate or sizable effusion. Hepatobiliary: No focal liver abnormality is seen. No gallstones, gallbladder wall thickening, or biliary dilatation. Pancreas: Unremarkable. No pancreatic ductal dilatation or surrounding inflammatory changes. Spleen: Normal in size without focal abnormality. Adrenals/Urinary Tract: Adrenal glands are within normal limits. Nonobstructing right renal calculi noted and stable. The right ureter is within normal limits. Left kidney demonstrates significant perinephric stranding with migration of 1 of the upper pole stones into the proximal left ureter. This calculus measures up to 8 mm. Small punctate nonobstructing stones remain in the left collecting system. The distal left ureter is within normal limits. Bladder is decompressed. Stomach/Bowel: Diverticulosis without diverticulitis is noted. Postsurgical changes in the right colon are seen. No obstructive changes are noted. The appendix has been surgically. Small bowel and stomach are unremarkable. Vascular/Lymphatic: Atherosclerotic calcifications of the aorta are noted. Aortic stent graft is seen and stable from the prior exam. No lymphadenopathy is noted. Reproductive: Prostate is unremarkable. Other: No abdominal wall hernia or abnormality. No abdominopelvic ascites. Musculoskeletal: No acute or significant osseous findings. IMPRESSION: Interval migration of an 8 mm stone from the left kidney into the proximal left ureter with obstructive change. Bilateral nonobstructing renal calculi stable from the prior exam. Diverticulosis without diverticulitis. Prior endovascular stent graft therapy of the abdominal  aorta. No  aneurysmal dilatation is seen. 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 Electronically Signed   By: Violeta Grey M.D.   On: 09/30/2023 19:45      LOS: 2 days   Alla Ar, NP [facility] Pager: [phone]  10/02/2023, 8:57 AM

## 2023-10-02 NOTE — Progress Notes (Signed)
   10/02/23 0908  TOC Brief Assessment  Insurance and Status Reviewed  Patient has primary care physician Yes  Home environment has been reviewed single family home  Prior level of function: independent  Prior/Current Home Services No current home services  Social Drivers of Health Review SDOH reviewed no interventions necessary  Readmission risk has been reviewed Yes  Transition of care needs transition of care needs identified, TOC will continue to follow    TOC will continue to follow for possible O2 and discharge needs.  [place], MSW, LCSW 10/02/2023 9:09 AM

## 2023-10-02 NOTE — Plan of Care (Signed)
   Problem: Education: Goal: Knowledge of General Education information will improve Description Including pain rating scale, medication(s)/side effects and non-pharmacologic comfort measures Outcome: Progressing   Problem: Health Behavior/Discharge Planning: Goal: Ability to manage health-related needs will improve Outcome: Progressing

## 2023-10-03 ENCOUNTER — Other Ambulatory Visit (HOSPITAL_COMMUNITY): Payer: Self-pay

## 2023-10-03 DIAGNOSIS — N390 Urinary tract infection, site not specified: Secondary | ICD-10-CM | POA: Diagnosis not present

## 2023-10-03 DIAGNOSIS — A419 Sepsis, unspecified organism: Secondary | ICD-10-CM | POA: Diagnosis not present

## 2023-10-03 LAB — URINE CULTURE: Culture: >=100000 — AB

## 2023-10-03 LAB — BASIC METABOLIC PANEL WITH GFR
Anion gap: 10 (ref 5–15)
BUN: 27 mg/dL — ABNORMAL HIGH (ref 6–20)
CO2: 25 mmol/L (ref 22–32)
Calcium: 8.5 mg/dL — ABNORMAL LOW (ref 8.9–10.3)
Chloride: 103 mmol/L (ref 98–111)
Creatinine, Ser: 1.11 mg/dL (ref 0.61–1.24)
GFR, Estimated: >60 mL/min (ref >60)
Glucose, Bld: 102 mg/dL — ABNORMAL HIGH (ref 70–99)
Potassium: 4.4 mmol/L (ref 3.5–5.1)
Sodium: 138 mmol/L (ref 135–145)

## 2023-10-03 LAB — CULTURE, BLOOD (ROUTINE X 2)
Special Requests: ADEQUATE
Special Requests: ADEQUATE

## 2023-10-03 LAB — CBC
HCT: 40.3 % (ref 39.0–52.0)
Hemoglobin: 12.4 g/dL — ABNORMAL LOW (ref 13.0–17.0)
MCH: 32.6 pg (ref 26.0–34.0)
MCHC: 30.8 g/dL (ref 30.0–36.0)
MCV: 106.1 fL — ABNORMAL HIGH (ref 80.0–100.0)
Platelets: 131 10*3/uL — ABNORMAL LOW (ref 150–400)
RBC: 3.8 MIL/uL — ABNORMAL LOW (ref 4.22–5.81)
RDW: 13.9 % (ref 11.5–15.5)
WBC: 12.4 10*3/uL — ABNORMAL HIGH (ref 4.0–10.5)
nRBC: 0 % (ref 0.0–0.2)

## 2023-10-03 MED ORDER — CEFADROXIL 500 MG PO CAPS
1000.0000 mg | ORAL_CAPSULE | Freq: Two times a day (BID) | ORAL | 0 refills | Status: DC
  Filled 2023-10-03: qty 44, 11d supply, fill #0

## 2023-10-03 MED ORDER — CEFADROXIL 500 MG PO CAPS: 1000.0000 mg | ORAL_CAPSULE | Freq: Two times a day (BID) | ORAL | Status: DC

## 2023-10-03 MED ORDER — BREZTRI AEROSPHERE 160-9-4.8 MCG/ACT IN AERO
2.0000 | INHALATION_SPRAY | Freq: Two times a day (BID) | RESPIRATORY_TRACT | 11 refills | Status: DC
  Filled 2023-10-03: qty 10.7, 30d supply, fill #0

## 2023-10-03 NOTE — TOC Transition Note (Signed)
 Transition of Care [facility]) - Discharge Note   Patient Details  Name: Vincent Griffin MRN: [id number] Date of Birth: Feb 14, 1961  [facility] CM/SW Contact:  Delilah Fend, LCSW Phone Number: 10/03/2023, 1:34 PM   Clinical Narrative:     Alerted by MD that pt will require home O2 and orders placed.  Pt aware and agreeable and no DME agency preference.  Order placed with [employer] who will deliver portable tank to room prior to dc today with concentrator to the home.  No further TOC needs.   Final next level of care: Home/Self Care Barriers to Discharge: Barriers Resolved   Patient Goals and CMS Choice Patient states their goals for this hospitalization and ongoing recovery are:: return home          Discharge Placement                       Discharge Plan and Services Additional resources added to the After Visit Summary for                  DME Arranged: Oxygen DME Agency: [employer] Date DME Agency Contacted: 10/03/23 Time DME Agency Contacted: [phone] Representative spoke with at DME Agency: Gladys Lamp            Social Drivers of Health (SDOH) Interventions SDOH Screenings   Food Insecurity: No Food Insecurity (10/03/2023)  Housing: Unknown (10/03/2023)  Transportation Needs: No Transportation Needs (10/03/2023)  Utilities: Not At Risk (10/03/2023)  Depression (PHQ2-9): Low Risk  (09/13/2023)  Recent Concern: Depression (PHQ2-9) - Medium Risk (08/04/2023)  Financial Resource Strain: Low Risk  (08/12/2020)   Received from [facility], [facility]  Social Connections: Unknown (09/20/2021)   Received from [facility], [facility]  Stress: No Stress Concern Present (08/12/2020)   Received from [facility], [facility]  Tobacco Use: High Risk (09/30/2023)     Readmission Risk Interventions    10/02/2023    9:07 AM  Readmission Risk Prevention Plan  Transportation Screening Complete  PCP or Specialist Appt within 3-5 Days Complete  HRI or Home  Care Consult Not Complete  HRI or Home Care Consult comments N/A  Social Work Consult for Recovery Care Planning/Counseling Not Complete  SW consult not completed comments N/A  Palliative Care Screening Not Applicable  Medication Review Oceanographer) Complete

## 2023-10-03 NOTE — Discharge Summary (Signed)
 Physician Discharge Summary  Vincent Griffin:[id number] DOB: 1961/02/12 DOA: 09/30/2023  PCP: Yevette Hem, FNP  Admit date: 09/30/2023 Discharge date: 10/03/2023  Admitted From: Home Disposition: Home  Recommendations for Outpatient Follow-up:  Follow up with PCP in 1-2 weeks Follow-up with urology as scheduled  Home Health: None Equipment/Devices: Oxygen, 2 L with exertion  Discharge Condition: Stable CODE STATUS: Full Diet recommendation: Low-salt low-fat low-carb diet  Brief/Interim Summary: 63 y.o. 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 (status post Coil embolization right internal iliac artery (10 mm x 20 cm interlock and 8 mm x 20 cm interlock coils, Repair of right common iliac aneurysm with aortobiiliac stent graft, Placement of extension endovascular limb on the right on 09/08/2023), history of recurrent nephrolithiasis, history of cecal (colon) cancer s/p robotic assisted laparoscopic right hemicolectomy on 10/07/2022 , morbid obesity admitted on 09/30/2023 after presenting with left flank pain, dysuria, fever and leukocytosis and found to have sepsis secondary to gram-negative rod bacteremia and UTI with questionably infected nephrolithiasis.  Patient transferred to [facility] for urgent urological intervention.  Hospitalist called for admission.  Patient admitted as above with acute obstructed infectious nephrolithiasis with septic shock requiring urgent evaluation and treatment with urology, status post pyelogram and ureteral stent and IV antibiotics patient recovered quite rapidly and is now back to baseline without any further signs or symptoms of hypotension and recovering from the infection.  Based on cultures per discussion with pharmacy we will transition patient home on cefadroxil for the remainder of his course, 11 days.  Patient otherwise stable and  agreeable for discharge home at this time.  Close outpatient follow-up with PCP and urology as scheduled.  During hospitalization patient required IV fluids and supportive care, patient remained hypoxic despite appearing euvolemic without rales, concern this is his new baseline given history of COPD with ongoing tobacco use and abuse.   5/18 - Patient tolerated urological procedure on 10/01/2023 including cystoscopy, retrograde pyelogram and ureteral stent on the left.   5/19 - Septic shock resolved -urine output appropriate 5/20-patient continues to improve, approaching baseline, remains hypoxic in the setting of advanced COPD with ongoing tobacco use and abuse   Discharge Diagnoses:  Principal Problem:   Severe GNR Sepsis secondary to UTI [facility]) Active Problems:   Lactic acidosis   Nephrolithiasis-8 mm stone in proximal left ureter with obstructive change.   Acute respiratory failure with hypoxia (HCC)   AKI (acute kidney injury) (HCC)   Tobacco abuse   COPD (chronic obstructive pulmonary disease) (HCC)   CAD (coronary artery disease)   Aneurysm artery, iliac (HCC)   Iliac artery aneurysm (HCC)   Elevated MCV   Obesity, Class III, BMI 40-49.9 (morbid obesity)   Severe sepsis (HCC)  Septic shock, resolved Lactic acidosis, resolved Gram negative bacteremia/UTI(enterobacter/proteus), POA Obstructive 8mm left nephrolithiasis - Leukocytosis, tachycardia, source as above - required multiple doses of epinephrine  in the OR to maintain MAP greater than 65 -patient remains somewhat hypotensive postoperatively improving with IV fluids and p.o. midodrine   - Wean off midodrine , remains normotensive - Blood cultures remarkable for gram-negative bacteremia, presumed Enterobacter/Proteus per BC ID, urine culture also showing Proteus-transition to cefadroxil per sensitivities   Recurrent nephrolithiasis - Presumed infected as above - Left ureteral stent placed - Imaging confirms 8 mm stone, notable  obstruction on imaging   COPD/tobacco abuse--now with acute on likely chronic hypoxic respiratory failure -  Prior baseline room air, given patient's ongoing tobacco use and abuse concern patient's new baseline is oxygen requirement with ambulation.  Will start with 2 L nasal cannula discharge, outpatient follow-up in the next 2 to 3 weeks to ensure   AKI without history of CKD, multifactorial Secondary to obstructive uropathy, hypoperfusion in the setting of shock, sepsis - Secondary to above, improving drastically with IV fluids - Baseline 1.0, peak 2.1  - downtrending currently 1.5   PAD----Prior endovascular stent graft therapy of the abdominal aorta - Recent evaluation with Dr. Fulton Job on 09/08/2023 - Continue aspirin  and atorvastatin    Hypomagnesemia/hyponatremia-replace as appropriate, improving with p.o. intake   CAD--history of prior MI -Without signs or symptoms of ACS - Continue aspirin , statin - Hold metoprolol  due to hypotension - Echo: EF 65-70% without wall motion abnormalities, grade 1 diastolic dysfunction   )Morbid Obesity- Body mass index is 45.61 kg/m.    Discharge Instructions  Discharge Instructions     Call MD for:  difficulty breathing, headache or visual disturbances   Complete by: As directed    Call MD for:  extreme fatigue   Complete by: As directed    Call MD for:  hives   Complete by: As directed    Call MD for:  persistant dizziness or Cleaver-headedness   Complete by: As directed    Call MD for:  persistant nausea and vomiting   Complete by: As directed    Call MD for:  severe uncontrolled pain   Complete by: As directed    Call MD for:  temperature >100.4   Complete by: As directed    Diet - low sodium heart healthy   Complete by: As directed    Increase activity slowly   Complete by: As directed       Allergies as of 10/03/2023       Reactions   Zetia [ezetimibe] Diarrhea, Nausea Only   Upset stomach        Medication List      STOP taking these medications    oxyCODONE  5 MG immediate release tablet Commonly known as: Oxy IR/ROXICODONE        TAKE these medications    albuterol  108 (90 Base) MCG/ACT inhaler Commonly known as: VENTOLIN  HFA Inhale 2 puffs into the lungs every 6 (six) hours as needed for wheezing or shortness of breath.   aspirin  81 MG chewable tablet Chew 81 mg by mouth in the morning.   atorvastatin  80 MG tablet Commonly known as: LIPITOR Take 80 mg by mouth at bedtime.   Breztri  Aerosphere 160-9-4.8 MCG/ACT Aero inhaler Generic drug: budesonide -glycopyrrolate -formoterol  Inhale 2 puffs into the lungs 2 (two) times daily.   cefadroxil 500 MG capsule Commonly known as: DURICEF Take 2 capsules (1,000 mg total) by mouth 2 (two) times daily for 11 days.   IRON PO Take 1-2 tablets by mouth as needed.   metoprolol  succinate 25 MG 24 hr tablet Commonly known as: TOPROL -XL Take 25 mg by mouth in the morning.   nitroGLYCERIN  0.4 MG SL tablet Commonly known as: NITROSTAT  Place 0.4 mg under the tongue every 5 (five) minutes as needed for chest pain.   oxyCODONE -acetaminophen  5-325 MG tablet Commonly known as: Percocet Take 1 tablet by mouth every 6 (six) hours as needed for severe pain (pain score 7-10).               Durable Medical Equipment  (From admission, onward)           Start  Ordered   10/03/23 1302  For home use only DME oxygen  Once       Question Answer Comment  Length of Need Lifetime   Mode or (Route) Nasal cannula   Liters per Minute 2   Frequency Continuous (stationary and portable oxygen unit needed)   Oxygen delivery system Gas      10/03/23 1302            Allergies  Allergen Reactions   Zetia [Ezetimibe] Diarrhea and Nausea Only    Upset stomach    Consultations: Urology  Procedures/Studies: DG C-Arm 1-60 Min-No Report Result Date: 10/01/2023 Fluoroscopy was utilized by the requesting physician.  No radiographic  interpretation.   ECHOCARDIOGRAM COMPLETE Result Date: 10/01/2023    ECHOCARDIOGRAM REPORT   Patient Name:   Divante A Luckow Date of Exam: 10/01/2023 Medical Rec #:  [id number]    Height:       70.0 in Accession #:    [id number]   Weight:       310.0 lb Date of Birth:  [birth year]    BSA:          2.515 m Patient Age:    62 years     BP:           87/60 mmHg Patient Gender: M            HR:           94 bpm. Exam Location:  [facility] Procedure: 2D Echo, Cardiac Doppler, Color Doppler and Intracardiac            Opacification Agent (Both Spectral and Color Flow Doppler were            utilized during procedure). Indications:    CHF-Acute Diastolic  History:        Patient has no prior history of Echocardiogram examinations. CAD                 and Previous Myocardial Infarction, COPD; Risk Factors:Current                 Smoker and Hypertension.  Sonographer:    Willey Harrier Referring Phys: [id number] OLADAPO ADEFESO  Sonographer Comments: Technically difficult study due to poor echo windows and patient is obese. Image acquisition challenging due to patient body habitus and Image acquisition challenging due to respiratory motion. IMPRESSIONS  1. Left ventricular ejection fraction, by estimation, is 65 to 70%. The left ventricle has normal function. The left ventricle has no regional wall motion abnormalities. Left ventricular diastolic parameters are consistent with Grade I diastolic dysfunction (impaired relaxation).  2. Right ventricular systolic function is normal. The right ventricular size is normal.  3. The mitral valve is normal in structure. No evidence of mitral valve regurgitation. No evidence of mitral stenosis.  4. The aortic valve was not well visualized. Aortic valve regurgitation is not visualized. No aortic stenosis is present.  5. The inferior vena cava is normal in size with greater than 50% respiratory variability, suggesting right atrial pressure of 3 mmHg. Conclusion(s)/Recommendation(s): Technically  limited study due to poor sound wave transmission. FINDINGS  Left Ventricle: Left ventricular ejection fraction, by estimation, is 65 to 70%. The left ventricle has normal function. The left ventricle has no regional wall motion abnormalities. Definity  contrast agent was given IV to delineate the left ventricular  endocardial borders. The left ventricular internal cavity size was normal in size. There is no left ventricular hypertrophy. Left ventricular diastolic parameters are consistent  with Grade I diastolic dysfunction (impaired relaxation). Right Ventricle: The right ventricular size is normal. No increase in right ventricular wall thickness. Right ventricular systolic function is normal. Left Atrium: Left atrial size was normal in size. Right Atrium: Right atrial size was normal in size. Pericardium: There is no evidence of pericardial effusion. Mitral Valve: The mitral valve is normal in structure. No evidence of mitral valve regurgitation. No evidence of mitral valve stenosis. MV peak gradient, 3.2 mmHg. The mean mitral valve gradient is 2.0 mmHg. Tricuspid Valve: The tricuspid valve is not well visualized. Tricuspid valve regurgitation is trivial. No evidence of tricuspid stenosis. Aortic Valve: The aortic valve was not well visualized. Aortic valve regurgitation is not visualized. No aortic stenosis is present. Aortic valve peak gradient measures 10.6 mmHg. Pulmonic Valve: The pulmonic valve was normal in structure. Pulmonic valve regurgitation is not visualized. No evidence of pulmonic stenosis. Aorta: The aortic root is normal in size and structure. Venous: The inferior vena cava is normal in size with greater than 50% respiratory variability, suggesting right atrial pressure of 3 mmHg. IAS/Shunts: No atrial level shunt detected by color flow Doppler.  LEFT VENTRICLE PLAX 2D LVIDd:         5.30 cm   Diastology LVIDs:         4.10 cm   LV e' medial:    9.14 cm/s LV PW:         0.90 cm   LV E/e' medial:   7.0 LV IVS:        0.80 cm   LV e' lateral:   12.30 cm/s LVOT diam:     2.20 cm   LV E/e' lateral: 5.2 LV SV:         79 LV SV Index:   31 LVOT Area:     3.80 cm  RIGHT VENTRICLE             IVC RV S prime:     11.70 cm/s  IVC diam: 2.00 cm TAPSE (M-mode): 2.8 cm LEFT ATRIUM             Index        RIGHT ATRIUM           Index LA Vol (A2C):   54.6 ml 21.71 ml/m  RA Area:     21.20 cm LA Vol (A4C):   39.5 ml 15.71 ml/m  RA Volume:   61.00 ml  24.26 ml/m LA Biplane Vol: 48.8 ml 19.41 ml/m  AORTIC VALVE AV Area (Vmax): 2.45 cm AV Vmax:        163.00 cm/s AV Peak Grad:   10.6 mmHg LVOT Vmax:      105.00 cm/s LVOT Vmean:     77.800 cm/s LVOT VTI:       0.208 m  AORTA Ao Root diam: 3.60 cm Ao Asc diam:  3.80 cm MITRAL VALVE MV Area (PHT): 3.53 cm    SHUNTS MV Area VTI:   4.20 cm    Systemic VTI:  0.21 m MV Peak grad:  3.2 mmHg    Systemic Diam: 2.20 cm MV Mean grad:  2.0 mmHg MV Vmax:       0.89 m/s MV Vmean:      61.4 cm/s MV Decel Time: 215 msec MV E velocity: 64.30 cm/s MV A velocity: 82.70 cm/s MV E/A ratio:  0.78 Jules Oar MD Electronically signed by Jules Oar MD Signature Date/Time: 10/01/2023/3:21:42 PM    Final    DG Chest  Port 1 View Result Date: 09/30/2023 CLINICAL DATA:  Possible sepsis EXAM: PORTABLE CHEST 1 VIEW COMPARISON:  09/08/2023 FINDINGS: Cardiac shadow is enlarged. Mild aortic calcifications are seen. Persistent interstitial opacities are noted stable from the prior exam. No focal confluent infiltrate is seen. No bony abnormality is noted. IMPRESSION: Stable appearance when compared with the prior exam. Electronically Signed   By: Violeta Grey M.D.   On: 09/30/2023 19:46   CT Renal Stone Study Result Date: 09/30/2023 CLINICAL DATA:  Left flank pain EXAM: CT ABDOMEN AND PELVIS WITHOUT CONTRAST TECHNIQUE: Multidetector CT imaging of the abdomen and pelvis was performed following the standard protocol without IV contrast. RADIATION DOSE REDUCTION: This exam was performed  according to the departmental dose-optimization program which includes automated exposure control, adjustment of the mA and/or kV according to patient size and/or use of iterative reconstruction technique. COMPARISON:  09/27/2023 FINDINGS: Lower chest: Lung bases are free of acute infiltrate or sizable effusion. Hepatobiliary: No focal liver abnormality is seen. No gallstones, gallbladder wall thickening, or biliary dilatation. Pancreas: Unremarkable. No pancreatic ductal dilatation or surrounding inflammatory changes. Spleen: Normal in size without focal abnormality. Adrenals/Urinary Tract: Adrenal glands are within normal limits. Nonobstructing right renal calculi noted and stable. The right ureter is within normal limits. Left kidney demonstrates significant perinephric stranding with migration of 1 of the upper pole stones into the proximal left ureter. This calculus measures up to 8 mm. Small punctate nonobstructing stones remain in the left collecting system. The distal left ureter is within normal limits. Bladder is decompressed. Stomach/Bowel: Diverticulosis without diverticulitis is noted. Postsurgical changes in the right colon are seen. No obstructive changes are noted. The appendix has been surgically. Small bowel and stomach are unremarkable. Vascular/Lymphatic: Atherosclerotic calcifications of the aorta are noted. Aortic stent graft is seen and stable from the prior exam. No lymphadenopathy is noted. Reproductive: Prostate is unremarkable. Other: No abdominal wall hernia or abnormality. No abdominopelvic ascites. Musculoskeletal: No acute or significant osseous findings. IMPRESSION: Interval migration of an 8 mm stone from the left kidney into the proximal left ureter with obstructive change. Bilateral nonobstructing renal calculi stable from the prior exam. Diverticulosis without diverticulitis. Prior endovascular stent graft therapy of the abdominal aorta. No aneurysmal dilatation is seen. Stable  aneurysmal dilatation of the right common iliac artery is noted. Electronically Signed   By: Violeta Grey M.D.   On: 09/30/2023 19:45   CT ANGIO ABDOMEN PELVIS  W & WO CONTRAST Result Date: 09/27/2023 CLINICAL DATA:  Iliac artery aneurysm EXAM: CTA ABDOMEN AND PELVIS WITHOUT AND WITH CONTRAST TECHNIQUE: Multidetector CT imaging of the abdomen and pelvis was performed using the standard protocol during bolus administration of intravenous contrast. Multiplanar reconstructed images and MIPs were obtained and reviewed to evaluate the vascular anatomy. RADIATION DOSE REDUCTION: This exam was performed according to the departmental dose-optimization program which includes automated exposure control, adjustment of the mA and/or kV according to patient size and/or use of iterative reconstruction technique. CONTRAST:  OMNIPAQUE  IOHEXOL  350 MG/ML SOLN COMPARISON:  CT of the chest abdomen pelvis performed June 01, 2023 FINDINGS: VASCULAR Aorta: Patent. Stent material is present within the infrarenal segment which extends into the iliac arteries. The stented segment is patent. No abdominal aortic aneurysm. Celiac: The left gastric artery originates directly from the aorta. Celiac artery is patent. SMA: Mild atherosclerotic changes in the proximal segment, patent. Renals: Patent. IMA: Not clearly opacified in the proximal segment. The mid and distal segments opacify with contrast agent. Inflow:  Stent material extends from the infrarenal abdominal aorta into the right external iliac artery. This is patent. Coil embolization of the right internal iliac artery proximally. Right common iliac artery aneurysm measures 3.5 cm, not significantly changed. The right external iliac artery is patent. Stent material extends into the left common iliac artery, patent. The left internal iliac and left external iliac arteries are patent. Proximal Outflow: Patent. Veins: No obvious venous abnormality within the limitations of this  arterial phase study. Review of the MIP images confirms the above findings. NON-VASCULAR Lower chest: No acute abnormality. Hepatobiliary: No focal liver abnormality is seen. No gallstones, gallbladder wall thickening, or biliary dilatation. Pancreas: Unremarkable. No pancreatic ductal dilatation or surrounding inflammatory changes. Spleen: Normal in size without focal abnormality. Adrenals/Urinary Tract: Nonobstructing calculi are present in the left kidney with the largest measuring 6 mm. No hydronephrosis. Urinary bladder is decompressed. Adrenal glands are unchanged. Stomach/Bowel: No dilated loops of bowel are appreciated. An enteric anastomosis is present in the right abdomen. Diverticular changes are present in the distal colon. Lymphatic: No significant vascular findings are present. No enlarged abdominal or pelvic lymph nodes. Reproductive: Prostate is unremarkable. Other: No abdominal wall hernia or abnormality. No abdominopelvic ascites. Musculoskeletal: Diffuse osteopenia. Degenerative changes in the imaged osseous structures. IMPRESSION: 1. Post treatment changes from endovascular treatment of right common iliac artery aneurysm. Stent grafts are patent. Right common iliac artery aneurysm is excluded and stable in size. Electronically Signed   By: Reagan Camera M.D.   On: 09/27/2023 13:39   DG Chest Port 1 View Result Date: 09/08/2023 CLINICAL DATA:  Status post iliac artery stent insertion EXAM: PORTABLE CHEST 1 VIEW COMPARISON:  Chest radiograph dated 10/05/2022 FINDINGS: Patient is rotated mildly to the right. Normal lung volumes. Diffuse reticular opacities. No pleural effusion or pneumothorax. The heart size and mediastinal contours are within normal limits. No acute osseous abnormality. 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 No pneumothorax. Electronically Signed   By: Limin  Xu M.D.   On: 09/08/2023 14:55   HYBRID OR IMAGING  ([facility] ONLY) Result Date: 09/08/2023 There is no interpretation for this exam.  This order is for images obtained during a surgical procedure.  Please See "Surgeries" Tab for more information regarding the procedure.     Subjective: No acute issues or events overnight denies nausea vomiting diarrhea constipation any fever chills or chest pain   Discharge Exam: Vitals:   10/03/23 0948 10/03/23 1403  BP:  114/66  Pulse:  81  Resp:    Temp:    SpO2: (!) 82% 93%   Vitals:   10/02/23 2329 10/03/23 0517 10/03/23 0948 10/03/23 1403  BP: 116/74 120/76  114/66  Pulse: 81 79  81  Resp: 17 17    Temp: 98.2 F (36.8 C) 97.9 F (36.6 C)    TempSrc: Oral Oral    SpO2: 90% 90% (!) 82% 93%  Weight:      Height:        General: Pt is alert, awake, not in acute distress Cardiovascular: RRR, S1/S2 +, no rubs, no gallops Respiratory: CTA bilaterally, no wheezing, no rhonchi Abdominal: Soft, NT, ND, bowel sounds + Extremities: no edema, no cyanosis    The results of significant diagnostics from this hospitalization (including imaging, microbiology, ancillary and laboratory) are listed below for reference.     Microbiology: Recent Results (from the past 240 hours)  Blood Culture (routine x 2)     Status:  Abnormal   Collection Time: 09/30/23  6:03 PM   Specimen: Right Antecubital; Blood  Result Value Ref Range Status   Specimen Description   Final    RIGHT ANTECUBITAL Performed at [facility], [address], [place], [place] [address]    Special Requests   Final    BOTTLES DRAWN AEROBIC AND ANAEROBIC Blood Culture adequate volume Performed at [facility], [address], [place], [place] [address]    Culture  Setup Time   Final    GRAM NEGATIVE RODS ANAEROBIC BOTTLE ONLY Gram Stain Report Called to,Read Back By and Verified With: Burrell Casa AT 0808 ON [id number] BY S [place] CRITICAL RESULT CALLED TO, READ BACK BY AND VERIFIED WITH: PHARMD MICHELLE BELL [id number] AT 1356 BY  EC Performed at [facility], [address], [place], [place] [address]    Culture PROTEUS MIRABILIS (A)  Final   Report Status 10/03/2023 FINAL  Final   Organism ID, Bacteria PROTEUS MIRABILIS  Final   Organism ID, Bacteria PROTEUS MIRABILIS  Final      Susceptibility   Proteus mirabilis - KIRBY BAUER*    CEFAZOLIN  SENSITIVE Sensitive    Proteus mirabilis - MIC*    AMPICILLIN <=2 SENSITIVE Sensitive     CEFEPIME  <=0.12 SENSITIVE Sensitive     CEFTAZIDIME <=1 SENSITIVE Sensitive     CEFTRIAXONE  <=0.25 SENSITIVE Sensitive     CIPROFLOXACIN <=0.25 SENSITIVE Sensitive     GENTAMICIN <=1 SENSITIVE Sensitive     IMIPENEM 2 SENSITIVE Sensitive     TRIMETH /SULFA  <=20 SENSITIVE Sensitive     AMPICILLIN/SULBACTAM <=2 SENSITIVE Sensitive     PIP/TAZO <=4 SENSITIVE Sensitive ug/mL    * PROTEUS MIRABILIS    PROTEUS MIRABILIS  Blood Culture ID Panel (Reflexed)     Status: Abnormal   Collection Time: 09/30/23  6:03 PM  Result Value Ref Range Status   Enterococcus faecalis NOT DETECTED NOT DETECTED Final   Enterococcus Faecium NOT DETECTED NOT DETECTED Final   Listeria monocytogenes NOT DETECTED NOT DETECTED Final   Staphylococcus species NOT DETECTED NOT DETECTED Final   Staphylococcus aureus (BCID) NOT DETECTED NOT DETECTED Final   Staphylococcus epidermidis NOT DETECTED NOT DETECTED Final   Staphylococcus lugdunensis NOT DETECTED NOT DETECTED Final   Streptococcus species NOT DETECTED NOT DETECTED Final   Streptococcus agalactiae NOT DETECTED NOT DETECTED Final   Streptococcus pneumoniae NOT DETECTED NOT DETECTED Final   Streptococcus pyogenes NOT DETECTED NOT DETECTED Final   A.calcoaceticus-baumannii NOT DETECTED NOT DETECTED Final   Bacteroides fragilis NOT DETECTED NOT DETECTED Final   Enterobacterales DETECTED (A) NOT DETECTED Final    Comment: Enterobacterales represent a large order of gram negative bacteria, not a single organism. CRITICAL RESULT CALLED TO, READ BACK BY  AND VERIFIED WITH: PHARMD MICHELLE BELL [id number] AT 1356 BY EC    Enterobacter cloacae complex NOT DETECTED NOT DETECTED Final   Escherichia coli NOT DETECTED NOT DETECTED Final   Klebsiella aerogenes NOT DETECTED NOT DETECTED Final   Klebsiella oxytoca NOT DETECTED NOT DETECTED Final   Klebsiella pneumoniae NOT DETECTED NOT DETECTED Final   Proteus species DETECTED (A) NOT DETECTED Final    Comment: CRITICAL RESULT CALLED TO, READ BACK BY AND VERIFIED WITH: PHARMD MICHELLE BELL [id number] AY 1356 BY EC    Salmonella species NOT DETECTED NOT DETECTED Final   Serratia marcescens NOT DETECTED NOT DETECTED Final   Haemophilus influenzae NOT DETECTED NOT DETECTED Final   Neisseria meningitidis NOT DETECTED NOT DETECTED Final  Pseudomonas aeruginosa NOT DETECTED NOT DETECTED Final   Stenotrophomonas maltophilia NOT DETECTED NOT DETECTED Final   Candida albicans NOT DETECTED NOT DETECTED Final   Candida auris NOT DETECTED NOT DETECTED Final   Candida glabrata NOT DETECTED NOT DETECTED Final   Candida krusei NOT DETECTED NOT DETECTED Final   Candida parapsilosis NOT DETECTED NOT DETECTED Final   Candida tropicalis NOT DETECTED NOT DETECTED Final   Cryptococcus neoformans/gattii NOT DETECTED NOT DETECTED Final   CTX-M ESBL NOT DETECTED NOT DETECTED Final   Carbapenem resistance IMP NOT DETECTED NOT DETECTED Final   Carbapenem resistance KPC NOT DETECTED NOT DETECTED Final   Carbapenem resistance NDM NOT DETECTED NOT DETECTED Final   Carbapenem resist OXA 48 LIKE NOT DETECTED NOT DETECTED Final   Carbapenem resistance VIM NOT DETECTED NOT DETECTED Final    Comment: Performed at [facility], [address], [place], [place] [address]  Blood Culture (routine x 2)     Status: Abnormal   Collection Time: 09/30/23  6:05 PM   Specimen: Right Antecubital; Blood  Result Value Ref Range Status   Specimen Description   Final    RIGHT ANTECUBITAL Performed at [facility], [address], [place], [place] [address]    Special Requests   Final    BOTTLES DRAWN AEROBIC AND ANAEROBIC Blood Culture adequate volume Performed at [facility], [address] [place], [place] [address]    Culture  Setup Time   Final    GRAM NEGATIVE RODS ANAEROBIC BOTTLE ONLY Gram Stain Report Called to,Read Back By and Verified With: Burrell Casa AT 0808 ON [id number] BY S DALTON CRITICAL VALUE NOTED.  VALUE IS CONSISTENT WITH PREVIOUSLY REPORTED AND CALLED VALUE.    Culture (A)  Final    PROTEUS MIRABILIS SUSCEPTIBILITIES PERFORMED ON PREVIOUS CULTURE WITHIN THE LAST 5 DAYS. Performed at [facility], [address] [place], [place] [address]    Report Status 10/03/2023 FINAL  Final  Urine Culture     Status: Abnormal   Collection Time: 09/30/23  7:07 PM   Specimen: Urine, Clean Catch  Result Value Ref Range Status   Specimen Description   Final    URINE, CLEAN CATCH Performed at [facility], [address], [place], [place] [address]    Special Requests   Final    NONE Performed at [facility], [address], [place], [place] [address]    Culture >=100,000 COLONIES/mL PROTEUS MIRABILIS (A)  Final   Report Status 10/03/2023 FINAL  Final   Organism ID, Bacteria PROTEUS MIRABILIS (A)  Final      Susceptibility   Proteus mirabilis - MIC*    AMPICILLIN <=2 SENSITIVE Sensitive     CEFAZOLIN  <=4 SENSITIVE Sensitive     CEFEPIME  <=0.12 SENSITIVE Sensitive     CEFTRIAXONE  <=0.25 SENSITIVE Sensitive     CIPROFLOXACIN <=0.25 SENSITIVE Sensitive     GENTAMICIN <=1 SENSITIVE Sensitive     IMIPENEM 1 SENSITIVE Sensitive     NITROFURANTOIN 128 RESISTANT Resistant     TRIMETH /SULFA  <=20 SENSITIVE Sensitive     AMPICILLIN/SULBACTAM <=2 SENSITIVE Sensitive     PIP/TAZO <=4 SENSITIVE Sensitive ug/mL    * >=100,000 COLONIES/mL PROTEUS MIRABILIS  MRSA Next Gen by PCR, Nasal     Status: None   Collection Time: 10/01/23  2:11 PM   Specimen: Nasal Mucosa; Nasal Swab  Result Value Ref Range  Status   MRSA by PCR Next Gen NOT DETECTED NOT DETECTED Final  Comment: (NOTE) The GeneXpert MRSA Assay (FDA approved for NASAL specimens only), is one component of a comprehensive MRSA colonization surveillance program. It is not intended to diagnose MRSA infection nor to guide or monitor treatment for MRSA infections. Test performance is not FDA approved in patients less than 56 years old. Performed at [facility], [address] [place], [place] [address]      Labs: BNP (last 3 results) Recent Labs    10/01/23 0559  BNP 258.0*   Basic Metabolic Panel: Recent Labs  Lab 09/30/23 1745 09/30/23 1803 10/01/23 0559 10/02/23 0239 10/03/23 0502  NA 137 137 133* 134* 138  K 4.2 4.0 4.2 5.1 4.4  CL 101 98 101 101 103  CO2 27 25 22 24 25   GLUCOSE 117* 123* 112* 133* 102*  BUN 14 14 19  24* 27*  CREATININE 1.41* 1.40* 2.08* 1.56* 1.11  CALCIUM  8.8* 8.6* 7.9* 7.8* 8.5*  MG  --   --  1.5*  --   --   PHOS  --   --  3.1  --   --    Liver Function Tests: Recent Labs  Lab 09/30/23 1803 10/01/23 0559 10/02/23 0239  AST 39 46* 56*  ALT 38 33 34  ALKPHOS 114 77 58  BILITOT 0.8 0.8 0.6  PROT 7.8 6.6 5.8*  ALBUMIN  3.4* 2.8* 2.3*   No results for input(s): "LIPASE", "AMYLASE" in the last 168 hours. No results for input(s): "AMMONIA" in the last 168 hours. CBC: Recent Labs  Lab 09/30/23 1745 10/01/23 0559 10/02/23 0239 10/03/23 0502  WBC 11.3* 12.7* 13.6* 12.4*  HGB 14.8 13.3 11.6* 12.4*  HCT 46.3 41.9 38.1* 40.3  MCV 103.8* 105.0* 107.0* 106.1*  PLT 271 158 127* 131*   Cardiac Enzymes: No results for input(s): "CKTOTAL", "CKMB", "CKMBINDEX", "TROPONINI" in the last 168 hours. BNP: Invalid input(s): "POCBNP" CBG: No results for input(s): "GLUCAP" in the last 168 hours. D-Dimer No results for input(s): "DDIMER" in the last 72 hours. Hgb A1c No results for input(s): "HGBA1C" in the last 72 hours. Lipid Profile No results for input(s):  "CHOL", "HDL", "LDLCALC", "TRIG", "CHOLHDL", "LDLDIRECT" in the last 72 hours. Thyroid function studies No results for input(s): "TSH", "T4TOTAL", "T3FREE", "THYROIDAB" in the last 72 hours.  Invalid input(s): "FREET3" Anemia work up Recent Labs    10/01/23 0559  VITAMINB12 326  FOLATE 7.0   Urinalysis    Component Value Date/Time   COLORURINE YELLOW 09/30/2023 1907   APPEARANCEUR HAZY (A) 09/30/2023 1907   APPEARANCEUR Clear 09/13/2023 0819   LABSPEC 1.016 09/30/2023 1907   PHURINE 6.0 09/30/2023 1907   GLUCOSEU NEGATIVE 09/30/2023 1907   HGBUR MODERATE (A) 09/30/2023 1907   BILIRUBINUR NEGATIVE 09/30/2023 1907   BILIRUBINUR Negative 09/13/2023 0819   KETONESUR NEGATIVE 09/30/2023 1907   PROTEINUR 30 (A) 09/30/2023 1907   NITRITE POSITIVE (A) 09/30/2023 1907   LEUKOCYTESUR MODERATE (A) 09/30/2023 1907   Sepsis Labs Recent Labs  Lab 09/30/23 1745 10/01/23 0559 10/02/23 0239 10/03/23 0502  WBC 11.3* 12.7* 13.6* 12.4*   Microbiology Recent Results (from the past 240 hours)  Blood Culture (routine x 2)     Status: Abnormal   Collection Time: 09/30/23  6:03 PM   Specimen: Right Antecubital; Blood  Result Value Ref Range Status   Specimen Description   Final    RIGHT ANTECUBITAL Performed at [facility], [address], [place], [place] [address]    Special Requests   Final  BOTTLES DRAWN AEROBIC AND ANAEROBIC Blood Culture adequate volume Performed at [facility], [address], [place], [place] [address]    Culture  Setup Time   Final    GRAM NEGATIVE RODS ANAEROBIC BOTTLE ONLY Gram Stain Report Called to,Read Back By and Verified With: Burrell Casa AT 0808 ON [id number] BY S D[place] CRITICAL RESULT CALLED TO, READ BACK BY AND VERIFIED WITH: PHARMD MICHELLE BELL [id number] AT 1356 BY EC Performed at [facility], [address] [place], [place] [address]    Culture PROTEUS MIRABILIS (A)  Final   Report Status 10/03/2023 FINAL  Final   Organism ID,  Bacteria PROTEUS MIRABILIS  Final   Organism ID, Bacteria PROTEUS MIRABILIS  Final      Susceptibility   Proteus mirabilis - KIRBY BAUER*    CEFAZOLIN  SENSITIVE Sensitive    Proteus mirabilis - MIC*    AMPICILLIN <=2 SENSITIVE Sensitive     CEFEPIME  <=0.12 SENSITIVE Sensitive     CEFTAZIDIME <=1 SENSITIVE Sensitive     CEFTRIAXONE  <=0.25 SENSITIVE Sensitive     CIPROFLOXACIN <=0.25 SENSITIVE Sensitive     GENTAMICIN <=1 SENSITIVE Sensitive     IMIPENEM 2 SENSITIVE Sensitive     TRIMETH /SULFA  <=20 SENSITIVE Sensitive     AMPICILLIN/SULBACTAM <=2 SENSITIVE Sensitive     PIP/TAZO <=4 SENSITIVE Sensitive ug/mL    * PROTEUS MIRABILIS    PROTEUS MIRABILIS  Blood Culture ID Panel (Reflexed)     Status: Abnormal   Collection Time: 09/30/23  6:03 PM  Result Value Ref Range Status   Enterococcus faecalis NOT DETECTED NOT DETECTED Final   Enterococcus Faecium NOT DETECTED NOT DETECTED Final   Listeria monocytogenes NOT DETECTED NOT DETECTED Final   Staphylococcus species NOT DETECTED NOT DETECTED Final   Staphylococcus aureus (BCID) NOT DETECTED NOT DETECTED Final   Staphylococcus epidermidis NOT DETECTED NOT DETECTED Final   Staphylococcus lugdunensis NOT DETECTED NOT DETECTED Final   Streptococcus species NOT DETECTED NOT DETECTED Final   Streptococcus agalactiae NOT DETECTED NOT DETECTED Final   Streptococcus pneumoniae NOT DETECTED NOT DETECTED Final   Streptococcus pyogenes NOT DETECTED NOT DETECTED Final   A.calcoaceticus-baumannii NOT DETECTED NOT DETECTED Final   Bacteroides fragilis NOT DETECTED NOT DETECTED Final   Enterobacterales DETECTED (A) NOT DETECTED Final    Comment: Enterobacterales represent a large order of gram negative bacteria, not a single organism. CRITICAL RESULT CALLED TO, READ BACK BY AND VERIFIED WITH: PHARMD MICHELLE BELL [id number] AT 1356 BY EC    Enterobacter cloacae complex NOT DETECTED NOT DETECTED Final   Escherichia coli NOT DETECTED NOT DETECTED Final    Klebsiella aerogenes NOT DETECTED NOT DETECTED Final   Klebsiella oxytoca NOT DETECTED NOT DETECTED Final   Klebsiella pneumoniae NOT DETECTED NOT DETECTED Final   Proteus species DETECTED (A) NOT DETECTED Final    Comment: CRITICAL RESULT CALLED TO, READ BACK BY AND VERIFIED WITH: PHARMD MICHELLE BELL [id number] AY 1356 BY EC    Salmonella species NOT DETECTED NOT DETECTED Final   Serratia marcescens NOT DETECTED NOT DETECTED Final   Haemophilus influenzae NOT DETECTED NOT DETECTED Final   Neisseria meningitidis NOT DETECTED NOT DETECTED Final   Pseudomonas aeruginosa NOT DETECTED NOT DETECTED Final   Stenotrophomonas maltophilia NOT DETECTED NOT DETECTED Final   Candida albicans NOT DETECTED NOT DETECTED Final   Candida auris NOT DETECTED NOT DETECTED Final   Candida glabrata NOT DETECTED NOT DETECTED Final   Candida krusei NOT DETECTED NOT DETECTED Final  Candida parapsilosis NOT DETECTED NOT DETECTED Final   Candida tropicalis NOT DETECTED NOT DETECTED Final   Cryptococcus neoformans/gattii NOT DETECTED NOT DETECTED Final   CTX-M ESBL NOT DETECTED NOT DETECTED Final   Carbapenem resistance IMP NOT DETECTED NOT DETECTED Final   Carbapenem resistance KPC NOT DETECTED NOT DETECTED Final   Carbapenem resistance NDM NOT DETECTED NOT DETECTED Final   Carbapenem resist OXA 48 LIKE NOT DETECTED NOT DETECTED Final   Carbapenem resistance VIM NOT DETECTED NOT DETECTED Final    Comment: Performed at [facility], [address] [place], [place] [address]  Blood Culture (routine x 2)     Status: Abnormal   Collection Time: 09/30/23  6:05 PM   Specimen: Right Antecubital; Blood  Result Value Ref Range Status   Specimen Description   Final    RIGHT ANTECUBITAL Performed at [facility], [address] [place], [place] [address]    Special Requests   Final    BOTTLES DRAWN AEROBIC AND ANAEROBIC Blood Culture adequate volume Performed at [facility], [address],  [place], [place] [address]    Culture  Setup Time   Final    GRAM NEGATIVE RODS ANAEROBIC BOTTLE ONLY Gram Stain Report Called to,Read Back By and Verified With: Burrell Casa AT 0808 ON [id number] BY S D[place]TON CRITICAL VALUE NOTED.  VALUE IS CONSISTENT WITH PREVIOUSLY REPORTED AND CALLED VALUE.    Culture (A)  Final    PROTEUS MIRABILIS SUSCEPTIBILITIES PERFORMED ON PREVIOUS CULTURE WITHIN THE LAST 5 DAYS. Performed at [facility], [address], [place], [place] [address]    Report Status 10/03/2023 FINAL  Final  Urine Culture     Status: Abnormal   Collection Time: 09/30/23  7:07 PM   Specimen: Urine, Clean Catch  Result Value Ref Range Status   Specimen Description   Final    URINE, CLEAN CATCH Performed at [facility], [address] [place], [place] [address]    Special Requests   Final    NONE Performed at [facility], [address], [place], [place] [address]    Culture >=100,000 COLONIES/mL PROTEUS MIRABILIS (A)  Final   Report Status 10/03/2023 FINAL  Final   Organism ID, Bacteria PROTEUS MIRABILIS (A)  Final      Susceptibility   Proteus mirabilis - MIC*    AMPICILLIN <=2 SENSITIVE Sensitive     CEFAZOLIN  <=4 SENSITIVE Sensitive     CEFEPIME  <=0.12 SENSITIVE Sensitive     CEFTRIAXONE  <=0.25 SENSITIVE Sensitive     CIPROFLOXACIN <=0.25 SENSITIVE Sensitive     GENTAMICIN <=1 SENSITIVE Sensitive     IMIPENEM 1 SENSITIVE Sensitive     NITROFURANTOIN 128 RESISTANT Resistant     TRIMETH /SULFA  <=20 SENSITIVE Sensitive     AMPICILLIN/SULBACTAM <=2 SENSITIVE Sensitive     PIP/TAZO <=4 SENSITIVE Sensitive ug/mL    * >=100,000 COLONIES/mL PROTEUS MIRABILIS  MRSA Next Gen by PCR, Nasal     Status: None   Collection Time: 10/01/23  2:11 PM   Specimen: Nasal Mucosa; Nasal Swab  Result Value Ref Range Status   MRSA by PCR Next Gen NOT DETECTED NOT DETECTED Final    Comment: (NOTE) The GeneXpert MRSA Assay (FDA approved for NASAL specimens only), is one component of a  comprehensive MRSA colonization surveillance program. It is not intended to diagnose MRSA infection nor to guide or monitor treatment for MRSA infections. Test performance is not FDA approved in patients less than 12 years old.  Performed at [facility], [address] [place], [place] [address]      Time coordinating discharge: Over 30 minutes  SIGNED:   Haydee Lipa, DO [facility] 10/03/2023, 3:30 PM    If 7PM-7AM, please contact night-coverage [email or web address]

## 2023-10-03 NOTE — Progress Notes (Signed)
 Mobility Specialist - Progress Note   10/03/23 0948  Oxygen Therapy  SpO2 (!) 82 %  Patient Activity (if Appropriate) Ambulating  Mobility  Activity Ambulated independently in hallway  Level of Assistance Independent  Assistive Device None  Distance Ambulated (ft) 300 ft  Activity Response Tolerated well  Mobility Referral Yes  Mobility visit 1 Mobility  Mobility Specialist Start Time (ACUTE ONLY) [id number]  Mobility Specialist Stop Time (ACUTE ONLY) 0946  Mobility Specialist Time Calculation (min) (ACUTE ONLY) 17 min   Nurse requested Mobility Specialist to perform oxygen saturation test with pt which includes removing pt from oxygen both at rest and while ambulating.  Below are the results from that testing.     Patient Saturations on Room Air at Rest = spO2 90%  Patient Saturations on Room Air while Ambulating = sp02 82% .    Patient Saturations on 2 Liters of oxygen while Ambulating = sp02 91%  At end of testing pt left in room on 2  Liters of oxygen.  Reported results to nurse.  Pt received in bed and agreeable to mobility. No complaints during session. Pt to EOB after session with all needs met.    Pre-mobility: 90% SpO2 (RA) During mobility: 82-91% SpO2 (RA/2L [place]) Post-mobility: 91% SPO2 (2L [place])  Chief Technology Officer

## 2023-10-03 NOTE — Progress Notes (Addendum)
 SATURATION QUALIFICATIONS: (This note is used to comply with regulatory documentation for home oxygen)  Patient Saturations on Room Air at Rest = 90%  Patient Saturations on Room Air while Ambulating = 82%  Patient Saturations on 2 Liters of oxygen while Ambulating = 91%  Please briefly explain why patient needs home oxygen: Patient desats with ambulation.

## 2023-10-03 NOTE — Progress Notes (Signed)
 Discharge instructions given to patient and all questions were answered.

## 2023-10-04 ENCOUNTER — Telehealth: Payer: Self-pay | Admitting: *Deleted

## 2023-10-04 NOTE — Transitions of Care (Post Inpatient/ED Visit) (Signed)
 10/04/2023  Name: Vincent Griffin MRN: [id number] DOB: 08-22-1960  Today's TOC FU Call Status: Today's TOC FU Call Status:: Successful TOC FU Call Completed TOC FU Call Complete Date: 10/04/23 Patient's Name and Date of Birth confirmed.  Transition Care Management Follow-up Telephone Call Date of Discharge: 10/03/23 Discharge Facility: [facility]) Type of Discharge: Inpatient Admission Primary Inpatient Discharge Diagnosis:: Severe GNR Sepsis secondary to UTI How have you been since you were released from the hospital?:  (appetite getting better, good urine output, drinking adequate fluids, mostly water , ambulating without difficulty, denies pain) Any questions or concerns?: No  Items Reviewed: Did you receive and understand the discharge instructions provided?: Yes Medications obtained,verified, and reconciled?: Yes (Medications Reviewed) Any new allergies since your discharge?: No Dietary orders reviewed?: Yes Type of Diet Ordered:: low sodium,  low fat,  low carbohydrate Do you have support at home?: Yes People in Home [RPT]: spouse Name of Support/Comfort Primary Source: Armanda Bern Reviewed signs/ symptoms of infection, reportable signs/ symptoms, importance of drinking adequate fluids  Medications Reviewed Today: Medications Reviewed Today     Reviewed by Daralyn Earl, RN (Registered Nurse) on 10/04/23 at 1320  Med List Status: <None>   Medication Order Taking? Sig Documenting Provider Last Dose Status Informant  albuterol  (VENTOLIN  HFA) 108 (90 Base) MCG/ACT inhaler [id number] Yes Inhale 2 puffs into the lungs every 6 (six) hours as needed for wheezing or shortness of breath. Chrystine Crate, FNP Taking Active Self, Pharmacy Records           Med Note Rowena Copa Oct 01, 2023  2:22 PM) LF: 09/21/23 for a 25ds  aspirin  81 MG chewable tablet [id number] Yes Chew 81 mg by mouth in the morning. [provider] Taking Active Self, Pharmacy  Records  atorvastatin  (LIPITOR) 80 MG tablet [id number] Yes Take 80 mg by mouth at bedtime. [provider] Taking Active Self, Pharmacy Records           Med Note Rowena Copa Oct 01, 2023  2:23 PM) LF: 07/16/23 for a 90ds  budesonide -glycopyrrolate -formoterol  (BREZTRI  AEROSPHERE) 160-9-4.8 MCG/ACT AERO inhaler [id number] Yes Inhale 2 puffs into the lungs 2 (two) times daily. Haydee Lipa, MD Taking Active   cefadroxil (DURICEF) 500 MG capsule [id number] Yes Take 2 capsules (1,000 mg total) by mouth 2 (two) times daily for 11 days. Haydee Lipa, MD Taking Active   cyanocobalamin  (VITAMIN B12) injection 1,000 mcg [id number]   Yevette Hem, FNP  Active            Med Note Rowena Copa Oct 01, 2023  9:52 PM) Patient due for next shot around June 6th  Ferrous Sulfate (IRON PO) [id number] Yes Take 1-2 tablets by mouth as needed. [provider] Taking Active Self, Pharmacy Records  metoprolol  succinate (TOPROL -XL) 25 MG 24 hr tablet [id number] Yes Take 25 mg by mouth in the morning. [provider] Taking Active Self, Pharmacy Records           Med Note Silvestre Drum, Alvino Joseph Oct 01, 2023  2:22 PM) LF: 09/14/23 for a 30ds  nitroGLYCERIN  (NITROSTAT ) 0.4 MG SL tablet [id number] Yes Place 0.4 mg under the tongue every 5 (five) minutes as needed for chest pain. [provider] Taking Active Self, Pharmacy Records           Med Note Silvestre Drum, Alvino Joseph Oct 01, 2023  2:23 PM) LF: 04/19/23 for a 30ds  oxyCODONE -acetaminophen  (PERCOCET) 5-325 MG tablet [id number] Yes Take 1 tablet by mouth every 6 (six) hours as needed for severe pain (pain score 7-10). Rhyne, Samantha J, PA-C Taking Active Self, Pharmacy Records           Med Note Silvestre Drum, Alvino Joseph Oct 01, 2023  2:23 PM)              Home Care and Equipment/Supplies: Were Home Health Services Ordered?: No Any new equipment or medical supplies  ordered?:  (pt is not near concentrator to see name of medical supply agency) Name of Medical supply agency?: pt states he has oxygen, not sure name of company      02 at 2 liters at hs only Were you able to get the equipment/medical supplies?: Yes Do you have any questions related to the use of the equipment/supplies?: No  Functional Questionnaire: Do you need assistance with bathing/showering or dressing?: No Do you need assistance with meal preparation?: No Do you need assistance with eating?: No Do you have difficulty maintaining continence: No Do you need assistance with getting out of bed/getting out of a chair/moving?: No Do you have difficulty managing or taking your medications?: No  Follow up appointments reviewed: PCP Follow-up appointment confirmed?: Yes Date of PCP follow-up appointment?: 10/06/23 Follow-up Provider: Doctor of the day  @ 1005 am Specialist Hospital Follow-up appointment confirmed?:  (pt states he will make appointment with urology) Reason Specialist Follow-Up Not Confirmed: Patient has Specialist Provider Number and will Call for Appointment Do you need transportation to your follow-up appointment?: No Do you understand care options if your condition(s) worsen?: Yes-patient verbalized understanding  SDOH Interventions Today    Flowsheet Row Most Recent Value  SDOH Interventions   Food Insecurity Interventions Intervention Not Indicated  Housing Interventions Intervention Not Indicated  Transportation Interventions Intervention Not Indicated  Utilities Interventions Intervention Not Indicated       Cecilie Coffee [facility], BSN RN Care Manager/ Transition of Care [place] Population Health [phone]

## 2023-10-06 ENCOUNTER — Ambulatory Visit (INDEPENDENT_AMBULATORY_CARE_PROVIDER_SITE_OTHER): Admitting: Family

## 2023-10-06 ENCOUNTER — Encounter: Payer: Self-pay | Admitting: Family

## 2023-10-06 VITALS — BP 128/87 | HR 71 | Temp 97.9°F | Ht 70.0 in | Wt 306.6 lb

## 2023-10-06 DIAGNOSIS — Z720 Tobacco use: Secondary | ICD-10-CM

## 2023-10-06 DIAGNOSIS — Z09 Encounter for follow-up examination after completed treatment for conditions other than malignant neoplasm: Secondary | ICD-10-CM

## 2023-10-06 DIAGNOSIS — J439 Emphysema, unspecified: Secondary | ICD-10-CM | POA: Diagnosis not present

## 2023-10-06 DIAGNOSIS — N200 Calculus of kidney: Secondary | ICD-10-CM

## 2023-10-06 DIAGNOSIS — R6520 Severe sepsis without septic shock: Secondary | ICD-10-CM

## 2023-10-06 DIAGNOSIS — A419 Sepsis, unspecified organism: Secondary | ICD-10-CM

## 2023-10-06 DIAGNOSIS — E66813 Obesity, class 3: Secondary | ICD-10-CM | POA: Diagnosis not present

## 2023-10-06 NOTE — Addendum Note (Signed)
 Addended by: Tommas Fragmin A on: 10/06/2023 11:46 AM   Modules accepted: Level of Service

## 2023-10-06 NOTE — Patient Instructions (Signed)
Kidney Stones  Kidney stones are solid, rock-like deposits that form inside of the kidneys. The kidneys are a pair of organs that make urine. A kidney stone may form in a kidney and move into other parts of the urinary tract, including the tubes that connect the kidneys to the bladder (ureters), the bladder, and the tube that carries urine out of the body (urethra). As the stone moves through these areas, it can cause intense pain and block the flow of urine. Kidney stones are created when high levels of certain minerals are found in the urine. The stones are usually passed out of the body through urination, but in some cases, medical treatment may be needed to remove them. What are the causes? Kidney stones may be caused by: A condition in which certain glands produce too much parathyroid hormone (primary hyperparathyroidism), which causes too much calcium buildup in the blood. A buildup of uric acid crystals in the bladder (hyperuricosuria). Uric acid is a chemical that the body produces when you eat certain foods. It usually leaves the body in the urine. Narrowing (stricture) of one or both of the ureters. A kidney blockage that is present at birth (congenital obstruction). Past surgery on the kidney or the ureters. What increases the risk? The following factors may make you more likely to develop this condition: Having had a kidney stone in the past. Having a family history of kidney stones. Not drinking enough water. Eating a diet that is high in protein, salt (sodium), or sugar. Being overweight or obese. What are the signs or symptoms? Symptoms of a kidney stone may include: Pain in the side of the abdomen, right below the ribs (flank pain). Pain usually spreads (radiates) to the groin. Needing to urinate often or urgently. Painful urination. Blood in the urine (hematuria). Nausea. Vomiting. Fever and chills. How is this diagnosed? This condition may be diagnosed based on: Your  symptoms and medical history. A physical exam. Blood tests. Urine tests. These may be done before and after the stone passes out of your body through urination. Imaging tests, such as a CT scan, abdominal X-ray, or ultrasound. A procedure to examine the inside of the bladder (cystoscopy). How is this treated? Treatment for kidney stones depends on the size, location, and makeup of the stones. Kidney stones will often pass out of the body through urination. You may need to: Increase your fluid intake to help pass the stone. In some cases, you may be given fluids through an IV and may need to be monitored in the hospital. Take medicine for pain. Make changes in your diet to help prevent kidney stones from coming back. Sometimes, procedures are needed to remove a kidney stone. This may involve: A procedure to break up kidney stones using: A focused beam of light (laser therapy). Shock waves (extracorporeal shock wave lithotripsy). Surgery to remove kidney stones. This may be needed if you have severe pain or have stones that block your urinary tract. Follow these instructions at home: Medicines Take over-the-counter and prescription medicines only as told by your health care provider. Ask your health care provider if the medicine prescribed to you requires you to avoid driving or using heavy machinery. Eating and drinking Drink enough fluid to keep your urine pale yellow. You may be instructed to drink at least 8-10 glasses of water each day. This will help you pass the kidney stone. If directed, change your diet. This may include: Limiting how much sodium you eat. Eating more fruits   and vegetables. Limiting how much animal protein you eat. Animal proteins include red meat, poultry, fish, and eggs. Eating a normal amount of calcium (1,000-1,300 mg per day). Follow instructions from your health care provider about eating or drinking restrictions. General instructions Collect urine samples as  told by your health care provider. You may need to collect a urine sample: 24 hours after you pass the stone. 8-12 weeks after you pass the kidney stone, and every 6-12 months after that. Strain your urine every time you urinate, for as long as directed. Use the strainer that your health care provider recommends. Do not throw out the kidney stone after passing it. Keep the stone so it can be tested by your health care provider. Testing the makeup of your kidney stone may help prevent you from getting kidney stones in the future. Keep all follow-up visits. You may need follow-up X-rays or ultrasounds to make sure that your stone has passed. How is this prevented? To prevent another kidney stone: Drink enough fluid to keep your urine pale yellow. This is the best way to prevent kidney stones. Eat a healthy diet. Follow recommendations from your health care provider about foods to avoid. Recommendations vary depending on the type of kidney stone that you have. You may be instructed to eat a low-protein diet. Maintain a healthy weight. Where to find more information National Kidney Foundation (NKF): www.kidney.org [facility] [employer] [facility] (UCF): www.urologyhealth.org Contact a health care provider if: You have pain that gets worse or does not get better with medicine. Get help right away if: You have a fever or chills. You develop severe pain. You develop new abdominal pain. You faint. You are unable to urinate. Summary Kidney stones are solid, rock-like deposits that form inside of the kidneys. Kidney stones can cause nausea, vomiting, blood in the urine, abdominal pain, and the urge to urinate often. Treatment for kidney stones depends on the size, location, and makeup of the stones. Kidney stones will often pass out of the body through urination. Kidney stones can be prevented by drinking enough fluids, eating a healthy diet, and maintaining a healthy weight. This information is not intended  to replace advice given to you by your health care provider. Make sure you discuss any questions you have with your health care provider. Document Revised: 08/11/2021 Document Reviewed: 08/11/2021 Elsevier Patient Education  2024 Elsevier Inc.  

## 2023-10-06 NOTE — Progress Notes (Signed)
 Subjective:    Patient ID: Vincent Griffin, male    DOB: 11-23-1960, 63 y.o.   MRN: [id number]  Chief Complaint  Patient presents with   Hospitalization Follow-up    8mm kidney stone, ended up in ICU with infection.   Today's visit was for Transitional Care Management.  The patient was discharged from [facility] on 10/03/23 with a primary diagnosis of sepsis secondary to UTI.   Contact with the patient and/or caregiver, by a clinical staff member, was made on 10/04/23 and was documented as a telephone encounter within the EMR.  Through chart review and discussion with the patient I have determined that management of their condition is of moderate complexity.    Pt went to the ED on 09/30/23 with weakness. "63 y.o. 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 (status post Coil embolization right internal iliac artery (10 mm x 20 cm interlock and 8 mm x 20 cm interlock coils, Repair of right common iliac aneurysm with aortobiiliac stent graft, Placement of extension endovascular limb on the right on 09/08/2023), history of recurrent nephrolithiasis, history of cecal (colon) cancer s/p robotic assisted laparoscopic right hemicolectomy on 10/07/2022 , morbid obesity admitted on 09/30/2023 after presenting with left flank pain, dysuria, fever and leukocytosis and found to have sepsis secondary to gram-negative rod bacteremia and UTI with questionably infected nephrolithiasis.  Patient transferred to [facility] for urgent urological intervention. "  5/18 - Patient tolerated urological procedure on 10/01/2023 including cystoscopy, retrograde pyelogram and ureteral stent on the left.   5/19 - Septic shock resolved -urine output appropriate 5/20-patient continues to improve, approaching baseline, remains hypoxic in the setting of advanced COPD with ongoing tobacco use and abuse  Pt  received IV antibiotics and ureteral stent. He was discharged home on cefadroxil for a total of 11 days. He was discharged on home oxygen, but only uses at night.   Has a Urologists follow up.   HPI    Review of Systems  All other systems reviewed and are negative.   Social History   Socioeconomic History   Marital status: Married    Spouse name: Not on file   Number of children: Not on file   Years of education: Not on file   Highest education level: Not on file  Occupational History   Not on file  Tobacco Use   Smoking status: Heavy Smoker    Current packs/day: 1.00    Types: Cigarettes   Smokeless tobacco: Never  Vaping Use   Vaping status: Never Used  Substance and Sexual Activity   Alcohol use: No   Drug use: No   Sexual activity: Not on file  Other Topics Concern   Not on file  Social History Narrative   Not on file   Social Drivers of Health   Financial Resource Strain: Low Risk  (08/12/2020)   Received from [facility], [facility]   Overall Financial Resource Strain (CARDIA)    Difficulty of Paying Living Expenses: Not hard at all  Food Insecurity: No Food Insecurity (10/04/2023)   Hunger Vital Sign    Worried About Running Out of Food in the Last Year: Never true    Ran Out of Food in the Last Year: Never true  Transportation Needs: No Transportation Needs (10/04/2023)   PRAPARE - Transportation    Lack of Transportation (Medical): No    Lack  of Transportation (Non-Medical): No  Physical Activity: Not on file  Stress: No Stress Concern Present (08/12/2020)   Received from [facility], [facility] of Occupational Health - Occupational Stress Questionnaire    Feeling of Stress : Not at all  Social Connections: Unknown (09/20/2021)   Received from [facility], [facility]   Social Network    Social Network: Not on file   Family History  Problem Relation Age of Onset   Diabetes Mother    Stroke Mother    Colon polyps  Brother    Colon cancer Neg Hx         Objective:   Physical Exam Vitals reviewed.  Constitutional:      General: He is not in acute distress.    Appearance: He is well-developed. He is obese.  HENT:     Head: Normocephalic.     Right Ear: Tympanic membrane normal.     Left Ear: Tympanic membrane normal.  Eyes:     General:        Right eye: No discharge.        Left eye: No discharge.     Pupils: Pupils are equal, round, and reactive to [place].  Neck:     Thyroid: No thyromegaly.  Cardiovascular:     Rate and Rhythm: Normal rate and regular rhythm.     Heart sounds: Normal heart sounds. No murmur heard. Pulmonary:     Effort: Pulmonary effort is normal. No respiratory distress.     Breath sounds: Wheezing present.  Abdominal:     General: Bowel sounds are normal. There is no distension.     Palpations: Abdomen is soft.     Tenderness: There is no abdominal tenderness. There is right CVA tenderness (mild) and left CVA tenderness (mild).  Musculoskeletal:        General: No tenderness. Normal range of motion.     Cervical back: Normal range of motion and neck supple.  Skin:    General: Skin is warm and dry.     Coloration: Skin is pale.     Findings: No erythema or rash.  Neurological:     Mental Status: He is alert and oriented to person, place, and time.     Cranial Nerves: No cranial nerve deficit.     Deep Tendon Reflexes: Reflexes are normal and symmetric.  Psychiatric:        Behavior: Behavior normal.        Thought Content: Thought content normal.        Judgment: Judgment normal.       BP 128/87   Pulse 71   Temp 97.9 F (36.6 C)   Ht 5\' 10"  (1.778 m)   Wt (!) 306 lb 9.6 oz (139.1 kg)   SpO2 95%   BMI 43.99 kg/m      Assessment & Plan:  Vincent Griffin comes in today with chief complaint of Hospitalization Follow-up (8mm kidney stone, ended up in ICU with infection.)   Diagnosis and orders addressed:  1. Pulmonary emphysema, unspecified  emphysema type (HCC) (Primary) - CMP14+EGFR - CBC with Differential/Platelet  2. Obesity, Class III, BMI 40-49.9 (morbid obesity) - CMP14+EGFR - CBC with Differential/Platelet  3. Nephrolithiasis-8 mm stone in proximal left ureter with obstructive change. - CMP14+EGFR - CBC with Differential/Platelet  4. Hospital discharge follow-up  5. Severe sepsis (HCC)   6. Tobacco abuse   Labs pending Pt to call Urologists and make follow up visits  Force fluids Continue current medications  Keep follow up with specialists  Follow up with me in 3 months for chronic follow up    Tommas Fragmin, FNP

## 2023-10-07 LAB — CMP14+EGFR
ALT: 40 IU/L (ref 0–44)
AST: 31 IU/L (ref 0–40)
Albumin: 3.4 g/dL — ABNORMAL LOW (ref 3.8–4.9)
Alkaline Phosphatase: 120 IU/L (ref 44–121)
BUN/Creatinine Ratio: 13 (ref 10–24)
BUN: 13 mg/dL (ref 8–27)
Bilirubin Total: 0.5 mg/dL (ref 0.0–1.2)
CO2: 23 mmol/L (ref 20–29)
Calcium: 8.8 mg/dL (ref 8.6–10.2)
Chloride: 103 mmol/L (ref 96–106)
Creatinine, Ser: 0.99 mg/dL (ref 0.76–1.27)
Globulin, Total: 3 g/dL (ref 1.5–4.5)
Glucose: 89 mg/dL (ref 70–99)
Potassium: 4.8 mmol/L (ref 3.5–5.2)
Sodium: 142 mmol/L (ref 134–144)
Total Protein: 6.4 g/dL (ref 6.0–8.5)
eGFR: 87 mL/min/{1.73_m2} (ref >59)

## 2023-10-07 LAB — CBC WITH DIFFERENTIAL/PLATELET
Basophils Absolute: 0.1 10*3/uL (ref 0.0–0.2)
Basos: 1 %
EOS (ABSOLUTE): 0.2 10*3/uL (ref 0.0–0.4)
Eos: 2 %
Hematocrit: 42.6 % (ref 37.5–51.0)
Hemoglobin: 13.7 g/dL (ref 13.0–17.7)
Immature Grans (Abs): 0.1 10*3/uL (ref 0.0–0.1)
Immature Granulocytes: 1 %
Lymphocytes Absolute: 1.6 10*3/uL (ref 0.7–3.1)
Lymphs: 17 %
MCH: 32.2 pg (ref 26.6–33.0)
MCHC: 32.2 g/dL (ref 31.5–35.7)
MCV: 100 fL — ABNORMAL HIGH (ref 79–97)
Monocytes Absolute: 0.7 10*3/uL (ref 0.1–0.9)
Monocytes: 8 %
Neutrophils Absolute: 6.8 10*3/uL (ref 1.4–7.0)
Neutrophils: 71 %
Platelets: 216 10*3/uL (ref 150–450)
RBC: 4.26 x10E6/uL (ref 4.14–5.80)
RDW: 12.5 % (ref 11.6–15.4)
WBC: 9.6 10*3/uL (ref 3.4–10.8)

## 2023-10-10 ENCOUNTER — Ambulatory Visit: Attending: Vascular Surgery | Admitting: Vascular Surgery

## 2023-10-10 ENCOUNTER — Encounter: Payer: Self-pay | Admitting: Vascular Surgery

## 2023-10-10 ENCOUNTER — Ambulatory Visit: Payer: Self-pay | Admitting: Family

## 2023-10-10 VITALS — BP 114/81 | HR 66 | Temp 98.0°F | Ht 70.0 in | Wt 305.8 lb

## 2023-10-10 DIAGNOSIS — I723 Aneurysm of iliac artery: Secondary | ICD-10-CM

## 2023-10-10 MED ORDER — CEFADROXIL 500 MG PO CAPS: 1000.0000 mg | ORAL_CAPSULE | Freq: Two times a day (BID) | ORAL | 0 refills | Status: AC

## 2023-10-10 NOTE — Progress Notes (Signed)
 Patient name: Vincent Griffin MRN: [id number] DOB: January 29, 1961 Sex: male  REASON FOR CONSULT: Post-op follow-up s/p repair right common iliac artery aneurysm with EVAR  HPI: Vincent Griffin is a 63 y.o. male, with history CAD status post MI, hypertension, hyperlipidemia that presents for post-op after recent coil embolization of the right hypogastric with aortobiiliac stent graft on 09/08/2023 for 3.5 cm right common iliac artery aneurysm.  He did well and was discharged home.  Unfortunately about 3 weeks after his stent graft was placed he ended up back in the hospital with sepsis from a kidney stone.  Blood cultures grew Proteus.  He was discharged on 2 weeks of cefadroxil.   Past Medical History:  Diagnosis Date   CAD (coronary artery disease)    Cancer (HCC)    colon   History of kidney stones    Hypercholesterolemia    Hypertension    Iliac artery aneurysm, right (HCC)    Myocardial infarction (HCC) 2022   Peripheral vascular disease (HCC)     Past Surgical History:  Procedure Laterality Date   ABDOMINAL AORTIC ENDOVASCULAR STENT GRAFT N/A 09/08/2023   Procedure: INSERTION, ENDOVASCULAR STENT GRAFT, AORTA, ABDOMINAL;  Surgeon: [name], MD;  Location: [facility] OR;  Service: Vascular;  Laterality: N/A;   ANEURYSM COILING Left 09/08/2023   Procedure: EMBOLIZATION, USING COIL, OF RIGHT INTERNAL ILIAC;  Surgeon: [name], MD;  Location: [facility] OR;  Service: Vascular;  Laterality: Left;   APPENDECTOMY     BIOPSY  09/08/2022   Procedure: BIOPSY;  Surgeon: Vinetta Greening, DO;  Location: [facility] ENDO SUITE;  Service: Endoscopy;;   BIOPSY  04/10/2023   Procedure: BIOPSY;  Surgeon: Vinetta Greening, DO;  Location: [facility];  Service: Endoscopy;;   CARDIAC CATHETERIZATION  07/2020   with stent.   CATARACT EXTRACTION     CATARACT EXTRACTION W/PHACO Left 01/07/2016   Procedure: CATARACT EXTRACTION PHACO AND INTRAOCULAR LENS PLACEMENT LEFT EYE CDE=13.32;  Surgeon: [name],  MD;  Location: [facility] ORS;  Service: Ophthalmology;  Laterality: Left;   COLONOSCOPY WITH PROPOFOL  N/A 09/08/2022   Procedure: COLONOSCOPY WITH PROPOFOL ;  Surgeon: Vinetta Greening, DO;  Location: [facility];  Service: Endoscopy;  Laterality: N/A;  11:30 am   COLONOSCOPY WITH PROPOFOL  N/A 04/10/2023   Procedure: COLONOSCOPY WITH PROPOFOL ;  Surgeon: Vinetta Greening, DO;  Location: [facility];  Service: Endoscopy;  Laterality: N/A;  100pm, asa 3   CYSTOSCOPY W/ URETERAL STENT PLACEMENT Left 10/01/2023   Procedure: CYSTOSCOPY, WITH RETROGRADE PYELOGRAM AND URETERAL STENT INSERTION;  Surgeon: Mallie Seal, MD;  Location: [facility] ORS;  Service: Urology;  Laterality: Left;   POLYPECTOMY  09/08/2022   Procedure: POLYPECTOMY;  Surgeon: Vinetta Greening, DO;  Location: [facility];  Service: Endoscopy;;   POLYPECTOMY  04/10/2023   Procedure: POLYPECTOMY;  Surgeon: Vinetta Greening, DO;  Location: [facility];  Service: Endoscopy;;   ROBOT ASSISTED LAPAROSCOPIC PARTIAL COLECTOMY  10/07/2022   SMALL INTESTINE SURGERY  1972   SUBMUCOSAL TATTOO INJECTION  09/08/2022   Procedure: SUBMUCOSAL TATTOO INJECTION;  Surgeon: Vinetta Greening, DO;  Location: [facility];  Service: Endoscopy;;    Family History  Problem Relation Age of Onset   Diabetes Mother    Stroke Mother    Colon polyps Brother    Colon cancer Neg Hx     SOCIAL HISTORY: Social History   Socioeconomic History   Marital status: Married  Spouse name: Not on file   Number of children: Not on file   Years of education: Not on file   Highest education level: Not on file  Occupational History   Not on file  Tobacco Use   Smoking status: Heavy Smoker    Current packs/day: 1.00    Types: Cigarettes   Smokeless tobacco: Never  Vaping Use   Vaping status: Never Used  Substance and Sexual Activity   Alcohol use: No   Drug use: No   Sexual activity: Not on file  Other Topics Concern   Not on file  Social History  Narrative   Not on file   Social Drivers of Health   Financial Resource Strain: Low Risk  (08/12/2020)   Received from [facility], [facility]   Overall Financial Resource Strain (CARDIA)    Difficulty of Paying Living Expenses: Not hard at all  Food Insecurity: No Food Insecurity (10/04/2023)   Hunger Vital Sign    Worried About Running Out of Food in the Last Year: Never true    Ran Out of Food in the Last Year: Never true  Transportation Needs: No Transportation Needs (10/04/2023)   PRAPARE - Administrator, Civil Service (Medical): No    Lack of Transportation (Non-Medical): No  Physical Activity: Not on file  Stress: No Stress Concern Present (08/12/2020)   Received from [facility], [facility] of Occupational Health - Occupational Stress Questionnaire    Feeling of Stress : Not at all  Social Connections: Unknown (09/20/2021)   Received from [facility], [facility]   Social Network    Social Network: Not on file  Intimate Partner Violence: Not At Risk (10/04/2023)   Humiliation, Afraid, Rape, and Kick questionnaire    Fear of Current or Ex-Partner: No    Emotionally Abused: No    Physically Abused: No    Sexually Abused: No    Allergies  Allergen Reactions   Zetia [Ezetimibe] Diarrhea and Nausea Only    Upset stomach    Current Outpatient Medications  Medication Sig Dispense Refill   albuterol  (VENTOLIN  HFA) 108 (90 Base) MCG/ACT inhaler Inhale 2 puffs into the lungs every 6 (six) hours as needed for wheezing or shortness of breath. 8 g 2   aspirin  81 MG chewable tablet Chew 81 mg by mouth in the morning.     atorvastatin  (LIPITOR) 80 MG tablet Take 80 mg by mouth at bedtime.     budesonide -glycopyrrolate -formoterol  (BREZTRI  AEROSPHERE) 160-9-4.8 MCG/ACT AERO inhaler Inhale 2 puffs into the lungs 2 (two) times daily. 10.7 g 11   cefadroxil (DURICEF) 500 MG capsule Take 2 capsules (1,000 mg total) by mouth 2 (two) times daily  for 11 days. 44 capsule 0   Ferrous Sulfate (IRON PO) Take 1-2 tablets by mouth as needed. (Patient not taking: Reported on 10/06/2023)     metoprolol  succinate (TOPROL -XL) 25 MG 24 hr tablet Take 25 mg by mouth in the morning.     nitroGLYCERIN  (NITROSTAT ) 0.4 MG SL tablet Place 0.4 mg under the tongue every 5 (five) minutes as needed for chest pain.     oxyCODONE -acetaminophen  (PERCOCET) 5-325 MG tablet Take 1 tablet by mouth every 6 (six) hours as needed for severe pain (pain score 7-10). 8 tablet 0   Current Facility-Administered Medications  Medication Dose Route Frequency Provider Last Rate Last Admin   cyanocobalamin  (VITAMIN B12) injection 1,000 mcg  1,000 mcg Intramuscular Q30 days Tommas Fragmin A,  FNP   1,000 mcg at 09/19/23 1544    REVIEW OF SYSTEMS:  [X]  denotes positive finding, [ ]  denotes negative finding Cardiac  Comments:  Chest pain or chest pressure:    Shortness of breath upon exertion:    Short of breath when lying flat:    Irregular heart rhythm:        Vascular    Pain in calf, thigh, or hip brought on by ambulation:    Pain in feet at night that wakes you up from your sleep:     Blood clot in your veins:    Leg swelling:         Pulmonary    Oxygen at home:    Productive cough:     Wheezing:         Neurologic    Sudden weakness in arms or legs:     Sudden numbness in arms or legs:     Sudden onset of difficulty speaking or slurred speech:    Temporary loss of vision in one eye:     Problems with dizziness:         Gastrointestinal    Blood in stool:     Vomited blood:         Genitourinary    Burning when urinating:     Blood in urine:        Psychiatric    Major depression:         Hematologic    Bleeding problems:    Problems with blood clotting too easily:        Skin    Rashes or ulcers:        Constitutional    Fever or chills:      PHYSICAL EXAM: There were no vitals filed for this visit.  GENERAL: The patient is a  well-nourished male, in no acute distress. The vital signs are documented above. CARDIAC: There is a regular rate and rhythm.  VASCULAR:  Bilateral femoral pulses palpable Bilateral DP pulses palpable Bilateral lower extremity venous stasis changes PULMONARY: No respiratory distress. ABDOMEN: Soft and non-tender. MUSCULOSKELETAL: There are no major deformities or cyanosis. NEUROLOGIC: No focal weakness or paresthesias are detected. SKIN: There are no ulcers or rashes noted. PSYCHIATRIC: The patient has a normal affect.  DATA:   CTA reviewed from 09/27/2023 with stent graft in good position and adequate treatment of right common iliac aneurysm.  Both limbs are widely patent.  There is coiling of the right hypogastric.  No stranding around the graft.  Assessment/Plan:  63 y.o. male, with history CAD status post MI, hypertension, hyperlipidemia that presents for post-op after recent coil embolization of the right hypogastric with aortobiiliac stent graft on 09/08/2023 for 3.5 cm right common iliac artery aneurysm.  Discussed his postop CT looks great from 09/27/2023.  Appropriate exclusion of the right common iliac aneurysm with coils of the right hypogastric in good position.  About 3 weeks after his surgery he got readmitted with sepsis from an obstructing ureteral stone.  Blood cultures grew Proteus.  He was discharged on 2 weeks of PO cefadroxil.  I have discussed with our clinical pharmacist in the office and we reviewed his culture data.  I would like him to be on 6 weeks of antibiotics given risk of infection to his aortobiliac stent graft with bacteremia just 3 weeks after placement.  I did review his CT and I see no stranding around the graft to suggest infection but I am being  very aggressive and cautious.  I have updated him with this plan and sent another 30 days of cefadroxil to his pharmacy.  If this stent graft got infected would be highly morbid with high risk of mortality.  I will  otherwise plan to see him in 6 months with EVAR duplex.  [name], MD [facility] Office: [phone]

## 2023-10-12 ENCOUNTER — Telehealth: Payer: Self-pay

## 2023-10-12 ENCOUNTER — Other Ambulatory Visit: Payer: Self-pay | Admitting: *Deleted

## 2023-10-12 DIAGNOSIS — I723 Aneurysm of iliac artery: Secondary | ICD-10-CM

## 2023-10-12 NOTE — Telephone Encounter (Signed)
 Pt called c/o pain (6-7/10) in right groin when he lifts his leg to knee height.  He reported no swelling, no lump in the groin, normal sensation to leg and normal leg temperature.  He stated the pain started after seeing Dr. Fulton Job on 10/10/23 for his pre-op (Cystoscopy) appt.    Patient advised to take tylenol  for pain and continue to monitor the pain.  Patient advised to call back if the pain and/or swelling increases, change in temperature and color of the groin and leg occurs.

## 2023-10-13 ENCOUNTER — Other Ambulatory Visit: Payer: Self-pay | Admitting: Urology

## 2023-10-13 NOTE — Progress Notes (Signed)
 Fixed.   Tommas Fragmin, FNP

## 2023-10-13 NOTE — Addendum Note (Signed)
 Addended by: Tommas Fragmin A on: 10/13/2023 10:20 AM   Modules accepted: Level of Service

## 2023-10-16 ENCOUNTER — Telehealth: Payer: Self-pay

## 2023-10-16 ENCOUNTER — Other Ambulatory Visit: Payer: Self-pay | Admitting: Vascular Surgery

## 2023-10-16 DIAGNOSIS — I723 Aneurysm of iliac artery: Secondary | ICD-10-CM

## 2023-10-16 NOTE — Telephone Encounter (Addendum)
 Patient called c/o bilateral groin pain that is preventing him from walking, stating, "I can't move my legs!"  Patient reporting 10/10 pain in the groin areas when walking.  Pt reports bilateral swelling of the feet and ankles.  Patient reports feet are normal temperature. Patient advised to elevate his feet.  Consulting with Sam, [place].  Advised to have pt come in for bilateral pseudoaneurysm US .  Working on scheduling appts for the US  and to f/u with PA.  Pt advised to go to the ED if the pain worsens, swelling increases or he experiences temperature or color changes to his feet.  Pt sent in questionnaire about his groin pain.  Per Dr. Fulton Job, patient should go to the ED to have his leg pain evaluated.    US  and f/u with PA will not be scheduled.  This has been communicated to the patient.  Vincent Hensen, MD  Taunya Fass, RN2 hours ago (11:01 AM)    I think the swelling is normal.  If he cannot walk that is likely unrelated to his recent surgery and probably needs to be evaluated in the ED.  He does have kidney stones with recent sepsis that complicates his picture.  Thanks,  Vincent Griffin, Vincent Loft, RN  Vincent Griffin, MD4 hours ago (8:33 AM)    Vincent Griffin - from patient.

## 2023-10-17 ENCOUNTER — Encounter (HOSPITAL_COMMUNITY)

## 2023-10-17 ENCOUNTER — Telehealth: Payer: Self-pay | Admitting: Family

## 2023-10-17 ENCOUNTER — Telehealth: Payer: Self-pay

## 2023-10-17 ENCOUNTER — Ambulatory Visit

## 2023-10-17 DIAGNOSIS — R1030 Lower abdominal pain, unspecified: Secondary | ICD-10-CM | POA: Diagnosis not present

## 2023-10-17 NOTE — Telephone Encounter (Signed)
 Clearance form faxed successfully to [facility]. Zona, surgery scheduler aware.

## 2023-10-17 NOTE — Telephone Encounter (Signed)
 Pt did not receive messages left by us  yesterday regarding his appt that had been scheduled today. I was able to connect with him today and advised him that swelling is normal but if he is having difficulties walking and moving his leg, then MD advised him to report to the ED. Pt verbalized understanding.

## 2023-10-19 ENCOUNTER — Other Ambulatory Visit: Payer: Self-pay | Admitting: *Deleted

## 2023-10-20 ENCOUNTER — Ambulatory Visit: Admitting: Family Medicine

## 2023-10-20 DIAGNOSIS — E538 Deficiency of other specified B group vitamins: Secondary | ICD-10-CM | POA: Diagnosis not present

## 2023-10-20 NOTE — Progress Notes (Addendum)
 COVID Vaccine Completed:  Date of COVID positive in last 90 days:  PCP - Tommas Fragmin, FNP Cardiologist - Dr. Theotis Flake Means   Clearance by Jimmye Moulds, MD in media tab dated 10/18/23  Chest x-ray - 09/30/23 Epic EKG - 09/30/23 Epic Stress Test - 07/20/23 [facility] ECHO - 10/01/23 Epic Cardiac Cath - 08/12/20 [facility] Pacemaker/ICD device last checked: n/a Spinal Cord Stimulator:n/a  Bowel Prep - no  Sleep Study - n/a CPAP -   Fasting Blood Sugar - n/a Checks Blood Sugar _____ times a day  Last dose of GLP1 agonist-  N/A GLP1 instructions:  Hold 7 days before surgery    Last dose of SGLT-2 inhibitors-  N/A SGLT-2 instructions:  Hold 3 days before surgery    Blood Thinner Instructions:  Last dose:   Time: Aspirin  Instructions: ASA 81, hold 5 days Last Dose:  Activity level: Can go up a flight of stairs and perform activities of daily living without stopping and without symptoms of chest pain or shortness of breath.  Anesthesia review: CAD, COPD, HTN, MI, pulmonary emphysema, stent x1, STOPBANG 7  Patient denies shortness of breath, fever, cough and chest pain at PAT appointment  Patient verbalized understanding of instructions that were given to them at the PAT appointment. Patient was also instructed that they will need to review over the PAT instructions again at home before surgery.

## 2023-10-20 NOTE — Patient Instructions (Addendum)
 SURGICAL WAITING ROOM VISITATION  Patients having surgery or a procedure may have no more than 2 support people in the waiting area - these visitors may rotate.    Children under the age of 46 must have an adult with them who is not the patient.  Visitors with respiratory illnesses are discouraged from visiting and should remain at home.  If the patient needs to stay at the hospital during part of their recovery, the visitor guidelines for inpatient rooms apply. Pre-op nurse will coordinate an appropriate time for 1 support person to accompany patient in pre-op.  This support person may not rotate.    Please refer to the [facility] website for the visitor guidelines for Inpatients (after your surgery is over and you are in a regular room).    Your procedure is scheduled on: 10/25/23   Report to [facility] Main Entrance    Report to admitting at 6:15AM   Call this number if you have problems the morning of surgery [phone]   Do not eat food or drink liquids :After Midnight.          If you have questions, please contact your surgeon's office.   FOLLOW BOWEL PREP AND ANY ADDITIONAL PRE OP INSTRUCTIONS YOU RECEIVED FROM YOUR SURGEON'S OFFICE!!!     Oral Hygiene is also important to reduce your risk of infection.                                    Remember - BRUSH YOUR TEETH THE MORNING OF SURGERY WITH YOUR REGULAR TOOTHPASTE  DENTURES WILL BE REMOVED PRIOR TO SURGERY PLEASE DO NOT APPLY "Poly grip" OR ADHESIVES!!!   Do NOT smoke after Midnight   Stop all vitamins and herbal supplements 7 days before surgery.   Take these medicines the morning of surgery with A SIP OF WATER : Inhalers, Metoprolol , Oxycodone                                You may not have any metal on your body including jewelry, and body piercing             Do not wear lotions, powders, cologne, or deodorant              Men may shave face and neck.   Do not bring valuables to the hospital. [facility] IS NOT             RESPONSIBLE   FOR VALUABLES.   Contacts, glasses, dentures or bridgework may not be worn into surgery.  DO NOT BRING YOUR HOME MEDICATIONS TO THE HOSPITAL. PHARMACY WILL DISPENSE MEDICATIONS LISTED ON YOUR MEDICATION LIST TO YOU DURING YOUR ADMISSION IN THE HOSPITAL!    Patients discharged on the day of surgery will not be allowed to drive home.  Someone NEEDS to stay with you for the first 24 hours after anesthesia.              Please read over the following fact sheets you were given: IF YOU HAVE QUESTIONS ABOUT YOUR PRE-OP INSTRUCTIONS PLEASE CALL [phone] Party   If you received a COVID test during your pre-op visit  it is requested that you wear a mask when out in public, stay away from anyone that may not be feeling well and notify your surgeon if you develop symptoms. If you  test positive for Covid or have been in contact with anyone that has tested positive in the last 10 days please notify you surgeon.     - Preparing for Surgery Before surgery, you can play an important role.  Because skin is not sterile, your skin needs to be as free of germs as possible.  You can reduce the number of germs on your skin by washing with CHG (chlorahexidine gluconate) soap before surgery.  CHG is an antiseptic cleaner which kills germs and bonds with the skin to continue killing germs even after washing. Please DO NOT use if you have an allergy to CHG or antibacterial soaps.  If your skin becomes reddened/irritated stop using the CHG and inform your nurse when you arrive at Short Stay. Do not shave (including legs and underarms) for at least 48 hours prior to the first CHG shower.  You may shave your face/neck.  Please follow these instructions carefully:  1.  Shower with CHG Soap the night before surgery and the  morning of surgery.  2.  If you choose to wash your hair, wash your hair first as usual with your normal  shampoo.  3.  After you shampoo, rinse  your hair and body thoroughly to remove the shampoo.                             4.  Use CHG as you would any other liquid soap.  You can apply chg directly to the skin and wash.  Gently with a scrungie or clean washcloth.  5.  Apply the CHG Soap to your body ONLY FROM THE NECK DOWN.   Do   not use on face/ open                           Wound or open sores. Avoid contact with eyes, ears mouth and   genitals (private parts).                       Wash face,  Genitals (private parts) with your normal soap.             6.  Wash thoroughly, paying special attention to the area where your    surgery  will be performed.  7.  Thoroughly rinse your body with warm water  from the neck down.  8.  DO NOT shower/wash with your normal soap after using and rinsing off the CHG Soap.                9.  Pat yourself dry with a clean towel.            10.  Wear clean pajamas.            11.  Place clean sheets on your bed the night of your first shower and do not  sleep with pets. Day of Surgery : Do not apply any lotions/deodorants the morning of surgery.  Please wear clean clothes to the hospital/surgery center.  FAILURE TO FOLLOW THESE INSTRUCTIONS MAY RESULT IN THE CANCELLATION OF YOUR SURGERY  PATIENT SIGNATURE_________________________________  NURSE SIGNATURE__________________________________  ________________________________________________________________________

## 2023-10-20 NOTE — Progress Notes (Signed)
 Patient given b12 right arm and tolerated well.

## 2023-10-20 NOTE — Progress Notes (Signed)
 Patient given b12 right arm tolerated well

## 2023-10-23 ENCOUNTER — Encounter (HOSPITAL_COMMUNITY)
Admission: RE | Admit: 2023-10-23 | Discharge: 2023-10-23 | Disposition: A | Discharge status: Home / Self Care | Source: Ambulatory Visit | Attending: Urology | Admitting: Urology

## 2023-10-23 ENCOUNTER — Ambulatory Visit

## 2023-10-23 ENCOUNTER — Other Ambulatory Visit: Payer: Self-pay

## 2023-10-23 ENCOUNTER — Encounter (HOSPITAL_COMMUNITY): Payer: Self-pay

## 2023-10-23 DIAGNOSIS — N132 Hydronephrosis with renal and ureteral calculous obstruction: Secondary | ICD-10-CM | POA: Diagnosis not present

## 2023-10-23 DIAGNOSIS — Z01818 Encounter for other preprocedural examination: Secondary | ICD-10-CM | POA: Insufficient documentation

## 2023-10-23 DIAGNOSIS — F17200 Nicotine dependence, unspecified, uncomplicated: Secondary | ICD-10-CM | POA: Insufficient documentation

## 2023-10-23 DIAGNOSIS — J449 Chronic obstructive pulmonary disease, unspecified: Secondary | ICD-10-CM | POA: Diagnosis not present

## 2023-10-23 DIAGNOSIS — I739 Peripheral vascular disease, unspecified: Secondary | ICD-10-CM | POA: Diagnosis not present

## 2023-10-23 DIAGNOSIS — Z955 Presence of coronary angioplasty implant and graft: Secondary | ICD-10-CM | POA: Diagnosis not present

## 2023-10-23 DIAGNOSIS — I2510 Atherosclerotic heart disease of native coronary artery without angina pectoris: Secondary | ICD-10-CM | POA: Diagnosis not present

## 2023-10-23 DIAGNOSIS — I10 Essential (primary) hypertension: Secondary | ICD-10-CM | POA: Diagnosis not present

## 2023-10-23 DIAGNOSIS — N201 Calculus of ureter: Secondary | ICD-10-CM | POA: Insufficient documentation

## 2023-10-23 DIAGNOSIS — Z79899 Other long term (current) drug therapy: Secondary | ICD-10-CM | POA: Diagnosis not present

## 2023-10-23 DIAGNOSIS — F17210 Nicotine dependence, cigarettes, uncomplicated: Secondary | ICD-10-CM | POA: Diagnosis not present

## 2023-10-23 HISTORY — DX: Chronic obstructive pulmonary disease, unspecified: J44.9

## 2023-10-23 HISTORY — DX: Gastro-esophageal reflux disease without esophagitis: K21.9

## 2023-10-23 HISTORY — DX: Anxiety disorder, unspecified: F41.9

## 2023-10-23 HISTORY — DX: Depression, unspecified: F32.A

## 2023-10-23 HISTORY — DX: Pneumonia, unspecified organism: J18.9

## 2023-10-23 NOTE — Progress Notes (Signed)
 Anesthesia Chart Review   Case: [id number] Date/Time: 10/25/23 0815   Procedure: CYSTOSCOPY/URETEROSCOPY/HOLMIUM LASER/STENT PLACEMENT (Left)   Anesthesia type: General   Diagnosis: Ureteral stone [N20.1]   Pre-op diagnosis: LEFT URETERAL STONE   Location[facility] ROOM 05 / [facility]   Surgeons: Mallie Seal, MD       DISCUSSION:62 y.o. smoker with h/o HTN, COPD, CAD s/p DES, PVD, left ureteral stone scheduled for above procedure 10/25/2023 with Dr. Josefine Nice.   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    3 weeks following stent graft he was readmitted for sepsis due to kidney stone. Discharge on 2 weeks of cefadroxil .   Last seen by vascular surgeon 10/10/2023. Per OV note, "About 3 weeks after his surgery he got readmitted with sepsis from an obstructing ureteral stone.  Blood cultures grew Proteus.  He was discharged on 2 weeks of PO cefadroxil .  I have discussed with our clinical pharmacist in the office and we reviewed his culture data.  I would like him to be on 6 weeks of antibiotics given risk of infection to his aortobiliac stent graft with bacteremia just 3 weeks after placement.  I did review his CT and I see no stranding around the graft to suggest infection but I am being very aggressive and cautious.  I have updated him with this plan and sent another 30 days of cefadroxil  to his pharmacy.  If this stent graft got infected would be highly morbid with high risk of mortality.  I will otherwise plan to see him in 6 months with EVAR duplex "  Clearance received from vascular surgeon to proceed.   He was last seen by cardiology 11/2022, stable at this visit with 1 year follow up.   Low risk stress test 07/20/2023.   Per previous anesthesia note, "Difficulty Due To: Difficulty was unanticipated Comments: Placed with ease grade 1 view with miller #2 - positive bilateral breath sounds ETCo2   Soft airway placed "   VS: BP 118/78   Pulse 72   Temp 36.9 C (Oral)   Resp 14   Ht 5\' 10"  (1.778 m)   Wt (!) 140.6 kg   SpO2 94%   BMI 44.48 kg/m   PROVIDERS: Yevette Hem, FNP is PCP    LABS: Labs reviewed: Acceptable for surgery. (all labs ordered are listed, but only abnormal results are displayed)  Labs Reviewed - No data to display   IMAGES:   EKG:   CV: Echo 10/01/2023 1. Left ventricular ejection fraction, by estimation, is 65 to 70%. The  left ventricle has normal function. The left ventricle has no regional  wall motion abnormalities. Left ventricular diastolic parameters are  consistent with Grade I diastolic  dysfunction (impaired relaxation).   2. Right ventricular systolic function is normal. The right ventricular  size is normal.   3. The mitral valve is normal in structure. No evidence of mitral valve  regurgitation. No evidence of mitral stenosis.   4. The aortic valve was not well visualized. Aortic valve regurgitation  is not visualized. No aortic stenosis is present.   5. The inferior vena cava is normal in size with greater than 50%  respiratory variability, suggesting right atrial pressure of 3 mmHg.   Conclusion(s)/Recommendation(s): Technically limited study due to poor  sound wave transmission.   Myocardial Perfusion 07/20/2023 1. No evidence of inducible ischemia.  2. Normal left  ventricular function and wall motion.  3. Normal vasodilator stress ecg.  4. Overall, low risk study.  Past Medical History:  Diagnosis Date   Anxiety    CAD (coronary artery disease)    Cancer (HCC)    colon   COPD (chronic obstructive pulmonary disease) (HCC)    Depression    GERD (gastroesophageal reflux disease)    History of kidney stones    Hypercholesterolemia    Hypertension    Iliac artery aneurysm, right (HCC)    Myocardial infarction (HCC) 2022   Peripheral vascular disease (HCC)    Pneumonia     Past Surgical History:  Procedure  Laterality Date   ABDOMINAL AORTIC ENDOVASCULAR STENT GRAFT N/A 09/08/2023   Procedure: INSERTION, ENDOVASCULAR STENT GRAFT, AORTA, ABDOMINAL;  Surgeon: [name], MD;  Location: [facility];  Service: Vascular;  Laterality: N/A;   ANEURYSM COILING Left 09/08/2023   Procedure: EMBOLIZATION, USING COIL, OF RIGHT INTERNAL ILIAC;  Surgeon: [name], MD;  Location: [facility] OR;  Service: Vascular;  Laterality: Left;   APPENDECTOMY     BIOPSY  09/08/2022   Procedure: BIOPSY;  Surgeon: Vinetta Greening, DO;  [facility]: [facility];  Service: Endoscopy;;   BIOPSY  04/10/2023   Procedure: BIOPSY;  Surgeon: Vinetta Greening, DO;  [facility];  Service: Endoscopy;;   CARDIAC CATHETERIZATION  07/2020   with stent.   CATARACT EXTRACTION     CATARACT EXTRACTION W/PHACO Left 01/07/2016   Procedure: CATARACT EXTRACTION PHACO AND INTRAOCULAR LENS PLACEMENT LEFT EYE CDE=13.32;  Surgeon: [name], MD;  Location: [facility];  Service: Ophthalmology;  Laterality: Left;   COLONOSCOPY WITH PROPOFOL  N/A 09/08/2022   Procedure: COLONOSCOPY WITH PROPOFOL ;  Surgeon: Vinetta Greening, DO;  [facility];  Service: Endoscopy;  Laterality: N/A;  11:30 am   COLONOSCOPY WITH PROPOFOL  N/A 04/10/2023   Procedure: COLONOSCOPY WITH PROPOFOL ;  Surgeon: Vinetta Greening, DO;  [facility];  Service: Endoscopy;  Laterality: N/A;  100pm, asa 3   CYSTOSCOPY W/ URETERAL STENT PLACEMENT Left 10/01/2023   Procedure: CYSTOSCOPY, WITH RETROGRADE PYELOGRAM AND URETERAL STENT INSERTION;  Surgeon: Mallie Seal, MD;  Location: [facility];  Service: Urology;  Laterality: Left;   POLYPECTOMY  09/08/2022   Procedure: POLYPECTOMY;  Surgeon: Vinetta Greening, DO;  [facility];  Service: Endoscopy;;   POLYPECTOMY  04/10/2023   Procedure: POLYPECTOMY;  Surgeon: Vinetta Greening, DO;  [facility];  Service: Endoscopy;;   ROBOT ASSISTED LAPAROSCOPIC PARTIAL COLECTOMY  10/07/2022    SMALL INTESTINE SURGERY  1972   SUBMUCOSAL TATTOO INJECTION  09/08/2022   Procedure: SUBMUCOSAL TATTOO INJECTION;  Surgeon: Vinetta Greening, DO;  [facility];  Service: Endoscopy;;    MEDICATIONS:  albuterol  (VENTOLIN  HFA) 108 (90 Base) MCG/ACT inhaler   aspirin  81 MG chewable tablet   atorvastatin  (LIPITOR) 80 MG tablet   budesonide -glycopyrrolate -formoterol  (BREZTRI  AEROSPHERE) 160-9-4.8 MCG/ACT AERO inhaler   cefadroxil  (DURICEF) 500 MG capsule   ketorolac  (TORADOL ) 10 MG tablet   metoprolol  succinate (TOPROL -XL) 25 MG 24 hr tablet   nitroGLYCERIN  (NITROSTAT ) 0.4 MG SL tablet   oxyCODONE -acetaminophen  (PERCOCET) 5-325 MG tablet    cyanocobalamin  (VITAMIN B12) injection 1,000 mcg   Lynea Rollison Ward, PA-C [facility] Pre-Surgical Testing [phone]

## 2023-10-23 NOTE — Progress Notes (Signed)
   10/23/23 1014  OBSTRUCTIVE SLEEP APNEA  Have you ever been diagnosed with sleep apnea through a sleep study? No  Do you snore loudly (loud enough to be heard through closed doors)?  1  Do you often feel tired, fatigued, or sleepy during the daytime (such as falling asleep during driving or talking to someone)? 1  Has anyone observed you stop breathing during your sleep? 1  Do you have, or are you being treated for high blood pressure? 1  BMI more than 35 kg/m2? 1  Age > 50 (1-yes) 1  Neck circumference greater than:Male 16 inches or larger, Male 17inches or larger? 0  Male Gender (Yes=1) 1  Obstructive Sleep Apnea Score 7

## 2023-10-23 NOTE — Anesthesia Preprocedure Evaluation (Signed)
 Anesthesia Evaluation  Patient identified by MRN, date of birth, ID band Patient awake    Reviewed: Allergy & Precautions, NPO status , Patient's Chart, lab work & pertinent test results  Airway Mallampati: III  TM Distance: >3 FB Neck ROM: Full    Dental no notable dental hx.    Pulmonary COPD, Current Smoker   Pulmonary exam normal        Cardiovascular hypertension, Pt. on medications and Pt. on home beta blockers + CAD, + Past MI, + Cardiac Stents and + Peripheral Vascular Disease   Rhythm:Regular Rate:Normal  Echo 10/01/2023 1. Left ventricular ejection fraction, by estimation, is 65 to 70%. The  left ventricle has normal function. The left ventricle has no regional  wall motion abnormalities. Left ventricular diastolic parameters are  consistent with Grade I diastolic  dysfunction (impaired relaxation).   2. Right ventricular systolic function is normal. The right ventricular  size is normal.   3. The mitral valve is normal in structure. No evidence of mitral valve  regurgitation. No evidence of mitral stenosis.   4. The aortic valve was not well visualized. Aortic valve regurgitation  is not visualized. No aortic stenosis is present.   5. The inferior vena cava is normal in size with greater than 50%  respiratory variability, suggesting right atrial pressure of 3 mmHg.   Conclusion(s)/Recommendation(s): Technically limited study due to poor  sound wave transmission.     Neuro/Psych   Anxiety Depression    negative neurological ROS     GI/Hepatic Neg liver ROS,GERD  ,,  Endo/Other  negative endocrine ROS    Renal/GU Renal diseaseUreteral stone  negative genitourinary   Musculoskeletal negative musculoskeletal ROS (+)    Abdominal  (+) + obese  Peds  Hematology Lab Results      Component                Value               Date                      WBC                      9.6                 10/06/2023                 HGB                      13.7                10/06/2023                HCT                      42.6                10/06/2023                MCV                      100 (H)             10/06/2023                PLT  216                 10/06/2023             Lab Results      Component                Value               Date                      NA                       142                 10/06/2023                K                        4.8                 10/06/2023                CO2                      23                  10/06/2023                GLUCOSE                  89                  10/06/2023                BUN                      13                  10/06/2023                CREATININE               0.99                10/06/2023                CALCIUM                   8.8                 10/06/2023                EGFR                     87                  10/06/2023                GFRNONAA                 >60                 10/03/2023              Anesthesia Other Findings   Reproductive/Obstetrics  Anesthesia Physical Anesthesia Plan  ASA: 3  Anesthesia Plan: General   Post-op Pain Management:    Induction: Intravenous  PONV Risk Score and Plan: 1 and Ondansetron , Dexamethasone , Midazolam  and Treatment may vary due to age or medical condition  Airway Management Planned: Mask and LMA  Additional Equipment: None  Intra-op Plan:   Post-operative Plan: Extubation in OR  Informed Consent: I have reviewed the patients History and Physical, chart, labs and discussed the procedure including the risks, benefits and alternatives for the proposed anesthesia with the patient or authorized representative who has indicated his/her understanding and acceptance.     Dental advisory given  Plan Discussed with: CRNA  Anesthesia Plan Comments: (See PAT note 10/23/2023)        Anesthesia Quick Evaluation

## 2023-10-24 ENCOUNTER — Ambulatory Visit: Admitting: Family

## 2023-10-25 ENCOUNTER — Ambulatory Visit (HOSPITAL_COMMUNITY)

## 2023-10-25 ENCOUNTER — Ambulatory Visit (HOSPITAL_COMMUNITY): Admitting: Certified Registered Nurse Anesthetist

## 2023-10-25 ENCOUNTER — Ambulatory Visit: Payer: Self-pay

## 2023-10-25 ENCOUNTER — Ambulatory Visit (HOSPITAL_COMMUNITY): Payer: Self-pay | Admitting: Physician Assistant

## 2023-10-25 ENCOUNTER — Encounter (HOSPITAL_COMMUNITY): Payer: Self-pay | Admitting: Urology

## 2023-10-25 ENCOUNTER — Ambulatory Visit (HOSPITAL_COMMUNITY)
Admission: RE | Admit: 2023-10-25 | Discharge: 2023-10-25 | Disposition: A | Discharge status: Home / Self Care | Attending: Urology | Admitting: Urology

## 2023-10-25 ENCOUNTER — Encounter (HOSPITAL_COMMUNITY)
Admission: RE | Disposition: A | Discharge status: Home / Self Care | Payer: Self-pay | Source: Home / Self Care | Attending: Urology

## 2023-10-25 DIAGNOSIS — I10 Essential (primary) hypertension: Secondary | ICD-10-CM | POA: Diagnosis not present

## 2023-10-25 DIAGNOSIS — Z955 Presence of coronary angioplasty implant and graft: Secondary | ICD-10-CM | POA: Diagnosis not present

## 2023-10-25 DIAGNOSIS — Z79899 Other long term (current) drug therapy: Secondary | ICD-10-CM | POA: Insufficient documentation

## 2023-10-25 DIAGNOSIS — N202 Calculus of kidney with calculus of ureter: Secondary | ICD-10-CM | POA: Diagnosis not present

## 2023-10-25 DIAGNOSIS — J449 Chronic obstructive pulmonary disease, unspecified: Secondary | ICD-10-CM | POA: Diagnosis not present

## 2023-10-25 DIAGNOSIS — I2510 Atherosclerotic heart disease of native coronary artery without angina pectoris: Secondary | ICD-10-CM | POA: Insufficient documentation

## 2023-10-25 DIAGNOSIS — N132 Hydronephrosis with renal and ureteral calculous obstruction: Secondary | ICD-10-CM | POA: Diagnosis not present

## 2023-10-25 DIAGNOSIS — F17210 Nicotine dependence, cigarettes, uncomplicated: Secondary | ICD-10-CM | POA: Diagnosis not present

## 2023-10-25 DIAGNOSIS — N201 Calculus of ureter: Secondary | ICD-10-CM | POA: Diagnosis not present

## 2023-10-25 DIAGNOSIS — I739 Peripheral vascular disease, unspecified: Secondary | ICD-10-CM | POA: Diagnosis not present

## 2023-10-25 HISTORY — PX: CYSTOSCOPY/URETEROSCOPY/HOLMIUM LASER/STENT PLACEMENT: SHX6546

## 2023-10-25 SURGERY — CYSTOSCOPY/URETEROSCOPY/HOLMIUM LASER/STENT PLACEMENT
Anesthesia: General | Site: Pelvis | Laterality: Left

## 2023-10-25 MED ORDER — ONDANSETRON HCL 4 MG/2ML IJ SOLN
INTRAMUSCULAR | Status: DC | PRN
  Administered 2023-10-25: 4 mg via INTRAVENOUS

## 2023-10-25 MED ORDER — MIDAZOLAM HCL 2 MG/2ML IJ SOLN
INTRAMUSCULAR | Status: AC
  Filled 2023-10-25: qty 2

## 2023-10-25 MED ORDER — TRAMADOL HCL 50 MG PO TABS: 50.0000 mg | ORAL_TABLET | Freq: Four times a day (QID) | ORAL | 0 refills | Status: AC | PRN

## 2023-10-25 MED ORDER — PROPOFOL 10 MG/ML IV BOLUS
INTRAVENOUS | Status: AC
  Filled 2023-10-25: qty 20

## 2023-10-25 MED ORDER — PHENYLEPHRINE 80 MCG/ML (10ML) SYRINGE FOR IV PUSH (FOR BLOOD PRESSURE SUPPORT)
PREFILLED_SYRINGE | INTRAVENOUS | Status: DC | PRN
  Administered 2023-10-25: 160 ug via INTRAVENOUS
  Administered 2023-10-25: 80 ug via INTRAVENOUS

## 2023-10-25 MED ORDER — CEFAZOLIN SODIUM-DEXTROSE 3-4 GM/150ML-% IV SOLN
INTRAVENOUS | Status: AC
  Filled 2023-10-25: qty 150

## 2023-10-25 MED ORDER — DEXAMETHASONE SODIUM PHOSPHATE 10 MG/ML IJ SOLN
INTRAMUSCULAR | Status: DC | PRN
  Administered 2023-10-25: 10 mg via INTRAVENOUS

## 2023-10-25 MED ORDER — CELECOXIB 200 MG PO CAPS: 200.0000 mg | ORAL_CAPSULE | Freq: Two times a day (BID) | ORAL | 1 refills | Status: AC

## 2023-10-25 MED ORDER — CHLORHEXIDINE GLUCONATE 0.12 % MT SOLN
15.0000 mL | Freq: Once | OROMUCOSAL | Status: AC
Start: 2023-10-25 — End: 2023-10-25
  Administered 2023-10-25: 15 mL via OROMUCOSAL

## 2023-10-25 MED ORDER — SODIUM CHLORIDE 0.9 % IR SOLN
Status: DC | PRN
  Administered 2023-10-25: 3000 mL via INTRAVESICAL

## 2023-10-25 MED ORDER — ACETAMINOPHEN 10 MG/ML IV SOLN: 1000.0000 mg | Freq: Once | INTRAVENOUS | Status: DC | PRN

## 2023-10-25 MED ORDER — MIDAZOLAM HCL 2 MG/2ML IJ SOLN
INTRAMUSCULAR | Status: DC | PRN
  Administered 2023-10-25: 1 mg via INTRAVENOUS

## 2023-10-25 MED ORDER — DEXTROSE 5 % IV SOLN
INTRAVENOUS | Status: DC | PRN
  Administered 2023-10-25: 3 g via INTRAVENOUS

## 2023-10-25 MED ORDER — LIDOCAINE HCL (PF) 2 % IJ SOLN
INTRAMUSCULAR | Status: DC | PRN
  Administered 2023-10-25: 100 mg via INTRADERMAL

## 2023-10-25 MED ORDER — LACTATED RINGERS IV SOLN: INTRAVENOUS | Status: DC

## 2023-10-25 MED ORDER — FENTANYL CITRATE (PF) 100 MCG/2ML IJ SOLN
INTRAMUSCULAR | Status: AC
Start: 2023-10-25 — End: 2023-10-25
  Filled 2023-10-25: qty 2

## 2023-10-25 MED ORDER — FENTANYL CITRATE PF 50 MCG/ML IJ SOSY: 25.0000 ug | PREFILLED_SYRINGE | INTRAMUSCULAR | Status: DC | PRN

## 2023-10-25 MED ORDER — ORAL CARE MOUTH RINSE: 15.0000 mL | Freq: Once | OROMUCOSAL | Status: AC

## 2023-10-25 MED ORDER — EPHEDRINE SULFATE-NACL 50-0.9 MG/10ML-% IV SOSY
PREFILLED_SYRINGE | INTRAVENOUS | Status: DC | PRN
  Administered 2023-10-25 (×4): 5 mg via INTRAVENOUS

## 2023-10-25 MED ORDER — FENTANYL CITRATE (PF) 250 MCG/5ML IJ SOLN
INTRAMUSCULAR | Status: DC | PRN
  Administered 2023-10-25: 50 ug via INTRAVENOUS

## 2023-10-25 MED ORDER — IOHEXOL 300 MG/ML  SOLN
INTRAMUSCULAR | Status: DC | PRN
Start: 2023-10-25 — End: 2023-10-25
  Administered 2023-10-25: 10 mL

## 2023-10-25 MED ORDER — PROPOFOL 10 MG/ML IV BOLUS
INTRAVENOUS | Status: DC | PRN
  Administered 2023-10-25: 200 mg via INTRAVENOUS

## 2023-10-25 SURGICAL SUPPLY — 19 items
BAG COUNTER SPONGE SURGICOUNT (BAG) IMPLANT
BAG URO CATCHER STRL LF (MISCELLANEOUS) ×1 IMPLANT
BASKET ZERO TIP NITINOL 2.4FR (BASKET) IMPLANT
CATH URETERAL DUAL LUMEN 10F (MISCELLANEOUS) ×1 IMPLANT
CATH URETL OPEN END 6FR 70 (CATHETERS) IMPLANT
CLOTH BEACON ORANGE TIMEOUT ST (SAFETY) ×1 IMPLANT
GLOVE SS BIOGEL STRL SZ 7 (GLOVE) ×1 IMPLANT
GOWN STRL REUS W/ TWL XL LVL3 (GOWN DISPOSABLE) ×1 IMPLANT
GUIDEWIRE STR DUAL SENSOR (WIRE) IMPLANT
GUIDEWIRE ZIPWRE .038 STRAIGHT (WIRE) ×1 IMPLANT
IV NS 1000ML BAXH (IV SOLUTION) ×1 IMPLANT
KIT TURNOVER KIT A (KITS) IMPLANT
MANIFOLD NEPTUNE II (INSTRUMENTS) ×1 IMPLANT
PACK CYSTO (CUSTOM PROCEDURE TRAY) ×1 IMPLANT
SHEATH NAVIGATOR HD 11/13X36 (SHEATH) IMPLANT
STENT URET 6FRX26 CONTOUR (STENTS) IMPLANT
TRACTIP FLEXIVA PULS ID 200XHI (Laser) IMPLANT
TUBING CONNECTING 10 (TUBING) ×1 IMPLANT
TUBING UROLOGY SET (TUBING) ×1 IMPLANT

## 2023-10-25 NOTE — Telephone Encounter (Signed)
Second attempt; no answer.

## 2023-10-25 NOTE — Telephone Encounter (Signed)
 FYI Only or Action Required?: FYI only for provider  Patient was last seen in primary care on 10/06/2023 by Yevette Hem, FNP. Called Nurse Triage reporting Leg Swelling. Symptoms began several days ago. Interventions attempted: Rest, hydration, or home remedies. Symptoms are: gradually worsening.  Triage Disposition: See Physician Within 24 Hours  Patient/caregiver understands and will follow disposition?: Yes     Reason for Disposition  [1] MODERATE leg swelling (e.g., swelling extends up to knees) AND [2] new-onset or worsening  Answer Assessment - Initial Assessment Questions 1. ONSET: When did the swelling start? (e.g., minutes, hours, days)     Swelling started 3-4 days ago, seeping started yesterday  2. LOCATION: What part of the leg is swollen?  Are both legs swollen or just one leg?     Both feet are swelling, but right foot and leg is worse  3. SEVERITY: How bad is the swelling? (e.g., localized; mild, moderate, severe)   - Localized: Small area of swelling localized to one leg.   - MILD pedal edema: Swelling limited to foot and ankle, pitting edema < 1/4 inch (6 mm) deep, rest and elevation eliminate most or all swelling.   - MODERATE edema: Swelling of lower leg to knee, pitting edema > 1/4 inch (6 mm) deep, rest and elevation only partially reduce swelling.   - SEVERE edema: Swelling extends above knee, facial or hand swelling present.      Moderate swelling- swelling better when elevated  4. REDNESS: Does the swelling look red or infected?     No  5. PAIN: Is the swelling painful to touch? If Yes, ask: How painful is it?   (Scale 1-10; mild, moderate or severe)     No  6. FEVER: Do you have a fever? If Yes, ask: What is it, how was it measured, and when did it start?      No  7. CAUSE: What do you think is causing the leg swelling?     Uncause  8. MEDICAL HISTORY: Do you have a history of blood clots (e.g., DVT), cancer, heart failure,  kidney disease, or liver failure?     Had heart stent 3 years ago, colon cancer surgery last year, and chronic kidney stones (had 8mm) removed today. Has been on abx for sepsis for 2-3 weeks.   9. RECURRENT SYMPTOM: Have you had leg swelling before? If Yes, ask: When was the last time? What happened that time?     No  10. OTHER SYMPTOMS: Do you have any other symptoms? (e.g., chest pain, difficulty breathing)       No    Went CV doctor and was advised to stay on abx for a prolonged surgery.  Protocols used: Leg Swelling and Edema-A-AH

## 2023-10-25 NOTE — Anesthesia Procedure Notes (Signed)
 Procedure Name: LMA Insertion Date/Time: 10/25/2023 8:33 AM  Performed by: Virgil Griffiths, CRNAPre-anesthesia Checklist: Patient identified, Emergency Drugs available, Suction available and Patient being monitored Patient Re-evaluated:Patient Re-evaluated prior to induction Oxygen Delivery Method: Circle System Utilized Preoxygenation: Pre-oxygenation with 100% oxygen Induction Type: IV induction Ventilation: Mask ventilation without difficulty LMA: LMA inserted LMA Size: 4.0 Number of attempts: 1 Airway Equipment and Method: Bite block Placement Confirmation: positive ETCO2 Tube secured with: Tape Dental Injury: Teeth and Oropharynx as per pre-operative assessment

## 2023-10-25 NOTE — Telephone Encounter (Signed)
 Attempt X3 to call patient back, regarding s/s they called for.  No response noted.  Left a discrete VM appropriately.    Message routed.

## 2023-10-25 NOTE — Discharge Instructions (Addendum)
[facility] ?[phone] ?Post Ureteroscopy With or Without Stent Instructions ? ?*remove stent by pulling string on Monday* ? ?Definitions: ? ?Ureter: The duct that transports urine from the kidney to the bladder. ?Stent:   A plastic hollow tube that is placed into the ureter, from the kidney to the bladder to prevent the ureter from swelling shut. ? ?GENERAL INSTRUCTIONS: ? ?Despite the fact that no skin incisions were used, the area around the ureter and bladder is raw and irritated. The stent is a foreign body which will further irritate the bladder wall. This irritation is manifested by increased frequency of urination, both day and night, and by an increase in the urge to urinate. In some, the urge to urinate is present almost always. Sometimes the urge is strong enough that you may not be able to stop yourself from urinating. The only real cure is to remove the stent and then give time for the bladder wall to heal which can't be done until the danger of the ureter swelling shut has passed, which varies. ? ?You may see some blood in your urine while the stent is in place and a few days afterwards. Do not be alarmed, even if the urine was clear for a while. Get off your feet and drink lots of fluids until clearing occurs. If you start to pass clots or don't improve, call us. ? ?DIET: ?You may return to your normal diet immediately. Because of the raw surface of your bladder, alcohol, spicy foods, acid type foods and drinks with caffeine may cause irritation or frequency and should be used in moderation. To keep your urine flowing freely and to avoid constipation, drink plenty of fluids during the day ( 8-10 glasses ). ?Tip: Avoid cranberry juice because it is very acidic. ? ?ACTIVITY: ?Your physical activity doesn't need to be restricted. However, if you are very active, you may see some blood in your urine. We suggest that you reduce your activity under these circumstances until the bleeding  has stopped. ? ?BOWELS: ?It is important to keep your bowels regular during the postoperative period. Straining with bowel movements can cause bleeding. A bowel movement every other day is reasonable. Use a mild laxative if needed, such as Milk of Magnesia 2-3 tablespoons, or 2 Dulcolax tablets. Call if you continue to have problems. If you have been taking narcotics for pain, before, during or after your surgery, you may be constipated. Take a laxative if necessary. ? ? ?MEDICATION: ?You should resume your pre-surgery medications unless told not to. In addition you will often be given an antibiotic to prevent infection and likely several as needed medications for stent related discomfort. These should be taken as prescribed until the bottles are finished unless you are having an unusual reaction to one of the drugs. ? ?PROBLEMS YOU SHOULD REPORT TO [place]: ?Fevers over 100.5 Fahrenheit. ?Heavy bleeding, or clots ( See above notes about blood in urine ). ?Inability to urinate. ?Drug reactions ( hives, rash, nausea, vomiting, diarrhea ). ?Severe burning or pain with urination that is not improving. ? ? ? ? ? ?

## 2023-10-25 NOTE — Transfer of Care (Signed)
 Immediate Anesthesia Transfer of Care Note  Patient: Vincent Griffin  Procedure(s) Performed: CYSTOSCOPY/URETEROSCOPY/HOLMIUM LASER/STENT PLACEMENT (Left: Pelvis)  Patient Location: PACU  Anesthesia Type:General  Level of Consciousness: drowsy and patient cooperative  Airway & Oxygen Therapy: Patient Spontanous Breathing and Patient connected to nasal cannula oxygen  Post-op Assessment: Report given to RN and Post -op Vital signs reviewed and stable  Post vital signs: Reviewed and stable  Last Vitals:  Vitals Value Taken Time  BP 111/88 10/25/23 0951  Temp    Pulse 62 10/25/23 0954  Resp 15 10/25/23 0954  SpO2 93 % 10/25/23 0954  Vitals shown include unfiled device data.  Last Pain:  Vitals:   10/25/23 0627  TempSrc:   PainSc: 3       Patients Stated Pain Goal: 0 (10/25/23 4098)  Complications: No notable events documented.

## 2023-10-25 NOTE — H&P (Signed)
 H&P  History of Present Illness: Vincent Griffin is a 63 y.o. year old M who presents today for treatment of a left ureteral stone. He is s/p stent placement   No acute complaints  Past Medical History:  Diagnosis Date   Anxiety    CAD (coronary artery disease)    Cancer (HCC)    colon   COPD (chronic obstructive pulmonary disease) (HCC)    Depression    GERD (gastroesophageal reflux disease)    History of kidney stones    Hypercholesterolemia    Hypertension    Iliac artery aneurysm, right (HCC)    Myocardial infarction (HCC) 2022   Peripheral vascular disease (HCC)    Pneumonia     Past Surgical History:  Procedure Laterality Date   ABDOMINAL AORTIC ENDOVASCULAR STENT GRAFT N/A 09/08/2023   Procedure: INSERTION, ENDOVASCULAR STENT GRAFT, AORTA, ABDOMINAL;  Surgeon: [name], MD;  Location: [facility] OR;  Service: Vascular;  Laterality: N/A;   ANEURYSM COILING Left 09/08/2023   Procedure: EMBOLIZATION, USING COIL, OF RIGHT INTERNAL ILIAC;  Surgeon: [name], MD;  Location: [facility] OR;  Service: Vascular;  Laterality: Left;   APPENDECTOMY     BIOPSY  09/08/2022   Procedure: BIOPSY;  Surgeon: Vinetta Greening, DO;  Location: [facility];  Service: Endoscopy;;   BIOPSY  04/10/2023   Procedure: BIOPSY;  Surgeon: Vinetta Greening, DO;  Location: [facility];  Service: Endoscopy;;   CARDIAC CATHETERIZATION  07/2020   with stent.   CATARACT EXTRACTION     CATARACT EXTRACTION W/PHACO Left 01/07/2016   Procedure: CATARACT EXTRACTION PHACO AND INTRAOCULAR LENS PLACEMENT LEFT EYE CDE=13.32;  Surgeon: [name], MD;  Location: [facility];  Service: Ophthalmology;  Laterality: Left;   COLONOSCOPY WITH PROPOFOL  N/A 09/08/2022   Procedure: COLONOSCOPY WITH PROPOFOL ;  Surgeon: Vinetta Greening, DO;  [facility]: [facility];  Service: Endoscopy;  Laterality: N/A;  11:30 am   COLONOSCOPY WITH PROPOFOL  N/A 04/10/2023   Procedure: COLONOSCOPY WITH PROPOFOL ;  Surgeon: Vinetta Greening, DO;  Location: [facility];  Service: Endoscopy;  Laterality: N/A;  100pm, asa 3   CYSTOSCOPY W/ URETERAL STENT PLACEMENT Left 10/01/2023   Procedure: CYSTOSCOPY, WITH RETROGRADE PYELOGRAM AND URETERAL STENT INSERTION;  Surgeon: Mallie Seal, MD;  Location: [facility];  Service: Urology;  Laterality: Left;   POLYPECTOMY  09/08/2022   Procedure: POLYPECTOMY;  Surgeon: Vinetta Greening, DO;  [facility];  Service: Endoscopy;;   POLYPECTOMY  04/10/2023   Procedure: POLYPECTOMY;  Surgeon: Vinetta Greening, DO;  [facility];  Service: Endoscopy;;   ROBOT ASSISTED LAPAROSCOPIC PARTIAL COLECTOMY  10/07/2022   SMALL INTESTINE SURGERY  1972   SUBMUCOSAL TATTOO INJECTION  09/08/2022   Procedure: SUBMUCOSAL TATTOO INJECTION;  Surgeon: Vinetta Greening, DO;  Location: [facility];  Service: Endoscopy;;    Home Medications:  Current Facility-Administered Medications for the 10/25/23 encounter [facility] Encounter)  Medication   cyanocobalamin  (VITAMIN B12) injection 1,000 mcg   Current Meds  Medication Sig   albuterol  (VENTOLIN  HFA) 108 (90 Base) MCG/ACT inhaler Inhale 2 puffs into the lungs every 6 (six) hours as needed for wheezing or shortness of breath.   aspirin  81 MG chewable tablet Chew 81 mg by mouth in the morning.   atorvastatin  (LIPITOR) 80 MG tablet Take 80 mg by mouth at bedtime.   budesonide -glycopyrrolate -formoterol  (BREZTRI  AEROSPHERE) 160-9-4.8 MCG/ACT AERO inhaler Inhale 2 puffs into the lungs 2 (two) times  daily. (Patient taking differently: Inhale 2 puffs into the lungs 2 (two) times daily as needed (SOB / Wheezing).)   cefadroxil  (DURICEF) 500 MG capsule Take 2 capsules (1,000 mg total) by mouth 2 (two) times daily.   ketorolac  (TORADOL ) 10 MG tablet Take 10 mg by mouth every 6 (six) hours as needed for severe pain (pain score 7-10).   metoprolol  succinate (TOPROL -XL) 25 MG 24 hr tablet Take 25 mg by mouth in the morning.    oxyCODONE -acetaminophen  (PERCOCET) 5-325 MG tablet Take 1 tablet by mouth every 6 (six) hours as needed for severe pain (pain score 7-10).    Allergies:  Allergies  Allergen Reactions   Zetia [Ezetimibe] Diarrhea and Nausea Only    Upset stomach    Family History  Problem Relation Age of Onset   Diabetes Mother    Stroke Mother    Colon polyps Brother    Colon cancer Neg Hx     Social History:  reports that he has been smoking cigarettes. He has never used smokeless tobacco. He reports current alcohol use of about 2.0 standard drinks of alcohol per week. He reports that he does not use drugs.  ROS: A complete review of systems was performed.  All systems are negative except for pertinent findings as noted.  Physical Exam:  Vital signs in last 24 hours: Temp:  [98.1 F (36.7 C)] 98.1 F (36.7 C) (06/11 0624) Pulse Rate:  [67] 67 (06/11 0624) Resp:  [15] 15 (06/11 0624) BP: (137)/(92) 137/92 (06/11 0624) SpO2:  [94 %] 94 % (06/11 0624) Constitutional:  Alert and oriented, No acute distress Cardiovascular: Regular rate and rhythm Respiratory: Normal respiratory effort, Lungs clear bilaterally GI: Abdomen is soft, nontender, nondistended, no abdominal masses Lymphatic: No lymphadenopathy Neurologic: Grossly intact, no focal deficits Psychiatric: Normal mood and affect   Laboratory Data:  No results for input(s): WBC, HGB, HCT, PLT in the last 72 hours.  No results for input(s): NA, K, CL, GLUCOSE, BUN, CALCIUM , CREATININE in the last 72 hours.  Invalid input(s): CO3   No results found for this or any previous visit (from the past 24 hours). No results found for this or any previous visit (from the past 240 hours).  Renal Function: No results for input(s): CREATININE in the last 168 hours. Estimated Creatinine Clearance: 112.2 mL/min (by C-G formula based on SCr of 0.99 mg/dL).  Radiologic Imaging: No results found.  Assessment:  Vincent Griffin is a 63 y.o. year old M with left ureteral stone, s/p stent placement  Plan:  --to OR as planned for L ureteroscopy with laser litho, stent. Procedure and risks reviewed, including but not limited to hematuria, infection, sepsis, damage to GU tract, failure to complete procedure, retained stone fragments, need for future procedures, stent pain, prolonged stent.   Julene Oaks, MD 10/25/2023, 8:21 AM  [facility] Pager: [phone]

## 2023-10-25 NOTE — Anesthesia Postprocedure Evaluation (Signed)
 Anesthesia Post Note  Patient: [name]  Procedure(s) Performed: CYSTOSCOPY/URETEROSCOPY/HOLMIUM LASER/STENT PLACEMENT (Left: Pelvis)     Patient location during evaluation: PACU Anesthesia Type: General Level of consciousness: awake and alert Pain management: pain level controlled Vital Signs Assessment: post-procedure vital signs reviewed and stable Respiratory status: spontaneous breathing, nonlabored ventilation, respiratory function stable and patient connected to nasal cannula oxygen Cardiovascular status: blood pressure returned to baseline and stable Postop Assessment: no apparent nausea or vomiting Anesthetic complications: no   No notable events documented.  Last Vitals:  Vitals:   10/25/23 1015 10/25/23 1030  BP: 117/77 132/89  Pulse: 61 61  Resp: 13 14  Temp:  36.6 C  SpO2: 94% 93%    Last Pain:  Vitals:   10/25/23 1030  TempSrc: Oral  PainSc: 0-No pain                 Theotis Flake P [place]

## 2023-10-25 NOTE — Op Note (Signed)
 Operative Note  Preoperative diagnosis:  1.  Left ureteral and renal stones  Postoperative diagnosis: 1.  same  Procedure(s): 1.  Left ureteroscopy with laser lithotripsy of stones 2. Left retrograde pyelogram 3. Left ureteral stent placement  Surgeon: Julene Oaks, MD  Assistants:  None  Anesthesia:  General  Complications:  None  EBL: Minimal  Specimens: 1. none  Drains/Catheters: 1.  Left 6Fr x 26cm ureteral stent with tether string  Intraoperative findings:   Cystoscopy demonstrated some bilobar enlargement of prostate Left Ureteroscopy demonstrated stone in proximal ureter and another in a lower pole; these were fragmented completely Left Successful stent placement.  Left Retrograde Pyelogram: mild dilation of entire left collecting system. Stone was visible on KUB pre contrast   Description of procedure: After informed consent was obtained from the patient, the patient was identified and taken to the operating room and placed in the supine position.  General anesthesia was administered as well as perioperative IV antibiotics.  At the beginning of the case, a time-out was performed to properly identify the patient, the surgery to be performed, and the surgical site.  Sequential compression devices were applied to the lower extremities at the beginning of the case for DVT prophylaxis.  The patient was then placed in the dorsal lithotomy supine position, prepped and draped in sterile fashion.  We then passed the 21-French rigid cystoscope through the urethra and into the bladder under vision without any difficulty, noting a mildly obstructing prostate.  A systematic evaluation of the bladder revealed no evidence of any suspicious bladder lesions.  Ureteral orifices were in normal position.    The distal aspect of the ureteral stent was seen protruding from the left ureteral orifice.  We then used the alligator-tooth forceps and grasped the distal end of the ureteral stent  and brought it out the urethral meatus while watching the proximal coil straighten out nicely on fluoroscopy. Through the ureteral stent, we then passed a 0.038 glide wire up to the level of the renal pelvis.  The ureteral stent was then removed, leaving the glide wire up the left ureter.  The cystoscope was withdrawn, and a dual lumen catheter was inserted over the glide wire into the distal ureter. A gentle retrograde pyelogram was performed, revealing a normal caliber ureter without any filling defects. There was mild hydronephrosis of the collecting system. There was a filling defect in the proximal ureter corresponding to the stone. A 0.038 sensor wire was then passed up to the level of the renal pelvis and secured to the drape as a safety wire. The dual lumen was removed.  The flexible ureteroscope was advanced into the collecting system via the sensor wire. The stone was encountered in the proximal ureter. It was partially fragmented, and the fragments were then gently pushed into the collecting system. The collecting system was inspected. The calculus fragments were pushed into the upper pole where they were dusted with the laser. Another stone was identified in the lower pole and it was dusted as well . With the ureteroscope in the kidney, a gentle pyelogram was performed to delineate the calyceal system and we evaluated the calyces systematically. We encountered no further fragments >2 mm. The rest of the stone fragments were very tiny and these were  irrigated away gently. The calyces were re-inspected and there were no significant stone fragment residual.   We then withdrew the ureteroscope back down the ureter along with the access sheath, noting no evidence of any stones along  the course of the ureter.  Prior to removing the ureteroscope, we did pass the Glidewire back up to the ureter to the renal pelvis.  Once the ureteroscope was removed, we then used the Glidewire under fluoroscopic guidance  and passed up a 6-French x 26 cm double-pigtail ureteral stent up the ureter, making sure that the proximal and distal ends coiled within the kidney and bladder respectively.   Note that we left a tether string attached to the distal end of the ureteral stent and it exited the urethral meatus with a short tether.  The cystoscope was then advanced back into the bladder under vision.  We were able to see the distal stent coiling nicely within the bladder.  The bladder was then emptied with irrigation solution.  The cystoscope was then removed.    The patient tolerated the procedure well and there was no complication. Patient was awoken from anesthesia and taken to the recovery room in stable condition. I was present and scrubbed for the entirety of the case.  Plan:  Patient will be discharged home and may remove stent in 5 days   G. Julene Oaks MD [facility]  Pager: [phone]

## 2023-10-25 NOTE — Telephone Encounter (Signed)
  This RN attempted to contact patient for triage. No answer, voicemail left requesting return call to clinic.   Copied from CRM [id number]. Topic: Clinical - Red Word Triage >> Oct 25, 2023  2:49 PM Vincent Griffin wrote: Red Word that prompted transfer to Nurse Triage: Patient called in with complaints of right foot and leg swelling with clear fluid leaking from the leg. Leg also has blisters on it. In addition, he also states that the right leg has some swelling but not like the right leg.

## 2023-10-26 ENCOUNTER — Encounter (HOSPITAL_COMMUNITY): Payer: Self-pay | Admitting: Urology

## 2023-10-26 ENCOUNTER — Ambulatory Visit: Admitting: Nurse Practitioner

## 2023-10-26 VITALS — BP 113/71 | HR 73 | Temp 98.0°F | Ht 70.0 in | Wt 316.0 lb

## 2023-10-26 DIAGNOSIS — R600 Localized edema: Secondary | ICD-10-CM

## 2023-10-26 MED ORDER — FUROSEMIDE 20 MG PO TABS: 20.0000 mg | ORAL_TABLET | Freq: Every day | ORAL | 3 refills | Status: DC

## 2023-10-26 NOTE — Patient Instructions (Signed)
 Peripheral Edema  Peripheral edema is swelling that is caused by a buildup of fluid. Peripheral edema most often affects the lower legs, ankles, and feet. It can also develop in the arms, hands, and face. The area of the body that has peripheral edema will look swollen. It may also feel heavy or warm. Your clothes may start to feel tight. Pressing on the area may make a temporary dent in your skin (pitting edema). You may not be able to move your swollen arm or leg as much as usual. There are many causes of peripheral edema. It can happen because of a complication of other conditions such as heart failure, kidney disease, or a problem with your circulation. It also can be a side effect of certain medicines or happen because of an infection. It often happens to women during pregnancy. Sometimes, the cause is not known. Follow these instructions at home: Managing pain, stiffness, and swelling  Raise (elevate) your legs while you are sitting or lying down. Move around often to prevent stiffness and to reduce swelling. Do not sit or stand for long periods of time. Do not wear tight clothing. Do not wear garters on your upper legs. Exercise your legs to get your circulation going. This helps to move the fluid back into your blood vessels, and it may help the swelling go down. Wear compression stockings as told by your health care provider. These stockings help to prevent blood clots and reduce swelling in your legs. It is important that these are the correct size. These stockings should be prescribed by your doctor to prevent possible injuries. If elastic bandages or wraps are recommended, use them as told by your health care provider. Medicines Take over-the-counter and prescription medicines only as told by your health care provider. Your health care provider may prescribe medicine to help your body get rid of excess water (diuretic). Take this medicine if you are told to take it. General  instructions Eat a low-salt (low-sodium) diet as told by your health care provider. Sometimes, eating less salt may reduce swelling. Pay attention to any changes in your symptoms. Moisturize your skin daily to help prevent skin from cracking and draining. Keep all follow-up visits. This is important. Contact a health care provider if: You have a fever. You have swelling in only one leg. You have increased swelling, redness, or pain in one or both of your legs. You have drainage or sores at the area where you have edema. Get help right away if: You have edema that starts suddenly or is getting worse, especially if you are pregnant or have a medical condition. You develop shortness of breath, especially when you are lying down. You have pain in your chest or abdomen. You feel weak. You feel like you will faint. These symptoms may be an emergency. Get help right away. Call 911. Do not wait to see if the symptoms will go away. Do not drive yourself to the hospital. Summary Peripheral edema is swelling that is caused by a buildup of fluid. Peripheral edema most often affects the lower legs, ankles, and feet. Move around often to prevent stiffness and to reduce swelling. Do not sit or stand for long periods of time. Pay attention to any changes in your symptoms. Contact a health care provider if you have edema that starts suddenly or is getting worse, especially if you are pregnant or have a medical condition. Get help right away if you develop shortness of breath, especially when lying down.  This information is not intended to replace advice given to you by your health care provider. Make sure you discuss any questions you have with your health care provider. Document Revised: 01/04/2021 Document Reviewed: 01/04/2021 Elsevier Patient Education  2024 [employer].

## 2023-10-26 NOTE — Telephone Encounter (Signed)
 Pt has appt in office today, will close encounter.

## 2023-10-26 NOTE — Telephone Encounter (Signed)
 E2C2 scheduled appointment.

## 2023-10-26 NOTE — Telephone Encounter (Signed)
 Patient in clinic today to address condition. Patient is being seen by Mary-Margaret Gaylyn Keas.

## 2023-10-26 NOTE — Progress Notes (Signed)
 Subjective:    Patient ID: Vincent Griffin, male    DOB: 1960-07-09, 63 y.o.   MRN: [id number]   Chief Complaint: bilateral legs red and swelling   HPI  Patient comes in c/o bil lower ext edema and erythema. Started several days ago. Now legs are draining clear fluid.   Patient Active Problem List   Diagnosis Date Noted   Lactic acidosis 10/01/2023   Nephrolithiasis-8 mm stone in proximal left ureter with obstructive change. 10/01/2023   Acute respiratory failure with hypoxia (HCC) 10/01/2023   AKI (acute kidney injury) (HCC) 10/01/2023   Elevated MCV 10/01/2023   Obesity, Class III, BMI 40-49.9 (morbid obesity) 10/01/2023   Severe sepsis (HCC) 10/01/2023   Severe GNR Sepsis secondary to UTI (HCC) 09/30/2023   Iliac artery aneurysm (HCC) 09/08/2023   Aneurysm artery, iliac (HCC) 07/18/2023   COPD (chronic obstructive pulmonary disease) (HCC) 07/18/2023   Prolapsed internal hemorrhoids, grade 2 05/11/2023   Cecal cancer (HCC) 11/02/2022   S/P laparoscopic colectomy 10/07/2022   Rectal bleeding 06/01/2021   H/O non-ST elevation myocardial infarction (NSTEMI) 04/01/2021   CAD (coronary artery disease) 08/13/2020   Hyperlipemia 06/08/2020   Tobacco abuse 07/19/2016       Review of Systems  Constitutional:  Negative for diaphoresis.  Eyes:  Negative for pain.  Respiratory:  Positive for shortness of breath (normal for him).   Cardiovascular:  Positive for leg swelling. Negative for chest pain and palpitations.  Gastrointestinal:  Negative for abdominal pain.  Endocrine: Negative for polydipsia.  Skin:  Negative for rash.  Neurological:  Negative for dizziness, weakness and headaches.  Hematological:  Does not bruise/bleed easily.  All other systems reviewed and are negative.      Objective:   Physical Exam Constitutional:      Appearance: Normal appearance. He is obese.   Cardiovascular:     Rate and Rhythm: Normal rate and regular rhythm.     Heart sounds: Normal  heart sounds.  Pulmonary:     Breath sounds: Wheezing (insp and exp wheezes throughout) present.   Musculoskeletal:     Right lower leg: Edema (3+) present.     Left lower leg: Left lower leg edema: 3+.   Skin:    General: Skin is warm.   Neurological:     General: No focal deficit present.     Mental Status: He is alert and oriented to person, place, and time.   Psychiatric:        Mood and Affect: Mood normal.        Behavior: Behavior normal.    BP 113/71   Pulse 73   Temp 98 F (36.7 C) (Temporal)   Ht 5' 10 (1.778 m)   Wt (!) 316 lb (143.3 kg)   SpO2 94%   BMI 45.34 kg/m         Assessment & Plan:   Vincent Griffin in today with chief complaint of bilateral legs red and swelling   1. Peripheral edema (Primary) Elevate legs when sitting RTO Monday to recheck legs - Apply unna boot - furosemide (LASIX) 20 MG tablet; Take 1 tablet (20 mg total) by mouth daily.  Dispense: 30 tablet; Refill: 3    The above assessment and management plan was discussed with the patient. The patient verbalized understanding of and has agreed to the management plan. Patient is aware to call the clinic if symptoms persist or worsen. Patient is aware when to return to the clinic for a  follow-up visit. Patient educated on when it is appropriate to go to the emergency department.   Mary-Margaret Gaylyn Keas, FNP

## 2023-10-30 ENCOUNTER — Ambulatory Visit: Admitting: Nurse Practitioner

## 2023-10-30 ENCOUNTER — Encounter: Payer: Self-pay | Admitting: Nurse Practitioner

## 2023-10-30 VITALS — BP 118/81 | HR 92 | Temp 97.1°F | Ht 70.0 in | Wt 308.0 lb

## 2023-10-30 DIAGNOSIS — R600 Localized edema: Secondary | ICD-10-CM

## 2023-10-30 NOTE — Patient Instructions (Signed)
 Peripheral Edema  Peripheral edema is swelling that is caused by a buildup of fluid. Peripheral edema most often affects the lower legs, ankles, and feet. It can also develop in the arms, hands, and face. The area of the body that has peripheral edema will look swollen. It may also feel heavy or warm. Your clothes may start to feel tight. Pressing on the area may make a temporary dent in your skin (pitting edema). You may not be able to move your swollen arm or leg as much as usual. There are many causes of peripheral edema. It can happen because of a complication of other conditions such as heart failure, kidney disease, or a problem with your circulation. It also can be a side effect of certain medicines or happen because of an infection. It often happens to women during pregnancy. Sometimes, the cause is not known. Follow these instructions at home: Managing pain, stiffness, and swelling  Raise (elevate) your legs while you are sitting or lying down. Move around often to prevent stiffness and to reduce swelling. Do not sit or stand for long periods of time. Do not wear tight clothing. Do not wear garters on your upper legs. Exercise your legs to get your circulation going. This helps to move the fluid back into your blood vessels, and it may help the swelling go down. Wear compression stockings as told by your health care provider. These stockings help to prevent blood clots and reduce swelling in your legs. It is important that these are the correct size. These stockings should be prescribed by your doctor to prevent possible injuries. If elastic bandages or wraps are recommended, use them as told by your health care provider. Medicines Take over-the-counter and prescription medicines only as told by your health care provider. Your health care provider may prescribe medicine to help your body get rid of excess water (diuretic). Take this medicine if you are told to take it. General  instructions Eat a low-salt (low-sodium) diet as told by your health care provider. Sometimes, eating less salt may reduce swelling. Pay attention to any changes in your symptoms. Moisturize your skin daily to help prevent skin from cracking and draining. Keep all follow-up visits. This is important. Contact a health care provider if: You have a fever. You have swelling in only one leg. You have increased swelling, redness, or pain in one or both of your legs. You have drainage or sores at the area where you have edema. Get help right away if: You have edema that starts suddenly or is getting worse, especially if you are pregnant or have a medical condition. You develop shortness of breath, especially when you are lying down. You have pain in your chest or abdomen. You feel weak. You feel like you will faint. These symptoms may be an emergency. Get help right away. Call 911. Do not wait to see if the symptoms will go away. Do not drive yourself to the hospital. Summary Peripheral edema is swelling that is caused by a buildup of fluid. Peripheral edema most often affects the lower legs, ankles, and feet. Move around often to prevent stiffness and to reduce swelling. Do not sit or stand for long periods of time. Pay attention to any changes in your symptoms. Contact a health care provider if you have edema that starts suddenly or is getting worse, especially if you are pregnant or have a medical condition. Get help right away if you develop shortness of breath, especially when lying down.  This information is not intended to replace advice given to you by your health care provider. Make sure you discuss any questions you have with your health care provider. Document Revised: 01/04/2021 Document Reviewed: 01/04/2021 Elsevier Patient Education  2024 [employer].

## 2023-10-30 NOTE — Progress Notes (Signed)
 Subjective:    Patient ID: Vincent Griffin, male    DOB: 1960/08/08, 63 y.o.   MRN: [id number]   Chief Complaint: Recheck legs   HPI  Patient was seen on 10/26/23 with peripheral edema. Bil unna boots were applied. We put him on lasix  and told him to elevate legs while sitting. He is here today to have unna boots removed and to recheck legs. Weight is down 8lbs.  Wt Readings from Last 3 Encounters:  10/30/23 (!) 308 lb (139.7 kg)  10/26/23 (!) 316 lb (143.3 kg)  10/23/23 (!) 310 lb (140.6 kg)    Patient Active Problem List   Diagnosis Date Noted   Lactic acidosis 10/01/2023   Nephrolithiasis-8 mm stone in proximal left ureter with obstructive change. 10/01/2023   Acute respiratory failure with hypoxia (HCC) 10/01/2023   AKI (acute kidney injury) (HCC) 10/01/2023   Elevated MCV 10/01/2023   Obesity, Class III, BMI 40-49.9 (morbid obesity) 10/01/2023   Severe sepsis (HCC) 10/01/2023   Severe GNR Sepsis secondary to UTI (HCC) 09/30/2023   Iliac artery aneurysm (HCC) 09/08/2023   Aneurysm artery, iliac (HCC) 07/18/2023   COPD (chronic obstructive pulmonary disease) (HCC) 07/18/2023   Prolapsed internal hemorrhoids, grade 2 05/11/2023   Cecal cancer (HCC) 11/02/2022   S/P laparoscopic colectomy 10/07/2022   Rectal bleeding 06/01/2021   H/O non-ST elevation myocardial infarction (NSTEMI) 04/01/2021   CAD (coronary artery disease) 08/13/2020   Hyperlipemia 06/08/2020   Tobacco abuse 07/19/2016       Review of Systems  Constitutional:  Negative for diaphoresis.  Eyes:  Negative for pain.  Respiratory:  Negative for shortness of breath.   Cardiovascular:  Negative for chest pain, palpitations and leg swelling.  Gastrointestinal:  Negative for abdominal pain.  Endocrine: Negative for polydipsia.  Skin:  Negative for rash.  Neurological:  Negative for dizziness, weakness and headaches.  Hematological:  Does not bruise/bleed easily.  All other systems reviewed and are  negative.      Objective:   Physical Exam Constitutional:      Appearance: Normal appearance.   Cardiovascular:     Rate and Rhythm: Normal rate and regular rhythm.     Heart sounds: Normal heart sounds.  Pulmonary:     Breath sounds: Normal breath sounds.   Musculoskeletal:     Right lower leg: No edema.     Left lower leg: No edema.   Skin:    General: Skin is warm.     Comments: No erythema of bil lower ext.   Neurological:     General: No focal deficit present.     Mental Status: He is alert and oriented to person, place, and time.   Psychiatric:        Mood and Affect: Mood normal.        Behavior: Behavior normal.    BP 118/81   Pulse 92   Temp (!) 97.1 F (36.2 C) (Temporal)   Ht 5' 10 (1.778 m)   Wt (!) 308 lb (139.7 kg)   SpO2 93%   BMI 44.19 kg/m         Assessment & Plan:   Vincent Griffin in today with chief complaint of Recheck legs   1. Peripheral edema (Primary) Compression socks- when working Elevate legs when sitting Weigh daily and if have 2 lb weight gain in 24 hours- then take a fluid pill. Follow up with PCP as scheduled    The above assessment and management plan was  discussed with the patient. The patient verbalized understanding of and has agreed to the management plan. Patient is aware to call the clinic if symptoms persist or worsen. Patient is aware when to return to the clinic for a follow-up visit. Patient educated on when it is appropriate to go to the emergency department.   Mary-Margaret Gaylyn Keas, FNP

## 2023-10-31 DIAGNOSIS — R300 Dysuria: Secondary | ICD-10-CM | POA: Diagnosis not present

## 2023-10-31 DIAGNOSIS — N202 Calculus of kidney with calculus of ureter: Secondary | ICD-10-CM | POA: Diagnosis not present

## 2023-11-08 ENCOUNTER — Other Ambulatory Visit: Payer: Self-pay | Admitting: Vascular Surgery

## 2023-11-20 ENCOUNTER — Ambulatory Visit

## 2023-11-20 DIAGNOSIS — E538 Deficiency of other specified B group vitamins: Secondary | ICD-10-CM

## 2023-11-20 NOTE — Progress Notes (Signed)
 Patient is in office today for a nurse visit for B12 Injection. Patient Injection was given in the  Left deltoid. Patient tolerated injection well.

## 2023-11-21 ENCOUNTER — Ambulatory Visit (HOSPITAL_COMMUNITY)
Admission: RE | Admit: 2023-11-21 | Discharge: 2023-11-21 | Disposition: A | Discharge status: Home / Self Care | Source: Ambulatory Visit | Attending: Hematology | Admitting: Hematology

## 2023-11-21 ENCOUNTER — Inpatient Hospital Stay: Attending: Hematology

## 2023-11-21 DIAGNOSIS — D508 Other iron deficiency anemias: Secondary | ICD-10-CM

## 2023-11-21 DIAGNOSIS — C180 Malignant neoplasm of cecum: Secondary | ICD-10-CM | POA: Insufficient documentation

## 2023-11-21 DIAGNOSIS — E611 Iron deficiency: Secondary | ICD-10-CM | POA: Diagnosis not present

## 2023-11-21 DIAGNOSIS — F17210 Nicotine dependence, cigarettes, uncomplicated: Secondary | ICD-10-CM | POA: Diagnosis not present

## 2023-11-21 DIAGNOSIS — N200 Calculus of kidney: Secondary | ICD-10-CM | POA: Diagnosis not present

## 2023-11-21 DIAGNOSIS — Z85038 Personal history of other malignant neoplasm of large intestine: Secondary | ICD-10-CM | POA: Insufficient documentation

## 2023-11-21 DIAGNOSIS — Z9049 Acquired absence of other specified parts of digestive tract: Secondary | ICD-10-CM | POA: Diagnosis not present

## 2023-11-21 DIAGNOSIS — I723 Aneurysm of iliac artery: Secondary | ICD-10-CM | POA: Diagnosis not present

## 2023-11-21 DIAGNOSIS — C189 Malignant neoplasm of colon, unspecified: Secondary | ICD-10-CM | POA: Diagnosis not present

## 2023-11-21 DIAGNOSIS — K5730 Diverticulosis of large intestine without perforation or abscess without bleeding: Secondary | ICD-10-CM | POA: Diagnosis not present

## 2023-11-21 LAB — CBC WITH DIFFERENTIAL/PLATELET
Abs Immature Granulocytes: 0.02 K/uL (ref 0.00–0.07)
Basophils Absolute: 0.1 K/uL (ref 0.0–0.1)
Basophils Relative: 1 %
Eosinophils Absolute: 0.3 K/uL (ref 0.0–0.5)
Eosinophils Relative: 5 %
HCT: 44.2 % (ref 39.0–52.0)
Hemoglobin: 14.5 g/dL (ref 13.0–17.0)
Immature Granulocytes: 0 %
Lymphocytes Relative: 26 %
Lymphs Abs: 1.9 K/uL (ref 0.7–4.0)
MCH: 32.9 pg (ref 26.0–34.0)
MCHC: 32.8 g/dL (ref 30.0–36.0)
MCV: 100.2 fL — ABNORMAL HIGH (ref 80.0–100.0)
Monocytes Absolute: 0.8 K/uL (ref 0.1–1.0)
Monocytes Relative: 11 %
Neutro Abs: 4.3 K/uL (ref 1.7–7.7)
Neutrophils Relative %: 57 %
Platelets: 227 K/uL (ref 150–400)
RBC: 4.41 MIL/uL (ref 4.22–5.81)
RDW: 13.8 % (ref 11.5–15.5)
WBC: 7.4 K/uL (ref 4.0–10.5)
nRBC: 0 % (ref 0.0–0.2)

## 2023-11-21 LAB — IRON AND TIBC
Iron: 115 ug/dL (ref 45–182)
Saturation Ratios: 31 % (ref 17.9–39.5)
TIBC: 375 ug/dL (ref 250–450)
UIBC: 260 ug/dL

## 2023-11-21 LAB — FERRITIN: Ferritin: 44 ng/mL (ref 24–336)

## 2023-11-21 LAB — COMPREHENSIVE METABOLIC PANEL WITH GFR
ALT: 26 U/L (ref 0–44)
AST: 23 U/L (ref 15–41)
Albumin: 3.4 g/dL — ABNORMAL LOW (ref 3.5–5.0)
Alkaline Phosphatase: 76 U/L (ref 38–126)
Anion gap: 8 (ref 5–15)
BUN: 16 mg/dL (ref 8–23)
CO2: 27 mmol/L (ref 22–32)
Calcium: 9.4 mg/dL (ref 8.9–10.3)
Chloride: 102 mmol/L (ref 98–111)
Creatinine, Ser: 1.22 mg/dL (ref 0.61–1.24)
GFR, Estimated: >60 mL/min (ref >60)
Glucose, Bld: 91 mg/dL (ref 70–99)
Potassium: 4.1 mmol/L (ref 3.5–5.1)
Sodium: 137 mmol/L (ref 135–145)
Total Bilirubin: 0.5 mg/dL (ref 0.0–1.2)
Total Protein: 7.1 g/dL (ref 6.5–8.1)

## 2023-11-21 MED ORDER — IOHEXOL 300 MG/ML  SOLN
100.0000 mL | Freq: Once | INTRAMUSCULAR | Status: AC | PRN
Start: 2023-11-21 — End: 2023-11-21
  Administered 2023-11-21: 100 mL via INTRAVENOUS

## 2023-11-22 LAB — CEA: CEA: 2.3 ng/mL (ref 0.0–4.7)

## 2023-11-25 IMAGING — DX DG CHEST 1V PORT
1 series · 1 of 1 positions shown · non-contrast
Comparison: Chest x-ray 07/18/2013.

CLINICAL DATA: Chest pain.

EXAM:
PORTABLE CHEST 1 VIEW

[chest ap]
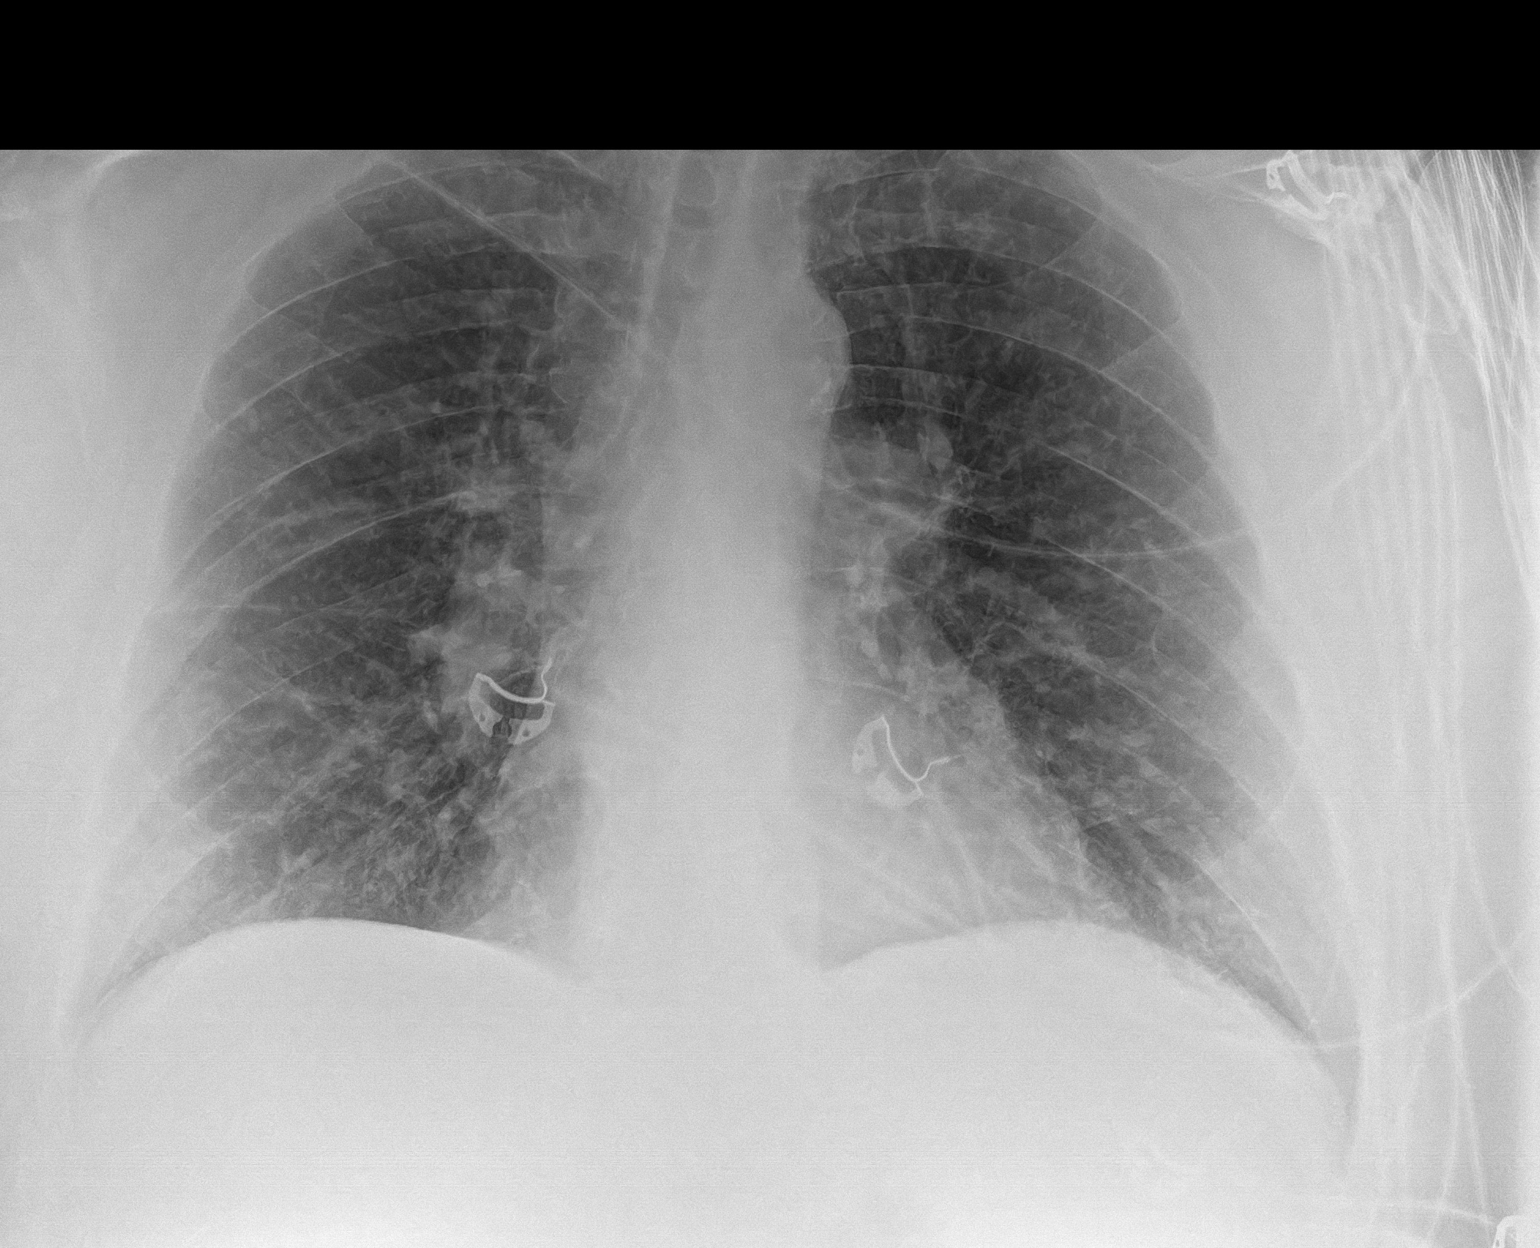

[1 of 1 positions shown; findings below may reference images not displayed]

FINDINGS: The cardiomediastinal silhouette is within normal limits. There is
some scattered reticular opacities throughout both lungs. There is
no lung consolidation, pleural effusion or pneumothorax identified.
No acute fractures are seen.
IMPRESSION: 1. Scattered reticular opacities throughout both lungs, nonspecific.
Findings may related to scarring or mild interstitial edema.

## 2023-11-28 ENCOUNTER — Encounter: Payer: Self-pay | Admitting: Hematology

## 2023-11-28 ENCOUNTER — Inpatient Hospital Stay (HOSPITAL_BASED_OUTPATIENT_CLINIC_OR_DEPARTMENT_OTHER): Admitting: Hematology

## 2023-11-28 VITALS — BP 140/79 | HR 72 | Temp 98.0°F | Resp 20 | Wt 319.7 lb

## 2023-11-28 DIAGNOSIS — Z9049 Acquired absence of other specified parts of digestive tract: Secondary | ICD-10-CM | POA: Diagnosis not present

## 2023-11-28 DIAGNOSIS — D508 Other iron deficiency anemias: Secondary | ICD-10-CM | POA: Diagnosis not present

## 2023-11-28 DIAGNOSIS — E611 Iron deficiency: Secondary | ICD-10-CM | POA: Diagnosis not present

## 2023-11-28 DIAGNOSIS — C180 Malignant neoplasm of cecum: Secondary | ICD-10-CM

## 2023-11-28 DIAGNOSIS — F17210 Nicotine dependence, cigarettes, uncomplicated: Secondary | ICD-10-CM | POA: Diagnosis not present

## 2023-11-28 NOTE — Progress Notes (Signed)
 [facility] [address] [place] [address]    Clinic Day:  11/28/2023  Referring physician: [name] LABOR, FNP  Patient Care Team: [name] LABOR, FNP as PCP - General (Family Medicine) Shaaron Lamar HERO, MD as Consulting Physician (Gastroenterology)   ASSESSMENT & PLAN:   Assessment: 1.  Stage II (T3 N0) cecal adenocarcinoma: - Colonoscopy (09/08/2022): Cecal mass - CT CAP (09/13/2022): Large cecal mass, small pericolonic and ileocolic nodes.  Multiple bilateral pulmonary nodules measuring up to 7 mm. - CEA (10/05/2022): 42.1 - Robotic assisted laparoscopic right hemicolectomy on 10/07/2022 by Dr. Mavis. - Pathology: Invasive moderately differentiated adenocarcinoma, margins negative, 0/18 lymph nodes involved, pT3 pN0, MMR preserved. - Signatera test in April 2025: Negative. - Colonoscopy (04/10/2023): Bleeding internal hemorrhoids.  Diverticulosis in sigmoid colon.  Single solitary ulcer at the colonic anastomosis, biopsied and benign.   2.  Social/family history: - Lives at home with his wife and is independent of ADLs and IADLs.  He is now working at a [employer].  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  Current active smoker, 1 pack/day, started at age 3. - 2-3 maternal aunts had cancer.  He does not know the type.    Plan: 1.  Stage II (T3 N0 G2) cecal adenocarcinoma: - He denies any change in bowel habits.  Denies bleeding per rectum or melena. - Reviewed labs from 11/21/2023: Normal LFTs and creatinine.  CBC grossly normal.  CEA was 2.3. - CTAP on 11/21/2023: No evidence of recurrence or metastatic disease.  Other benign findings were discussed. - Recommend follow-up in 3 months with repeat labs and Signatera.  Will repeat Signatera every 6 months along with CT scan for the first 2 years.  2.  Iron deficiency state: - He stopped taking iron tablet.  Ferritin is 44 but hemoglobin is normal at 14.5.  Will continue to  monitor intermittently.     Orders Placed This Encounter  Procedures   CBC with Differential    Standing Status:   Future    Expected Date:   02/26/2024    Expiration Date:   05/26/2024   Comprehensive metabolic panel    Standing Status:   Future    Expected Date:   02/26/2024    Expiration Date:   05/26/2024   CEA    Standing Status:   Future    Expected Date:   02/26/2024    Expiration Date:   05/26/2024   Iron and TIBC ([facility] ONLY)    Standing Status:   Future    Expected Date:   02/26/2024    Expiration Date:   05/26/2024   Ferritin    Standing Status:   Future    Expected Date:   02/26/2024    Expiration Date:   05/26/2024   Signatera    Select as applicable. If patient is on or planning to receive immunotherapy, select drug: Not on Immunotherapy If Other or Multiple, Write down drug name:  Do not Delete Below This Line   ==========Department Information========== ID: [id number] Department:[facility] - A DEPT OF [facility] [address] [place] [address] Dept: [phone] Dept Fax: [phone]    Standing Status:   Future    Expected Date:   02/28/2024    Expiration Date:   11/27/2024    Jennell to follow up with patient for sample collection (mobile phleb, lab, or  saliva)::   No    Surveillance Program draw frequency::   Every 3 Months    Surveillance Program draw count::   4    Cancer type::   Colon    Stage of diagnosis::   II    History of recurrence?:   No    Current disease status::   No evidence of disease    Is the patient receiving or planning to receive immunotherapy?:   No    Patient status::   Outpatient    By placing this electronic order I confirm the testing ordered herein is medically necessary and this patient has been informed of the details of the genetic test(s) ordered, including the risks, benefits, and alternatives, and has consented to testing.:    Yes    What type of billing?:   [employer] R Teague,acting as a Neurosurgeon for [employer], MD.,have documented all relevant documentation on the behalf of Alean Stands, MD,as directed by  Alean Stands, MD while in the presence of Alean Stands, MD.  I, Alean Stands MD, have reviewed the above documentation for accuracy and completeness, and I agree with the above.     Alean Stands, MD   7/15/20253:41 PM  CHIEF COMPLAINT:   Diagnosis: cecal cancer    Cancer Staging  Cecal cancer [facility]) Staging form: Colon and Rectum, AJCC 8th Edition - Clinical stage from 11/02/2022: Stage IIA (cT3, cN0, cM0) - Unsigned    Prior Therapy: right hemicolectomy, 10/07/22   Current Therapy: Surveillance   HISTORY OF PRESENT ILLNESS:   Oncology History   No history exists.     INTERVAL HISTORY:   Vincent Griffin is a 63 y.o. male presenting to clinic today for follow up of cecal cancer. He was last seen by me on 08/22/23.  Since his last visit, he underwent CT AP on 11/21/23 that found: No evidence of recurrent or metastatic carcinoma within the abdomen or pelvis. Sigmoid diverticulosis, without radiographic evidence of diverticulitis. Tiny nonobstructing right renal calculus.  He had endovascular stent graft insertion on 09/08/23. Vincent Griffin was then admitted to the hospital from 10/01/23 to 10/03/23 for severe GNR sepsis secondary to UTI and recurrent nephrolithiasis. He had a left uretal stent placed and an 8 mm obstructing stone was noted on imaging. He was treated with IV fluids, oral midodrine , and cefadroxil  for sepsis and UTI while hospitalized.   Vincent Griffin had repeat cystoscopy/ureteroscopy with holmium laser and stent placement on 10/25/23 due to stone found on last hospital admission.   Today, he states that he is doing well overall. His appetite level is at 100%. His energy level is at 25%. Vincent Griffin denies any changes in BM's, hematochezia, or melena. He  discontinued iron supplements since his last visit with me.   Vincent Griffin recently filed for full- time disability.   PAST MEDICAL HISTORY:   Past Medical History: Past Medical History:  Diagnosis Date   Anxiety    CAD (coronary artery disease)    Cancer (HCC)    colon   COPD (chronic obstructive pulmonary disease) (HCC)    Depression    GERD (gastroesophageal reflux disease)    History of kidney stones    Hypercholesterolemia    Hypertension    Iliac artery aneurysm, right (HCC)    Myocardial infarction (HCC) 2022   Peripheral vascular disease (HCC)    Pneumonia     Surgical History: Past Surgical History:  Procedure Laterality Date   ABDOMINAL AORTIC ENDOVASCULAR STENT GRAFT  N/A 09/08/2023   Procedure: INSERTION, ENDOVASCULAR STENT GRAFT, AORTA, ABDOMINAL;  Surgeon: Gretta Lonni PARAS, MD;  Location: [facility] OR;  Service: Vascular;  Laterality: N/A;   ANEURYSM COILING Left 09/08/2023   Procedure: EMBOLIZATION, USING COIL, OF RIGHT INTERNAL ILIAC;  Surgeon: Gretta Lonni PARAS, MD;  Location: [facility] OR;  Service: Vascular;  Laterality: Left;   APPENDECTOMY     BIOPSY  09/08/2022   Procedure: BIOPSY;  Surgeon: Cindie Carlin POUR, DO;  Location[facility];  Service: Endoscopy;;   BIOPSY  04/10/2023   Procedure: BIOPSY;  Surgeon: Cindie Carlin POUR, DO;  Location[facility];  Service: Endoscopy;;   CARDIAC CATHETERIZATION  07/2020   with stent.   CATARACT EXTRACTION     CATARACT EXTRACTION W/PHACO Left 01/07/2016   Procedure: CATARACT EXTRACTION PHACO AND INTRAOCULAR LENS PLACEMENT LEFT EYE CDE=13.32;  Surgeon: Cherene Mania, MD;  Location: [facility] ORS;  Service: Ophthalmology;  Laterality: Left;   COLONOSCOPY WITH PROPOFOL  N/A 09/08/2022   Procedure: COLONOSCOPY WITH PROPOFOL ;  Surgeon: Cindie Carlin POUR, DO;  [facility];  Service: Endoscopy;  Laterality: N/A;  11:30 am   COLONOSCOPY WITH PROPOFOL  N/A 04/10/2023   Procedure: COLONOSCOPY WITH PROPOFOL ;  Surgeon: Cindie Carlin POUR, DO;  Location: [facility];  Service: Endoscopy;  Laterality: N/A;  100pm, asa 3   CYSTOSCOPY W/ URETERAL STENT PLACEMENT Left 10/01/2023   Procedure: CYSTOSCOPY, WITH RETROGRADE PYELOGRAM AND URETERAL STENT INSERTION;  Surgeon: Lovie Arlyss CROME, MD;  Location: [facility] ORS;  Service: Urology;  Laterality: Left;   CYSTOSCOPY/URETEROSCOPY/HOLMIUM LASER/STENT PLACEMENT Left 10/25/2023   Procedure: CYSTOSCOPY/URETEROSCOPY/HOLMIUM LASER/STENT PLACEMENT;  Surgeon: Lovie Arlyss CROME, MD;  Location: [facility] ORS;  Service: Urology;  Laterality: Left;   POLYPECTOMY  09/08/2022   Procedure: POLYPECTOMY;  Surgeon: Cindie Carlin POUR, DO;  [facility];  Service: Endoscopy;;   POLYPECTOMY  04/10/2023   Procedure: POLYPECTOMY;  Surgeon: Cindie Carlin POUR, DO;  [facility];  Service: Endoscopy;;   ROBOT ASSISTED LAPAROSCOPIC PARTIAL COLECTOMY  10/07/2022   SMALL INTESTINE SURGERY  1972   SUBMUCOSAL TATTOO INJECTION  09/08/2022   Procedure: SUBMUCOSAL TATTOO INJECTION;  Surgeon: Cindie Carlin POUR, DO;  [facility];  Service: Endoscopy;;    Social History: Social History   Socioeconomic History   Marital status: Married    Spouse name: Not on file   Number of children: Not on file   Years of education: Not on file   Highest education level: Not on file  Occupational History   Not on file  Tobacco Use   Smoking status: Heavy Smoker    Current packs/day: 1.00    Types: Cigarettes   Smokeless tobacco: Never  Vaping Use   Vaping status: Never Used  Substance and Sexual Activity   Alcohol use: Yes    Alcohol/week: 2.0 standard drinks of alcohol    Types: 2 Cans of beer per week   Drug use: No   Sexual activity: Not on file  Other Topics Concern   Not on file  Social History Narrative   Not on file   Social Drivers of Health   Financial Resource Strain: Low Risk  (08/12/2020)   Received from [facility]   Overall Financial Resource Strain (CARDIA)    Difficulty of  Paying Living Expenses: Not hard at all  Food Insecurity: No Food Insecurity (10/04/2023)   Hunger Vital Sign    Worried About Running Out of Food in the Last Year: Never true  Ran Out of Food in the Last Year: Never true  Transportation Needs: No Transportation Needs (10/04/2023)   PRAPARE - Administrator, Civil Service (Medical): No    Lack of Transportation (Non-Medical): No  Physical Activity: Not on file  Stress: No Stress Concern Present (08/12/2020)   Received from [facility] - Occupational Stress Questionnaire    Feeling of Stress : Not at all  Social Connections: Unknown (09/20/2021)   [facility]   Social Network    Social Network: Not on file  Intimate Partner Violence: Not At Risk (10/04/2023)   Humiliation, Afraid, Rape, and Kick questionnaire    Fear of Current or Ex-Partner: No    Emotionally Abused: No    Physically Abused: No    Sexually Abused: No    Family History: Family History  Problem Relation Age of Onset   Diabetes Mother    Stroke Mother    Colon polyps Brother    Colon cancer Neg Hx     Current Medications:  Current Outpatient Medications:    albuterol  (VENTOLIN  HFA) 108 (90 Base) MCG/ACT inhaler, Inhale 2 puffs into the lungs every 6 (six) hours as needed for wheezing or shortness of breath., Disp: 8 g, Rfl: 2   aspirin  81 MG chewable tablet, Chew 81 mg by mouth in the morning., Disp: , Rfl:    atorvastatin  (LIPITOR) 80 MG tablet, Take 80 mg by mouth at bedtime., Disp: , Rfl:    budesonide -glycopyrrolate -formoterol  (BREZTRI  AEROSPHERE) 160-9-4.8 MCG/ACT AERO inhaler, Inhale 2 puffs into the lungs 2 (two) times daily. (Patient taking differently: Inhale 2 puffs into the lungs 2 (two) times daily as needed (SOB / Wheezing).), Disp: 10.7 g, Rfl: 11   furosemide  (LASIX ) 20 MG tablet, Take 1 tablet (20 mg total) by mouth daily., Disp: 30 tablet, Rfl: 3   metoprolol  succinate  (TOPROL -XL) 25 MG 24 hr tablet, Take 25 mg by mouth in the morning., Disp: , Rfl:    nitroGLYCERIN  (NITROSTAT ) 0.4 MG SL tablet, Place 0.4 mg under the tongue every 5 (five) minutes as needed for chest pain., Disp: , Rfl:   Current Facility-Administered Medications:    cyanocobalamin  (VITAMIN B12) injection 1,000 mcg, 1,000 mcg, Intramuscular, Q30 days, Lavell Lye A, FNP, 1,000 mcg at 11/20/23 1614   Allergies: Allergies  Allergen Reactions   Zetia [Ezetimibe] Diarrhea and Nausea Only    Upset stomach    REVIEW OF SYSTEMS:   Review of Systems  Constitutional:  Positive for fatigue. Negative for chills and fever.  HENT:   Negative for lump/mass, mouth sores, nosebleeds, sore throat and trouble swallowing.   Eyes:  Negative for eye problems.  Respiratory:  Negative for cough and shortness of breath.   Cardiovascular:  Positive for leg swelling. Negative for chest pain and palpitations.  Gastrointestinal:  Negative for abdominal pain, constipation, diarrhea, nausea and vomiting.  Genitourinary:  Negative for bladder incontinence, difficulty urinating, dysuria, frequency, hematuria and nocturia.   Musculoskeletal:  Negative for arthralgias, back pain, flank pain, myalgias and neck pain.  Skin:  Negative for itching and rash.  Neurological:  Negative for dizziness, headaches and numbness.  Hematological:  Does not bruise/bleed easily.  Psychiatric/Behavioral:  Negative for depression, sleep disturbance and suicidal ideas. The patient is not nervous/anxious.   All other systems reviewed and are negative.    VITALS:   Blood pressure (!) 140/79, pulse 72, temperature 98 F (36.7 C), temperature source Oral, resp.  rate 20, weight (!) 319 lb 10.7 oz (145 kg).  Wt Readings from Last 3 Encounters:  11/28/23 (!) 319 lb 10.7 oz (145 kg)  10/30/23 (!) 308 lb (139.7 kg)  10/26/23 (!) 316 lb (143.3 kg)    Body mass index is 45.87 kg/m.  Performance status (ECOG): 1 - Symptomatic but  completely ambulatory  PHYSICAL EXAM:   Physical Exam Vitals and nursing note reviewed. Exam conducted with a chaperone present.  Constitutional:      Appearance: Normal appearance.  Cardiovascular:     Rate and Rhythm: Normal rate and regular rhythm.     Pulses: Normal pulses.     Heart sounds: Normal heart sounds.  Pulmonary:     Effort: Pulmonary effort is normal.     Breath sounds: Normal breath sounds.  Abdominal:     Palpations: Abdomen is soft. There is no hepatomegaly, splenomegaly or mass.     Tenderness: There is no abdominal tenderness.  Musculoskeletal:     Right lower leg: No edema.     Left lower leg: No edema.  Lymphadenopathy:     Cervical: No cervical adenopathy.     Right cervical: No superficial, deep or posterior cervical adenopathy.    Left cervical: No superficial, deep or posterior cervical adenopathy.     Upper Body:     Right upper body: No supraclavicular or axillary adenopathy.     Left upper body: No supraclavicular or axillary adenopathy.  Neurological:     General: No focal deficit present.     Mental Status: He is alert and oriented to person, place, and time.  Psychiatric:        Mood and Affect: Mood normal.        Behavior: Behavior normal.     LABS:      Latest Ref Rng & Units 11/21/2023   12:58 PM 10/06/2023   11:54 AM 10/03/2023    5:02 AM  CBC  WBC 4.0 - 10.5 K/uL 7.4  9.6  12.4   Hemoglobin 13.0 - 17.0 g/dL 85.4  86.2  87.5   Hematocrit 39.0 - 52.0 % 44.2  42.6  40.3   Platelets 150 - 400 K/uL 227  216  131       Latest Ref Rng & Units 11/21/2023   12:58 PM 10/06/2023   11:54 AM 10/03/2023    5:02 AM  CMP  Glucose 70 - 99 mg/dL 91  89  897   BUN 8 - 23 mg/dL 16  13  27    Creatinine 0.61 - 1.24 mg/dL 8.77  9.00  8.88   Sodium 135 - 145 mmol/L 137  142  138   Potassium 3.5 - 5.1 mmol/L 4.1  4.8  4.4   Chloride 98 - 111 mmol/L 102  103  103   CO2 22 - 32 mmol/L 27  23  25    Calcium  8.9 - 10.3 mg/dL 9.4  8.8  8.5   Total  Protein 6.5 - 8.1 g/dL 7.1  6.4    Total Bilirubin 0.0 - 1.2 mg/dL 0.5  0.5    Alkaline Phos 38 - 126 U/L 76  120    AST 15 - 41 U/L 23  31    ALT 0 - 44 U/L 26  40       Lab Results  Component Value Date   CEA1 2.3 11/21/2023   /  CEA  Date Value Ref Range Status  11/21/2023 2.3 0.0 - 4.7 ng/mL Final  Comment:    (NOTE)                             Nonsmokers          <3.9                             Smokers             <5.6 Roche [employer] Electrochemiluminescence Immunoassay (ECLIA) Values obtained with different assay methods or kits cannot be used interchangeably.  Results cannot be interpreted as absolute evidence of the presence or absence of malignant disease. Performed At: [facility] [address] [place], [place] [id number] Jennette Shorter MD Ey:[id number]    Lab Results  Component Value Date   PSA1 0.5 06/05/2020   No results found for: CAN199 No results found for: CAN125  No results found for: STEPHANY CARLOTA BENSON MARKEL EARLA JOANNIE DOC, MSPIKE, SPEI Lab Results  Component Value Date   TIBC 375 11/21/2023   TIBC 330 07/18/2023   TIBC 371 06/01/2023   FERRITIN 44 11/21/2023   FERRITIN 106 07/18/2023   FERRITIN 43 06/01/2023   IRONPCTSAT 31 11/21/2023   IRONPCTSAT 34 07/18/2023   IRONPCTSAT 37 06/01/2023   No results found for: LDH   STUDIES:   CT ABDOMEN PELVIS W CONTRAST Result Date: 11/22/2023 CLINICAL DATA:  Follow-up colon carcinoma. Surveillance. * Tracking Code: BO * EXAM: CT ABDOMEN AND PELVIS WITH CONTRAST TECHNIQUE: Multidetector CT imaging of the abdomen and pelvis was performed using the standard protocol following bolus administration of intravenous contrast. RADIATION DOSE REDUCTION: This exam was performed according to the departmental dose-optimization program which includes automated exposure control, adjustment of the mA and/or kV according to patient size and/or use of iterative  reconstruction technique. CONTRAST:  OMNIPAQUE  IOHEXOL  300 MG/ML  SOLN COMPARISON:  06/01/2023 FINDINGS: Lower Chest: No acute findings. Hepatobiliary: No suspicious hepatic masses identified. Gallbladder is unremarkable. No evidence of biliary ductal dilatation. Pancreas:  No mass or inflammatory changes. Spleen: Within normal limits in size and appearance. Adrenals/Urinary Tract: No suspicious masses identified. 2 mm nonobstructing calculus again seen in upper pole of right kidney. No evidence of ureteral calculi or hydronephrosis. Stomach/Bowel: Stable postop changes from right colectomy. No masses identified. No evidence of obstruction, inflammatory process or abnormal fluid collections. Diverticulosis is seen mainly involving the sigmoid colon, however there is no evidence of diverticulitis. Vascular/Lymphatic: No pathologically enlarged lymph nodes. No acute vascular findings. Previous stent graft repair of right common iliac artery aneurysm. Stent grafts remain patent, with thrombosis of the aneurysm. Native aneurysm measures 3.3 cm, which is unchanged in size. Embolization coils noted at the right iliac bifurcation. Reproductive:  No mass or other significant abnormality. Other:  None. Musculoskeletal:  No suspicious bone lesions identified. IMPRESSION: No evidence of recurrent or metastatic carcinoma within the abdomen or pelvis. Sigmoid diverticulosis, without radiographic evidence of diverticulitis. Tiny nonobstructing right renal calculus. Electronically Signed   By: Norleen DELENA Kil M.D.   On: 11/22/2023 08:39

## 2023-11-28 NOTE — Patient Instructions (Signed)
 [facility] at [facility] Discharge Instructions   You were seen and examined today by Dr. Ellin Saba.  He reviewed the results of your lab work which are normal/stable.   He reviewed the results of your CT scan which did not show any evidence of cancer.   We will see you back in 3 months. We will repeat lab work prior to this visit.   Return as scheduled.    Thank you for choosing [facility] at [facility] to provide your oncology and hematology care.  To afford each patient quality time with our provider, please arrive at least 15 minutes before your scheduled appointment time.   If you have a lab appointment with the Cancer Center please come in thru the Main Entrance and check in at the main information desk.  You need to re-schedule your appointment should you arrive 10 or more minutes late.  We strive to give you quality time with our providers, and arriving late affects you and other patients whose appointments are after yours.  Also, if you no show three or more times for appointments you may be dismissed from the clinic at the providers discretion.     Again, thank you for choosing [facility].  Our hope is that these requests will decrease the amount of time that you wait before being seen by our physicians.       _____________________________________________________________  Should you have questions after your visit to [facility], please contact our office at [phone] and follow the prompts.  Our office hours are 8:00 a.m. and 4:30 p.m. Monday - Friday.  Please note that voicemails left after 4:00 p.m. may not be returned until the following business day.  We are closed weekends and major holidays.  You do have access to a nurse 24-7, just call the main number to the clinic [phone] and do not press any options, hold on the line and a nurse will answer the phone.    For prescription refill  requests, have your pharmacy contact our office and allow 72 hours.    Due to Covid, you will need to wear a mask upon entering the hospital. If you do not have a mask, a mask will be given to you at the Main Entrance upon arrival. For doctor visits, patients may have 1 support person age 32 or older with them. For treatment visits, patients can not have anyone with them due to social distancing guidelines and our immunocompromised population.

## 2023-11-29 NOTE — Telephone Encounter (Unsigned)
 Copied from CRM [id number]. Topic: General - Other >> Nov 29, 2023  9:48 AM Tobias CROME wrote: Reason for CRM: Patient in the process of filing full disablity   and would like to speak to Bari Learn, FNP directly. Patient would ike to speak to [place] about getting a leave of absence from work.   Patient requesting a callback, [phone]

## 2023-11-30 ENCOUNTER — Telehealth: Payer: Self-pay | Admitting: Family

## 2023-11-30 NOTE — Telephone Encounter (Signed)
 Patient calling because he has not heard back, please contact patient with an update.

## 2023-11-30 NOTE — Telephone Encounter (Signed)
 Copied from CRM [id number]. Topic: General - Other >> Nov 29, 2023  9:48 AM Tobias CROME wrote: Reason for CRM: Patient in the process of filing full disablity   and would like to speak to Bari Learn, FNP directly. Patient would ike to speak to [place] about getting a leave of absence from work.   Patient requesting a callback, [phone]

## 2023-11-30 NOTE — Telephone Encounter (Signed)
 Patient is asking for a letter that he is unable to work due to multiple health conditions until his disability paper work gets filled out. Please advise.

## 2023-12-01 NOTE — Telephone Encounter (Signed)
 I can not do disability. Recommend going to social services to file paper work.

## 2023-12-01 NOTE — Telephone Encounter (Signed)
 Copied from CRM [id number]. Topic: General - Other >> Nov 29, 2023  9:48 AM Vincent Griffin wrote: Reason for CRM: Patient in the process of filing full disablity   and would like to speak to Bari Learn, FNP directly. Patient would ike to speak to [place] about getting a leave of absence from work.   Patient requesting a callback, [phone] >> Dec 01, 2023  9:44 AM Vincent Griffin wrote: Patient calling because he has not heard back, please contact patient with an update. He needs letter by Monday if possible. Please call [name] at [phone] >> Nov 30, 2023  2:07 PM Vincent Griffin wrote: Patient called on 11/30/23 that he did not get a call back from [facility] , requesting about extended his FMLA  , patient stated he have a lot of health issues making it difficult to work ,   Pt. call back number  [phone]

## 2023-12-01 NOTE — Telephone Encounter (Signed)
 Ok for note

## 2023-12-01 NOTE — Telephone Encounter (Unsigned)
 Copied from CRM [id number]. Topic: General - Other >> Dec 01, 2023  2:49 PM Sophia H wrote: Reason for CRM: Patient following up on video visit request, wanting to know if anything can be done regarding his disability and leave request for work with [employer]. Has been trying to get it taken care of for the past few days. # [phone]

## 2023-12-01 NOTE — Telephone Encounter (Signed)
 Letter written and put up front patient aware

## 2023-12-04 DIAGNOSIS — N202 Calculus of kidney with calculus of ureter: Secondary | ICD-10-CM | POA: Diagnosis not present

## 2023-12-21 ENCOUNTER — Ambulatory Visit

## 2024-01-09 ENCOUNTER — Ambulatory Visit: Admitting: Family

## 2024-01-09 ENCOUNTER — Encounter: Payer: Self-pay | Admitting: Family

## 2024-01-09 VITALS — BP 136/88 | HR 88 | Temp 97.7°F | Ht 70.0 in | Wt 329.0 lb

## 2024-01-09 DIAGNOSIS — Z716 Tobacco abuse counseling: Secondary | ICD-10-CM

## 2024-01-09 DIAGNOSIS — I252 Old myocardial infarction: Secondary | ICD-10-CM

## 2024-01-09 DIAGNOSIS — E785 Hyperlipidemia, unspecified: Secondary | ICD-10-CM | POA: Diagnosis not present

## 2024-01-09 DIAGNOSIS — J439 Emphysema, unspecified: Secondary | ICD-10-CM | POA: Diagnosis not present

## 2024-01-09 DIAGNOSIS — Z720 Tobacco use: Secondary | ICD-10-CM | POA: Diagnosis not present

## 2024-01-09 DIAGNOSIS — I2585 Chronic coronary microvascular dysfunction: Secondary | ICD-10-CM | POA: Diagnosis not present

## 2024-01-09 DIAGNOSIS — E538 Deficiency of other specified B group vitamins: Secondary | ICD-10-CM | POA: Diagnosis not present

## 2024-01-09 DIAGNOSIS — I723 Aneurysm of iliac artery: Secondary | ICD-10-CM

## 2024-01-09 MED ORDER — FLUTICASONE FUROATE-VILANTEROL 200-25 MCG/ACT IN AEPB: 1.0000 | INHALATION_SPRAY | Freq: Every day | RESPIRATORY_TRACT | 4 refills | Status: AC

## 2024-01-09 MED ORDER — NICOTINE 21 MG/24HR TD PT24: 21.0000 mg | MEDICATED_PATCH | Freq: Every day | TRANSDERMAL | 2 refills | Status: AC

## 2024-01-09 NOTE — Patient Instructions (Signed)

## 2024-01-09 NOTE — Progress Notes (Signed)
 Subjective:    Patient ID: Vincent Griffin, male    DOB: 1961-05-11, 63 y.o.   MRN: [id number]  Chief Complaint  Patient presents with   Medical Management of Chronic Issues   Pt presents to the office today for chronic.    He had a NSTEMI in 08/12/20 and is followed by Cardiologists every 6 months.  He has hx colon cancer and had surgery to remove it. Doing well. Followed by Oncologists every 3 months.   He is a current smoker of a pack a day for 40 years.  He has COPD and has intermittent SOB. Using the albuterol  as needed. Could not tolerate Brezti.   He is morbid obese.   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 Recommend referral to or continued care with vascular specialist if clinically appropriate. Nicotine  Dependence Presents for follow-up visit. The symptoms have been stable. He smokes 1 pack of cigarettes per day. Compliance with prior treatments has been good.  Hyperlipidemia This is a chronic problem. The current episode started more than 1 year ago. The problem is controlled. Recent lipid tests were reviewed and are normal. Exacerbating diseases include obesity. Associated symptoms include shortness of breath. Current antihyperlipidemic treatment includes statins. The current treatment provides moderate improvement of lipids. Risk factors for coronary artery disease include dyslipidemia, hypertension, male sex, a sedentary lifestyle and obesity.  Hypertension This is a chronic problem. The current episode started more than 1 year ago. The problem has been resolved since onset. The problem is controlled. Associated symptoms include malaise/fatigue, peripheral edema and shortness of breath. Pertinent negatives include no headaches. Risk factors for coronary artery disease include dyslipidemia, obesity, male gender, sedentary lifestyle and smoking/tobacco exposure. The current  treatment provides moderate improvement.      Review of Systems  Constitutional:  Positive for malaise/fatigue.  Respiratory:  Positive for shortness of breath.   Neurological:  Negative for headaches.  All other systems reviewed and are negative.  Family History  Problem Relation Age of Onset   Diabetes Mother    Stroke Mother    Colon polyps Brother    Colon cancer Neg Hx    Social History   Socioeconomic History   Marital status: Married    Spouse name: Not on file   Number of children: Not on file   Years of education: Not on file   Highest education level: Not on file  Occupational History   Not on file  Tobacco Use   Smoking status: Heavy Smoker    Current packs/day: 1.00    Types: Cigarettes   Smokeless tobacco: Never  Vaping Use   Vaping status: Never Used  Substance and Sexual Activity   Alcohol use: Yes    Alcohol/week: 2.0 standard drinks of alcohol    Types: 2 Cans of beer per week   Drug use: No   Sexual activity: Not on file  Other Topics Concern   Not on file  Social History Narrative   Not on file   Social Drivers of Health   Financial Resource Strain: Low Risk  (08/12/2020)   [facility] from [facility]   Overall Financial Resource Strain (CARDIA)    Difficulty of Paying Living Expenses: Not hard at all  Food Insecurity: No Food Insecurity (10/04/2023)   Hunger Vital Sign    Worried About Running Out of Food in the Last Year:  Never true    Ran Out of Food in the Last Year: Never true  Transportation Needs: No Transportation Needs (10/04/2023)   PRAPARE - Administrator, Civil Service (Medical): No    Lack of Transportation (Non-Medical): No  Physical Activity: Not on file  Stress: No Stress Concern Present (08/12/2020)   Received from [facility] - Occupational Stress Questionnaire    Feeling of Stress : Not at all  Social Connections: Unknown (09/20/2021)   [facility] from [facility]    Social Network    Social Network: Not on file       Objective:   Physical Exam Vitals reviewed.  Constitutional:      General: He is not in acute distress.    Appearance: He is well-developed. He is obese.  HENT:     Head: Normocephalic.     Right Ear: Tympanic membrane normal.     Left Ear: Tympanic membrane normal.  Eyes:     General:        Right eye: No discharge.        Left eye: No discharge.     Pupils: Pupils are equal, round, and reactive to [place].  Neck:     Thyroid: No thyromegaly.  Cardiovascular:     Rate and Rhythm: Normal rate and regular rhythm.     Heart sounds: Normal heart sounds. No murmur heard. Pulmonary:     Effort: Pulmonary effort is normal. No respiratory distress.     Breath sounds: Wheezing and rhonchi present.  Abdominal:     General: Bowel sounds are normal. There is no distension.     Palpations: Abdomen is soft.     Tenderness: There is no abdominal tenderness.  Musculoskeletal:        General: No tenderness. Normal range of motion.     Cervical back: Normal range of motion and neck supple.     Right lower leg: Edema (2+) present.     Left lower leg: Edema (2+) present.     Comments: Bilateral discoloration of legs   Skin:    General: Skin is warm and dry.     Findings: Erythema present. No rash.     Comments: Slight erythemas and discoloration of bilateral legs  Neurological:     Mental Status: He is alert and oriented to person, place, and time.     Cranial Nerves: No cranial nerve deficit.     Deep Tendon Reflexes: Reflexes are normal and symmetric.  Psychiatric:        Behavior: Behavior normal.        Thought Content: Thought content normal.        Judgment: Judgment normal.        BP 136/88   Pulse 88   Temp 97.7 F (36.5 C) (Temporal)   Ht 5' 10 (1.778 m)   Wt (!) 329 lb (149.2 kg)   BMI 47.21 kg/m   Assessment & Plan:  Vincent Griffin comes in today with chief complaint of Medical Management of Chronic  Issues   Diagnosis and orders addressed:  1. Hyperlipidemia, unspecified hyperlipidemia type (Primary)  2. Tobacco abuse - nicotine  (NICODERM CQ  - DOSED IN MG/24 HOURS) 21 mg/24hr patch; Place 1 patch (21 mg total) onto the skin daily.  Dispense: 28 patch; Refill: 2  3. Chronic coronary microvascular dysfunction - fluticasone  furoate-vilanterol (BREO ELLIPTA ) 200-25 MCG/ACT AEPB; Inhale 1 puff into the lungs daily.  Dispense: 3  each; Refill: 4  4. Pulmonary emphysema, unspecified emphysema type (HCC)  5. Aneurysm artery, iliac (HCC)  6. H/O non-ST elevation myocardial infarction (NSTEMI)  7. Encounter for smoking cessation counseling - nicotine  (NICODERM CQ  - DOSED IN MG/24 HOURS) 21 mg/24hr patch; Place 1 patch (21 mg total) onto the skin daily.  Dispense: 28 patch; Refill: 2   Labs reviewed from Oncologists Start Breo Nicotine  patches Prescription sent to pharmacy, smoking cessation discussed  Encouraged to make Sleep study appointment  Health Maintenance reviewed Diet and exercise encouraged  Follow up plan: 3 months    Bari Learn, FNP

## 2024-01-21 ENCOUNTER — Other Ambulatory Visit: Payer: Self-pay | Admitting: Nurse Practitioner

## 2024-01-21 DIAGNOSIS — R600 Localized edema: Secondary | ICD-10-CM

## 2024-02-20 ENCOUNTER — Inpatient Hospital Stay: Payer: Self-pay | Attending: Hematology

## 2024-02-20 DIAGNOSIS — Z85038 Personal history of other malignant neoplasm of large intestine: Secondary | ICD-10-CM | POA: Insufficient documentation

## 2024-02-20 DIAGNOSIS — R051 Acute cough: Secondary | ICD-10-CM | POA: Insufficient documentation

## 2024-02-20 DIAGNOSIS — D508 Other iron deficiency anemias: Secondary | ICD-10-CM

## 2024-02-20 DIAGNOSIS — D509 Iron deficiency anemia, unspecified: Secondary | ICD-10-CM | POA: Insufficient documentation

## 2024-02-20 DIAGNOSIS — C180 Malignant neoplasm of cecum: Secondary | ICD-10-CM

## 2024-02-20 LAB — CBC WITH DIFFERENTIAL/PLATELET
Abs Immature Granulocytes: 0.02 K/uL (ref 0.00–0.07)
Basophils Absolute: 0.1 K/uL (ref 0.0–0.1)
Basophils Relative: 1 %
Eosinophils Absolute: 0.2 K/uL (ref 0.0–0.5)
Eosinophils Relative: 3 %
HCT: 46.4 % (ref 39.0–52.0)
Hemoglobin: 14.9 g/dL (ref 13.0–17.0)
Immature Granulocytes: 0 %
Lymphocytes Relative: 21 %
Lymphs Abs: 1.8 K/uL (ref 0.7–4.0)
MCH: 32.8 pg (ref 26.0–34.0)
MCHC: 32.1 g/dL (ref 30.0–36.0)
MCV: 102.2 fL — ABNORMAL HIGH (ref 80.0–100.0)
Monocytes Absolute: 0.6 K/uL (ref 0.1–1.0)
Monocytes Relative: 7 %
Neutro Abs: 5.9 K/uL (ref 1.7–7.7)
Neutrophils Relative %: 68 %
Platelets: 212 K/uL (ref 150–400)
RBC: 4.54 MIL/uL (ref 4.22–5.81)
RDW: 14 % (ref 11.5–15.5)
WBC: 8.6 K/uL (ref 4.0–10.5)
nRBC: 0 % (ref 0.0–0.2)

## 2024-02-20 LAB — COMPREHENSIVE METABOLIC PANEL WITH GFR
ALT: 23 U/L (ref 0–44)
AST: 27 U/L (ref 15–41)
Albumin: 4 g/dL (ref 3.5–5.0)
Alkaline Phosphatase: 97 U/L (ref 38–126)
Anion gap: 10 (ref 5–15)
BUN: 14 mg/dL (ref 8–23)
CO2: 26 mmol/L (ref 22–32)
Calcium: 9.2 mg/dL (ref 8.9–10.3)
Chloride: 103 mmol/L (ref 98–111)
Creatinine, Ser: 1.24 mg/dL (ref 0.61–1.24)
GFR, Estimated: >60 mL/min (ref >60)
Glucose, Bld: 103 mg/dL — ABNORMAL HIGH (ref 70–99)
Potassium: 4.2 mmol/L (ref 3.5–5.1)
Sodium: 139 mmol/L (ref 135–145)
Total Bilirubin: 0.6 mg/dL (ref 0.0–1.2)
Total Protein: 7.3 g/dL (ref 6.5–8.1)

## 2024-02-20 LAB — IRON AND TIBC
Iron: 129 ug/dL (ref 45–182)
Saturation Ratios: 35 % (ref 17.9–39.5)
TIBC: 364 ug/dL (ref 250–450)
UIBC: 235 ug/dL

## 2024-02-20 LAB — FERRITIN: Ferritin: 81 ng/mL (ref 24–336)

## 2024-02-21 LAB — CEA: CEA: 2.1 ng/mL (ref 0.0–4.7)

## 2024-02-27 ENCOUNTER — Inpatient Hospital Stay: Admitting: Oncology

## 2024-02-27 VITALS — BP 128/75 | HR 78 | Temp 98.5°F | Resp 18 | Ht 69.5 in | Wt 330.0 lb

## 2024-02-27 DIAGNOSIS — R051 Acute cough: Secondary | ICD-10-CM

## 2024-02-27 DIAGNOSIS — C182 Malignant neoplasm of ascending colon: Secondary | ICD-10-CM

## 2024-02-27 DIAGNOSIS — D508 Other iron deficiency anemias: Secondary | ICD-10-CM | POA: Diagnosis not present

## 2024-02-27 DIAGNOSIS — D509 Iron deficiency anemia, unspecified: Secondary | ICD-10-CM | POA: Insufficient documentation

## 2024-02-27 MED ORDER — AZITHROMYCIN 250 MG PO TABS: ORAL_TABLET | ORAL | 0 refills | Status: AC

## 2024-02-27 MED ORDER — HYDROCODONE BIT-HOMATROP MBR 5-1.5 MG/5ML PO SOLN: 5.0000 mL | Freq: Four times a day (QID) | ORAL | 0 refills | Status: AC | PRN

## 2024-02-27 NOTE — Assessment & Plan Note (Addendum)
 No longer taking iron supplements. Most recent iron labs show ferritin of 81 (44) with iron saturation 35%.  Hemoglobin 14.9. No additional workup needed.

## 2024-02-27 NOTE — Progress Notes (Signed)
 [facility] OFFICE PROGRESS NOTE  [name] LABOR, FNP  ASSESSMENT & PLAN:    Assessment & Plan Malignant neoplasm of ascending colon [facility]) - He denies any change in bowel habits.  Denies bleeding per rectum or melena. - Reviewed labs from 02/20/2024 show normal LFTs and creatinine.  CBC grossly normal.  CEA 2.1. -Signatera pending.  Will call patient with results. -Most recent CTAP on 11/21/2023 showed no evidence of recurrence of metastatic disease. - Recommend follow-up in 3 months with repeat labs and CT scan and will see provider.   Other iron deficiency anemia No longer taking iron supplements. Most recent iron labs show ferritin of 81 (44) with iron saturation 35%.  Hemoglobin 14.9. No additional workup needed.   Acute cough Declined chest x-ray. Had acute cough for 2 weeks.  Reports a cough it hurts his abdomen. Will go ahead and send him in azithromycin  2 to 50 mg tablets x 5 days.  Take 2 tabs on day 1 followed by 1 tablet until complete. Cheratussin for cough. Please let me know if your symptoms do not improve.  Orders Placed This Encounter  Procedures   CT ABDOMEN PELVIS W CONTRAST    Standing Status:   Future    Expected Date:   05/28/2024    Expiration Date:   02/26/2025    If indicated for the ordered procedure, I authorize the administration of contrast media per Radiology protocol:   Yes    Does the patient have a contrast media/X-ray dye allergy?:   No    Preferred imaging location?:   [facility]    If indicated for the ordered procedure, I authorize the administration of oral contrast media per Radiology protocol:   Yes   Ferritin    Standing Status:   Future    Expected Date:   05/21/2024    Expiration Date:   02/26/2025   Iron and TIBC ([facility] ONLY)    Standing Status:   Future    Expected Date:   05/21/2024    Expiration Date:   02/26/2025   CEA    Standing Status:   Future    Expected Date:   05/21/2024     Expiration Date:   02/26/2025   Comprehensive metabolic panel    Standing Status:   Future    Expected Date:   05/21/2024    Expiration Date:   02/26/2025   CBC with Differential    Standing Status:   Future    Expected Date:   05/21/2024    Expiration Date:   02/26/2025    INTERVAL HISTORY: Patient returns for follow-up for history of cecal cancer and iron deficiency anemia.  Appetite is 100% and energy levels are 40%.  Reports he has gained some weight over the past several months.  Reports 5 out of 10 abdominal pain secondary to coughing.  Reports he has been coughing for the last 2 to 3 weeks with green-yellow sputum production.  Reports he has not had a chest x-ray.  Thinks he may need antibiotics.  He has some shortness of breath.  He has also noticed some leg swelling bilaterally worse on the right side.  He has history of peripheral edema and has not been taking his Lasix  as prescribed.  States they have been weeping and he has sores that will pop and leak fluid out of it no or heat to the skin.  He denies any interval hospitalizations, surgeries or changes to  baseline health.  No melena, hematochezia or bright red blood per rectum.  We reviewed cbc, cmp, panel, ferritin  SUMMARY OF HEMATOLOGIC HISTORY: Oncology History Overview Note  Assessment: 1.  Stage II (T3 N0) cecal adenocarcinoma: - Colonoscopy (09/08/2022): Cecal mass - CT CAP (09/13/2022): Large cecal mass, small pericolonic and ileocolic nodes.  Multiple bilateral pulmonary nodules measuring up to 7 mm. - CEA (10/05/2022): 42.1 - Robotic assisted laparoscopic right hemicolectomy on 10/07/2022 by Dr. Mavis. - Pathology: Invasive moderately differentiated adenocarcinoma, margins negative, 0/18 lymph nodes involved, pT3 pN0, MMR preserved. - Signatera test in April 2025: Negative. - Colonoscopy (04/10/2023): Bleeding internal hemorrhoids.  Diverticulosis in sigmoid colon.  Single solitary ulcer at the colonic anastomosis,  biopsied and benign.   2.  Social/family history: - Lives at home with his wife and is independent of ADLs and IADLs.  He is now working at a [employer].  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  Current active smoker, 1 pack/day, started at age 63. - 2-3 maternal aunts had cancer.  He does not know the type.      No history exists.     CBC    Component Value Date/Time   WBC 8.6 02/20/2024 1404   RBC 4.54 02/20/2024 1404   HGB 14.9 02/20/2024 1404   HGB 13.7 10/06/2023 1154   HCT 46.4 02/20/2024 1404   HCT 42.6 10/06/2023 1154   PLT 212 02/20/2024 1404   PLT 216 10/06/2023 1154   MCV 102.2 (H) 02/20/2024 1404   MCV 100 (H) 10/06/2023 1154   MCH 32.8 02/20/2024 1404   MCHC 32.1 02/20/2024 1404   RDW 14.0 02/20/2024 1404   RDW 12.5 10/06/2023 1154   LYMPHSABS 1.8 02/20/2024 1404   LYMPHSABS 1.6 10/06/2023 1154   MONOABS 0.6 02/20/2024 1404   EOSABS 0.2 02/20/2024 1404   EOSABS 0.2 10/06/2023 1154   BASOSABS 0.1 02/20/2024 1404   BASOSABS 0.1 10/06/2023 1154       Latest Ref Rng & Units 02/20/2024    2:04 PM 11/21/2023   12:58 PM 10/06/2023   11:54 AM  CMP  Glucose 70 - 99 mg/dL 896  91  89   BUN 8 - 23 mg/dL 14  16  13    Creatinine 0.61 - 1.24 mg/dL 8.75  8.77  9.00   Sodium 135 - 145 mmol/L 139  137  142   Potassium 3.5 - 5.1 mmol/L 4.2  4.1  4.8   Chloride 98 - 111 mmol/L 103  102  103   CO2 22 - 32 mmol/L 26  27  23    Calcium  8.9 - 10.3 mg/dL 9.2  9.4  8.8   Total Protein 6.5 - 8.1 g/dL 7.3  7.1  6.4   Total Bilirubin 0.0 - 1.2 mg/dL 0.6  0.5  0.5   Alkaline Phos 38 - 126 U/L 97  76  120   AST 15 - 41 U/L 27  23  31    ALT 0 - 44 U/L 23  26  40      Lab Results  Component Value Date   FERRITIN 81 02/20/2024   VITAMINB12 326 10/01/2023    Vitals:   02/27/24 1404  BP: 128/75  Pulse: 78  Resp: 18  Temp: 98.5 F (36.9 C)  SpO2: 95%    Review of System:  Review of Systems  Constitutional:  Positive for malaise/fatigue.   Respiratory:  Positive for cough and shortness of breath.   Cardiovascular:  Positive for leg swelling.  Neurological:  Positive for weakness.    Physical Exam: Physical Exam Vitals reviewed.  Constitutional:      Appearance: Normal appearance. He is obese.  HENT:     Head: Normocephalic and atraumatic.  Eyes:     Pupils: Pupils are equal, round, and reactive to [place].  Cardiovascular:     Rate and Rhythm: Normal rate and regular rhythm.     Heart sounds: Normal heart sounds. No murmur heard. Pulmonary:     Effort: Pulmonary effort is normal.     Breath sounds: Examination of the right-upper field reveals decreased breath sounds. Examination of the left-upper field reveals decreased breath sounds. Decreased breath sounds present. No wheezing.     Comments: Coughing throughout exam. Abdominal:     General: Bowel sounds are normal. There is no distension.     Palpations: Abdomen is soft.     Tenderness: There is no abdominal tenderness.  Musculoskeletal:        General: Normal range of motion.     Cervical back: Normal range of motion.     Right lower leg: Swelling present. 2+ Pitting Edema present.     Left lower leg: Swelling present. 2+ Pitting Edema present.  Skin:    General: Skin is warm and dry.     Findings: No rash.  Neurological:     Mental Status: He is alert and oriented to person, place, and time.     Gait: Gait is intact.  Psychiatric:        Mood and Affect: Mood and affect normal.        Cognition and Memory: Memory normal.        Judgment: Judgment normal.      I spent 20 minutes dedicated to the care of this patient (face-to-face and non-face-to-face) on the date of the encounter to include what is described in the assessment and plan.,  Delon Hope, NP 02/27/2024 2:49 PM

## 2024-02-27 NOTE — Assessment & Plan Note (Addendum)
-   He denies any change in bowel habits.  Denies bleeding per rectum or melena. - Reviewed labs from 02/20/2024 show normal LFTs and creatinine.  CBC grossly normal.  CEA 2.1. -Signatera pending.  Will call patient with results. -Most recent CTAP on 11/21/2023 showed no evidence of recurrence of metastatic disease. - Recommend follow-up in 3 months with repeat labs and CT scan and will see provider.

## 2024-02-29 LAB — SIGNATERA
SIGNATERA MTM READOUT: 0 MTM/ml
SIGNATERA TEST RESULT: NEGATIVE

## 2024-04-08 NOTE — Progress Notes (Unsigned)
 Patient name: Vincent Griffin MRN: [id number] DOB: 1961-05-10 Sex: male  REASON FOR CONSULT: 6 month follow-up s/p repair right common iliac artery aneurysm with EVAR  HPI: Vincent Griffin is a 63 y.o. 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  He did well and was discharged home. About 3 weeks after his discharge he was in the hospital for sepsis related to a kidney stone and his blood cultures grew Proteus.  I did put him on 6 weeks of antibiotics to protect the endograft.  No new complaints today.  States he was laid off after he got full disability.   Past Medical History:  Diagnosis Date   Anxiety    CAD (coronary artery disease)    Cancer (HCC)    colon   COPD (chronic obstructive pulmonary disease) (HCC)    Depression    GERD (gastroesophageal reflux disease)    History of kidney stones    Hypercholesterolemia    Hypertension    Iliac artery aneurysm, right    Myocardial infarction (HCC) 2022   Peripheral vascular disease    Pneumonia     Past Surgical History:  Procedure Laterality Date   ABDOMINAL AORTIC ENDOVASCULAR STENT GRAFT N/A 09/08/2023   Procedure: INSERTION, ENDOVASCULAR STENT GRAFT, AORTA, ABDOMINAL;  Surgeon: Gretta Lonni PARAS, MD;  Location: [facility];  Service: Vascular;  Laterality: N/A;   ANEURYSM COILING Left 09/08/2023   Procedure: EMBOLIZATION, USING COIL, OF RIGHT INTERNAL ILIAC;  Surgeon: Gretta Lonni PARAS, MD;  Location: [facility];  Service: Vascular;  Laterality: Left;   APPENDECTOMY     BIOPSY  09/08/2022   Procedure: BIOPSY;  Surgeon: Cindie Carlin POUR, DO;  Location: [facility];  Service: Endoscopy;;   BIOPSY  04/10/2023   Procedure: BIOPSY;  Surgeon: Cindie Carlin POUR, DO;  [facility];  Service: Endoscopy;;   CARDIAC  CATHETERIZATION  07/2020   with stent.   CATARACT EXTRACTION     CATARACT EXTRACTION W/PHACO Left 01/07/2016   Procedure: CATARACT EXTRACTION PHACO AND INTRAOCULAR LENS PLACEMENT LEFT EYE CDE=13.32;  Surgeon: Cherene Mania, MD;  Location: [facility];  Service: Ophthalmology;  Laterality: Left;   COLONOSCOPY WITH PROPOFOL  N/A 09/08/2022   Procedure: COLONOSCOPY WITH PROPOFOL ;  Surgeon: Cindie Carlin POUR, DO;  [facility];  Service: Endoscopy;  Laterality: N/A;  11:30 am   COLONOSCOPY WITH PROPOFOL  N/A 04/10/2023   Procedure: COLONOSCOPY WITH PROPOFOL ;  Surgeon: Cindie Carlin POUR, DO;  [facility];  Service: Endoscopy;  Laterality: N/A;  100pm, asa 3   CYSTOSCOPY W/ URETERAL STENT PLACEMENT Left 10/01/2023   Procedure: CYSTOSCOPY, WITH RETROGRADE PYELOGRAM AND URETERAL STENT INSERTION;  Surgeon: Lovie Arlyss CROME, MD;  Location: [facility];  Service: Urology;  Laterality: Left;   CYSTOSCOPY/URETEROSCOPY/HOLMIUM LASER/STENT PLACEMENT Left 10/25/2023   Procedure: CYSTOSCOPY/URETEROSCOPY/HOLMIUM LASER/STENT PLACEMENT;  Surgeon: Lovie Arlyss CROME, MD;  Location: [facility];  Service: Urology;  Laterality: Left;   POLYPECTOMY  09/08/2022   Procedure: POLYPECTOMY;  Surgeon: Cindie Carlin POUR, DO;  [facility];  Service: Endoscopy;;   POLYPECTOMY  04/10/2023   Procedure: POLYPECTOMY;  Surgeon: Cindie Carlin POUR, DO;  [facility];  Service: Endoscopy;;   ROBOT ASSISTED LAPAROSCOPIC PARTIAL COLECTOMY  10/07/2022   SMALL INTESTINE SURGERY  1972   SUBMUCOSAL TATTOO INJECTION  09/08/2022   Procedure: SUBMUCOSAL TATTOO INJECTION;  Surgeon: Cindie Carlin POUR, DO;  [facility];  Service: Endoscopy;;    Family History  Problem Relation Age of Onset   Diabetes Mother    Stroke Mother    Colon polyps Brother    Colon cancer Neg Hx     SOCIAL HISTORY: Social History   Socioeconomic History   Marital status: Married    Spouse name: Not on file   Number of  children: Not on file   Years of education: Not on file   Highest education level: Not on file  Occupational History   Not on file  Tobacco Use   Smoking status: Heavy Smoker    Current packs/day: 1.00    Types: Cigarettes   Smokeless tobacco: Never  Vaping Use   Vaping status: Never Used  Substance and Sexual Activity   Alcohol use: Yes    Alcohol/week: 2.0 standard drinks of alcohol    Types: 2 Cans of beer per week   Drug use: No   Sexual activity: Not on file  Other Topics Concern   Not on file  Social History Narrative   Not on file   Social Drivers of Health   Financial Resource Strain: Low Risk  (08/12/2020)   Received from [facility]   Overall Financial Resource Strain (CARDIA)    Difficulty of Paying Living Expenses: Not hard at all  Food Insecurity: No Food Insecurity (10/04/2023)   Hunger Vital Sign    Worried About Running Out of Food in the Last Year: Never true    Ran Out of Food in the Last Year: Never true  Transportation Needs: No Transportation Needs (10/04/2023)   PRAPARE - Administrator, Civil Service (Medical): No    Lack of Transportation (Non-Medical): No  Physical Activity: Not on file  Stress: No Stress Concern Present (08/12/2020)   Received from [facility] Occup[facility] - Occupational Stress Questionnaire    Feeling of Stress : Not at all  Social Connections: Not on file  Intimate Partner Violence: Not At Risk (10/04/2023)   Humiliation, Afraid, Rape, and Kick questionnaire    Fear of Current or Ex-Partner: No    Emotionally Abused: No    Physically Abused: No    Sexually Abused: No    Allergies  Allergen Reactions   Zetia [Ezetimibe] Diarrhea and Nausea Only    Upset stomach    Current Outpatient Medications  Medication Sig Dispense Refill   albuterol  (VENTOLIN  HFA) 108 (90 Base) MCG/ACT inhaler Inhale 2 puffs into the lungs every 6 (six) hours as needed for wheezing or shortness of  breath. 8 g 2   aspirin  81 MG chewable tablet Chew 81 mg by mouth in the morning.     atorvastatin  (LIPITOR) 80 MG tablet Take 80 mg by mouth at bedtime.     azithromycin  (ZITHROMAX ) 250 MG tablet 2 tabs on day 1 followed by 1 tablet daily until done. 6 each 0   fluticasone  furoate-vilanterol (BREO ELLIPTA ) 200-25 MCG/ACT AEPB Inhale 1 puff into the lungs daily. 3 each 4   furosemide  (LASIX ) 20 MG tablet TAKE 1 TABLET BY MOUTH EVERY DAY 90 tablet 0   HYDROcodone  bit-homatropine (HYCODAN) 5-1.5 MG/5ML syrup Take 5 mLs by mouth every 6 (six) hours as needed for cough. 120 mL 0   metoprolol  succinate (TOPROL -XL) 25 MG 24 hr tablet Take 25 mg by mouth in the morning.  nicotine  (NICODERM CQ  - DOSED IN MG/24 HOURS) 21 mg/24hr patch Place 1 patch (21 mg total) onto the skin daily. 28 patch 2   nitroGLYCERIN  (NITROSTAT ) 0.4 MG SL tablet Place 0.4 mg under the tongue every 5 (five) minutes as needed for chest pain.     Current Facility-Administered Medications  Medication Dose Route Frequency Provider Last Rate Last Admin   cyanocobalamin  (VITAMIN B12) injection 1,000 mcg  1,000 mcg Intramuscular Q30 days Lavell Lye A, FNP   1,000 mcg at 01/09/24 1530    REVIEW OF SYSTEMS:  [X]  denotes positive finding, [ ]  denotes negative finding Cardiac  Comments:  Chest pain or chest pressure:    Shortness of breath upon exertion:    Short of breath when lying flat:    Irregular heart rhythm:        Vascular    Pain in calf, thigh, or hip brought on by ambulation:    Pain in feet at night that wakes you up from your sleep:     Blood clot in your veins:    Leg swelling:         Pulmonary    Oxygen at home:    Productive cough:     Wheezing:         Neurologic    Sudden weakness in arms or legs:     Sudden numbness in arms or legs:     Sudden onset of difficulty speaking or slurred speech:    Temporary loss of vision in one eye:     Problems with dizziness:         Gastrointestinal    Blood  in stool:     Vomited blood:         Genitourinary    Burning when urinating:     Blood in urine:        Psychiatric    Major depression:         Hematologic    Bleeding problems:    Problems with blood clotting too easily:        Skin    Rashes or ulcers:        Constitutional    Fever or chills:      PHYSICAL EXAM: There were no vitals filed for this visit.  GENERAL: The patient is a well-nourished male, in no acute distress. The vital signs are documented above. CARDIAC: There is a regular rate and rhythm.  VASCULAR:  Bilateral femoral pulses palpable Bilateral DP pulses palpable Bilateral lower extremity venous stasis changes PULMONARY: No respiratory distress. ABDOMEN: Soft and non-tender. MUSCULOSKELETAL: There are no major deformities or cyanosis. NEUROLOGIC: No focal weakness or paresthesias are detected. SKIN: There are no ulcers or rashes noted. PSYCHIATRIC: The patient has a normal affect.  DATA:    EVAR duplex today and unable to visualize repair  CT abdomen pelvis reviewed 11/21/2023 with native aneurysm sac down to 3.3 cm and coils in good position  CTA reviewed from 09/27/2023 with stent graft in good position and adequate treatment of right common iliac aneurysm.  Both limbs are widely patent.  There is coiling of the right hypogastric.  No stranding around the graft.  Assessment/Plan:  63 y.o. male, with history CAD status post MI, hypertension, hyperlipidemia that presents 3-month follow-up for surveillance of his endograft.  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    About 3 weeks after his  surgery he got readmitted with sepsis from an obstructing ureteral stone.  Blood cultures grew Proteus.  He was discharged on 2 weeks of PO cefadroxil  and I extended his antibiotic course to 6 weeks to protect the endograft.  Unable to visualize his  repair on duplex today.  I was able to review his CT abdomen pelvis he had a back on 11/21/2023 with coils and endograft in good position and native aneurysm sac decreased to 3.3 cm compared to 3.5 cm at time of repair.  I think these are good results as we discussed today.  I will follow-up again in 1 year with EVAR duplex for ongoing surveillance.  Discussed he call with questions or concerns.  Lonni DOROTHA Gaskins, MD [facility] Office: [phone]

## 2024-04-09 ENCOUNTER — Other Ambulatory Visit: Payer: Self-pay | Admitting: Vascular Surgery

## 2024-04-09 ENCOUNTER — Encounter: Payer: Self-pay | Admitting: Vascular Surgery

## 2024-04-09 ENCOUNTER — Ambulatory Visit (HOSPITAL_COMMUNITY)
Admission: RE | Admit: 2024-04-09 | Discharge: 2024-04-09 | Disposition: A | Discharge status: Home / Self Care | Payer: Self-pay | Source: Ambulatory Visit | Attending: Vascular Surgery | Admitting: Vascular Surgery

## 2024-04-09 ENCOUNTER — Ambulatory Visit (INDEPENDENT_AMBULATORY_CARE_PROVIDER_SITE_OTHER): Payer: Self-pay | Admitting: Vascular Surgery

## 2024-04-09 VITALS — BP 139/86 | HR 68 | Temp 97.7°F | Resp 20 | Ht 69.5 in | Wt 339.7 lb

## 2024-04-09 DIAGNOSIS — I723 Aneurysm of iliac artery: Secondary | ICD-10-CM

## 2024-04-15 ENCOUNTER — Other Ambulatory Visit: Payer: Self-pay | Admitting: Family

## 2024-04-15 ENCOUNTER — Ambulatory Visit: Payer: Self-pay

## 2024-04-15 ENCOUNTER — Telehealth: Payer: Self-pay | Admitting: Family

## 2024-04-15 ENCOUNTER — Ambulatory Visit: Payer: Self-pay | Admitting: Family

## 2024-04-15 DIAGNOSIS — R600 Localized edema: Secondary | ICD-10-CM

## 2024-04-15 MED ORDER — FUROSEMIDE 20 MG PO TABS: 20.0000 mg | ORAL_TABLET | Freq: Every day | ORAL | 1 refills | Status: AC

## 2024-04-15 NOTE — Telephone Encounter (Signed)
 Patient aware

## 2024-04-15 NOTE — Telephone Encounter (Signed)
 FYI Only or Action Required?: Action required by provider: see refill encounter for Lasix .  Patient was last seen in primary care on 01/09/2024 by [name] LABOR, FNP.  Called Nurse Triage reporting Leg Swelling.  Symptoms began several months ago.  Interventions attempted: Prescription medications: Lasix .  Symptoms are: gradually improving.  Triage Disposition: See PCP Within 2 Weeks  Patient/caregiver understands and will follow disposition?: No, wishes to speak with PCP  Copied from CRM #[id number]. Topic: Clinical - Red Word Triage >> Apr 15, 2024 12:50 PM Brittney F wrote: Red Word that prompted transfer to Nurse Triage:   Concern: Leg and feet swelling  Mentioned the matter to his Cardiologist which he saw on 04/09/2024.  Patient is actively uninsured and looking to confirm cost of the appointment he is scheduled for today. Patient was informed the cost of his visit would not be able to determined until in the office. Due to this the patient would like to cancel.   Symptoms:  Swelling  When did the symptoms start?: chronic   What have you done to aid in the concern ? Have you taken anything to assist with the matter?: Yes   If so, what did you take?: furosemide  (LASIX ) 20 MG tablet (Patient mentioned running low on prescription)    Wanted to let you know I will be transferring you to further discuss your concern. Please be advised the nurse can assist with scheduling. Reason for Disposition  [1] MILD swelling of both ankles (i.e., pedal edema) AND [2] is a chronic symptom (recurrent or ongoing AND present > 4 weeks)  Answer Assessment - Initial Assessment Questions Missed a few days and it swole back up- Saw cardiologist on 11/25- advised to get back on schedule with meds. Has gone back down some but not fully resolved. Needing refill of Lasix . Had to cancel appt today due to no insurance-  applied Medicaid. Wanting to reschedule when his insurance restored.  ED/UC  precautions advised. Please see refill encounter for lasix  script. Pharmacy confirmed  1. ONSET: When did the swelling start? (e.g., minutes, hours, days)     Chronic 2. LOCATION: What part of the leg is swollen?  Are both legs swollen or just one leg?     Bilateral ankle and feet 3. SEVERITY: How bad is the swelling? (e.g., localized; mild, moderate, severe)     Feet and ankles- mild- moderate 4. REDNESS: Is there redness or signs of infection?     denies 5. PAIN: Is the swelling painful to touch? If Yes, ask: How painful is it?   (Scale 1-10; mild, moderate or severe)     2-3/10 depending if the dog hits it 6. FEVER: Do you have a fever? If Yes, ask: What is it, how was it measured, and when did it start?      denies 7. CAUSE: What do you think is causing the leg swelling?     chronic 8. MEDICAL HISTORY: Do you have a history of blood clots (e.g., DVT), cancer, heart failure, kidney disease, or liver failure?     MI, HF  9. RECURRENT SYMPTOM: Have you had leg swelling before? If Yes, ask: When was the last time? What happened that time?     chronic 10. OTHER SYMPTOMS: Do you have any other symptoms? (e.g., chest pain, difficulty breathing)       denies  Protocols used: Leg Swelling and Edema-A-AH

## 2024-04-15 NOTE — Telephone Encounter (Signed)
Lasix Prescription sent to pharmacy

## 2024-04-15 NOTE — Telephone Encounter (Unsigned)
 Copied from CRM #[id number]. Topic: Clinical - Medication Refill >> Apr 15, 2024 12:55 PM Brittney F wrote: Patient called in to cancel recent appointment due to not having insurance. Mentioned swelling in legs and feet despite taking the listed prescription. Call was escalated to Nurse Triage. During conversation the patient did mention he is running low on the listed prescription.    Medication: furosemide  (LASIX ) 20 MG tablet  Has the patient contacted their pharmacy? No   This is the patient's preferred pharmacy:  [id number] - [place], [place] - [address] [place] [address] Phone: [phone] Fax: [phone]  Is this the correct pharmacy for this prescription? Yes   Has the prescription been filled recently? No  Is the patient out of the medication? No  Has the patient been seen for an appointment in the last year OR does the patient have an upcoming appointment? Yes  Can we respond through MyChart? No  Agent: Please be advised that Rx refills may take up to 3 business days. We ask that you follow-up with your pharmacy.

## 2024-04-18 NOTE — Telephone Encounter (Signed)
 Already sent in

## 2024-05-28 ENCOUNTER — Inpatient Hospital Stay: Payer: Self-pay | Attending: Hematology

## 2024-05-28 ENCOUNTER — Inpatient Hospital Stay: Payer: Self-pay

## 2024-05-28 ENCOUNTER — Ambulatory Visit (HOSPITAL_COMMUNITY)
Admission: RE | Admit: 2024-05-28 | Discharge: 2024-05-28 | Disposition: A | Discharge status: Home / Self Care | Payer: Self-pay | Source: Ambulatory Visit | Attending: Oncology | Admitting: Oncology

## 2024-05-28 DIAGNOSIS — C182 Malignant neoplasm of ascending colon: Secondary | ICD-10-CM

## 2024-05-28 DIAGNOSIS — M47816 Spondylosis without myelopathy or radiculopathy, lumbar region: Secondary | ICD-10-CM | POA: Insufficient documentation

## 2024-05-28 DIAGNOSIS — D500 Iron deficiency anemia secondary to blood loss (chronic): Secondary | ICD-10-CM | POA: Diagnosis present

## 2024-05-28 DIAGNOSIS — Z85038 Personal history of other malignant neoplasm of large intestine: Secondary | ICD-10-CM | POA: Insufficient documentation

## 2024-05-28 DIAGNOSIS — K5730 Diverticulosis of large intestine without perforation or abscess without bleeding: Secondary | ICD-10-CM | POA: Insufficient documentation

## 2024-05-28 DIAGNOSIS — F17200 Nicotine dependence, unspecified, uncomplicated: Secondary | ICD-10-CM | POA: Insufficient documentation

## 2024-05-28 DIAGNOSIS — C180 Malignant neoplasm of cecum: Secondary | ICD-10-CM

## 2024-05-28 DIAGNOSIS — D508 Other iron deficiency anemias: Secondary | ICD-10-CM

## 2024-05-28 LAB — COMPREHENSIVE METABOLIC PANEL WITH GFR
ALT: 24 U/L (ref 0–44)
AST: 25 U/L (ref 15–41)
Albumin: 4 g/dL (ref 3.5–5.0)
Alkaline Phosphatase: 102 U/L (ref 38–126)
Anion gap: 12 (ref 5–15)
BUN: 15 mg/dL (ref 8–23)
CO2: 26 mmol/L (ref 22–32)
Calcium: 9.1 mg/dL (ref 8.9–10.3)
Chloride: 103 mmol/L (ref 98–111)
Creatinine, Ser: 1.21 mg/dL (ref 0.61–1.24)
GFR, Estimated: >60 mL/min
Glucose, Bld: 110 mg/dL — ABNORMAL HIGH (ref 70–99)
Potassium: 4.8 mmol/L (ref 3.5–5.1)
Sodium: 141 mmol/L (ref 135–145)
Total Bilirubin: 0.4 mg/dL (ref 0.0–1.2)
Total Protein: 7.4 g/dL (ref 6.5–8.1)

## 2024-05-28 LAB — CBC WITH DIFFERENTIAL/PLATELET
Abs Immature Granulocytes: 0.01 K/uL (ref 0.00–0.07)
Basophils Absolute: 0.1 K/uL (ref 0.0–0.1)
Basophils Relative: 1 %
Eosinophils Absolute: 0.3 K/uL (ref 0.0–0.5)
Eosinophils Relative: 4 %
HCT: 49.9 % (ref 39.0–52.0)
Hemoglobin: 16 g/dL (ref 13.0–17.0)
Immature Granulocytes: 0 %
Lymphocytes Relative: 20 %
Lymphs Abs: 1.5 K/uL (ref 0.7–4.0)
MCH: 33.1 pg (ref 26.0–34.0)
MCHC: 32.1 g/dL (ref 30.0–36.0)
MCV: 103.1 fL — ABNORMAL HIGH (ref 80.0–100.0)
Monocytes Absolute: 0.6 K/uL (ref 0.1–1.0)
Monocytes Relative: 8 %
Neutro Abs: 4.8 K/uL (ref 1.7–7.7)
Neutrophils Relative %: 67 %
Platelets: 192 K/uL (ref 150–400)
RBC: 4.84 MIL/uL (ref 4.22–5.81)
RDW: 13.6 % (ref 11.5–15.5)
WBC: 7.2 K/uL (ref 4.0–10.5)
nRBC: 0 % (ref 0.0–0.2)

## 2024-05-28 LAB — FERRITIN: Ferritin: 80 ng/mL (ref 24–336)

## 2024-05-28 LAB — IRON AND TIBC
Iron: 101 ug/dL (ref 45–182)
Saturation Ratios: 27 % (ref 17.9–39.5)
TIBC: 370 ug/dL (ref 250–450)
UIBC: 269 ug/dL

## 2024-05-28 MED ORDER — IOHEXOL 9 MG/ML PO SOLN
ORAL | Status: AC
  Filled 2024-05-28: qty 1000

## 2024-05-28 MED ORDER — IOHEXOL 9 MG/ML PO SOLN
500.0000 mL | ORAL | Status: AC
  Administered 2024-05-28: 500 mL via ORAL

## 2024-05-28 MED ORDER — IOHEXOL 300 MG/ML  SOLN
100.0000 mL | Freq: Once | INTRAMUSCULAR | Status: AC | PRN
  Administered 2024-05-28: 100 mL via INTRAVENOUS

## 2024-05-29 LAB — CEA: CEA: 2.4 ng/mL (ref 0.0–4.7)

## 2024-06-04 ENCOUNTER — Inpatient Hospital Stay: Payer: Self-pay | Admitting: Oncology

## 2024-06-05 LAB — SIGNATERA
SIGNATERA MTM READOUT: 0 MTM/ml
SIGNATERA TEST RESULT: NEGATIVE

## 2024-06-06 ENCOUNTER — Inpatient Hospital Stay: Payer: Self-pay | Admitting: Oncology

## 2024-06-06 VITALS — BP 131/81 | HR 80 | Temp 98.0°F | Resp 18 | Ht 69.0 in | Wt 336.0 lb

## 2024-06-06 DIAGNOSIS — C180 Malignant neoplasm of cecum: Secondary | ICD-10-CM

## 2024-06-06 DIAGNOSIS — D500 Iron deficiency anemia secondary to blood loss (chronic): Secondary | ICD-10-CM | POA: Diagnosis not present

## 2024-06-06 NOTE — Assessment & Plan Note (Addendum)
 No longer taking iron supplements. Most recent iron labs show ferritin of 80 (81), iron saturation 27% (35%) and hemoglobin 16.0. No additional workup needed.

## 2024-06-06 NOTE — Progress Notes (Signed)
 "  [facility] OFFICE PROGRESS NOTE  Vincent Griffin LABOR, FNP  ASSESSMENT & PLAN:    Assessment & Plan Iron deficiency anemia due to chronic blood loss No longer taking iron supplements. Most recent iron labs show ferritin of 80 (81), iron saturation 27% (35%) and hemoglobin 16.0. No additional workup needed.   Cecal cancer (HCC)  - He denies any change in bowel habits.  Denies bleeding per rectum or melena. - Reviewed labs from 05/28/2024 show normal LFTs and creatinine.  CBC grossly normal.  CEA 2.4.  -Signatera testing was negative.  Repeat in 6 months. -Most recent CTAP on 05/28/2024 shows no findings of active malignancy in the abdomen or pelvis.  Postsurgical changes of partial right [facility] Griffin colectomy.  Systemic arthrosclerosis.  Mild sigmoid diverticulosis and lumbar spondylosis and degenerative disc disease. - Recommend follow-up in 3 months with repeat labs and in 6 months with labs, CT scan and Signatera testing.    Orders Placed This Encounter  Procedures   CEA    Standing Status:   Future    Expected Date:   08/29/2024    Expiration Date:   06/06/2025   Comprehensive metabolic panel    Standing Status:   Future    Expected Date:   08/29/2024    Expiration Date:   06/06/2025   CBC with Differential    Standing Status:   Future    Expected Date:   08/29/2024    Expiration Date:   06/06/2025   Signatera    Select as applicable. If patient is on or planning to receive immunotherapy, select drug: Not on Immunotherapy If Other or Multiple, Write down drug name:  Do not Delete Below This Line   ==========Department Information========== ID: [id number] Department:[facility] Dept: [phone] Dept Fax: [phone]    Standing Status:   Future    Expected Date:   12/04/2024    Expiration Date:   03/04/2025    [facility] to follow up with  patient for sample collection (mobile phleb, lab, or saliva)::   No    Surveillance Program draw frequency::   Every 3 Months    Surveillance Program draw count::   4    Cancer type::   Colon    Stage of diagnosis::   II    History of recurrence?:   No    Current disease status::   No evidence of disease    By placing this electronic order I confirm the testing ordered herein is medically necessary and this patient has been informed of the details of the genetic test(s) ordered, including the risks, benefits, and alternatives, and has consented to testing.:   Yes    What type of billing?:   Bill Insurance   Ferritin    Standing Status:   Future    Expected Date:   12/04/2024    Expiration Date:   03/04/2025   Iron and TIBC ([facility] ONLY)    Standing Status:   Future    Expected Date:   12/04/2024    Expiration Date:   03/04/2025   CEA    Standing Status:   Future    Expected Date:   12/04/2024    Expiration Date:   03/04/2025   Comprehensive metabolic panel    Standing Status:   Future    Expected Date:   12/04/2024  Expiration Date:   03/04/2025   CBC with Differential    Standing Status:   Future    Expected Date:   12/04/2024    Expiration Date:   03/04/2025    INTERVAL HISTORY: Patient returns for follow-up for history of cecal cancer and iron deficiency anemia.  Appetite is 100% and energy levels are 40%.  Reports he has gained some weight over the past several months.  Patient has chronic smoker's cough.  He also has chronic bilateral lower extremity swelling and is currently taking Lasix .  Reports overall he is doing well except for excessive weight gain and sleepiness throughout the day.  Reports he can eat a meal and fall asleep 45 minutes later.  Weight is more or less stable.  In the interim, patient was seen by Dr. Gretta with vascular for 62-month follow-up status post repair of right common iliac artery aneurysm with EVAR.  About 3 weeks after he was  discharged he was admitted to the hospital for sepsis related to kidney stone and his blood cultures grew [facility].  He was placed on antibiotics to protect his endograft.  He was scheduled to follow-up in 1 year.  He denies any interval hospitalizations, surgeries or changes to baseline health.  No melena, hematochezia or bright red blood per rectum.  We reviewed cbc, cmp, panel, ferritin  SUMMARY OF HEMATOLOGIC HISTORY: Oncology History Overview Note  Assessment: 1.  Stage II (T3 N0) cecal adenocarcinoma: - Colonoscopy (09/08/2022): Cecal mass - CT CAP (09/13/2022): Large cecal mass, small pericolonic and ileocolic nodes.  Multiple bilateral pulmonary nodules measuring up to 7 mm. - CEA (10/05/2022): 42.1 - Robotic assisted laparoscopic right hemicolectomy on 10/07/2022 by Dr. Mavis. - Pathology: Invasive moderately differentiated adenocarcinoma, margins negative, 0/18 lymph nodes involved, pT3 pN0, MMR preserved. - Signatera test in April 2025: Negative. - Colonoscopy (04/10/2023): Bleeding internal hemorrhoids.  Diverticulosis in sigmoid colon.  Single solitary ulcer at the colonic anastomosis, biopsied and benign.   2.  Social/family history: - Lives at home with his wife and is independent of ADLs and IADLs.  He is now working at a [employer].  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  Current active smoker, 1 pack/day, started at age 51. - 2-3 maternal aunts had cancer.  He does not know the type.      No problem history exists.     CBC    Component Value Date/Time   WBC 7.2 05/28/2024 0852   RBC 4.84 05/28/2024 0852   HGB 16.0 05/28/2024 0852   HGB 13.7 10/06/2023 1154   HCT 49.9 05/28/2024 0852   HCT 42.6 10/06/2023 1154   PLT 192 05/28/2024 0852   PLT 216 10/06/2023 1154   MCV 103.1 (H) 05/28/2024 0852   MCV 100 (H) 10/06/2023 1154   MCH 33.1 05/28/2024 0852   MCHC 32.1 05/28/2024 0852   RDW 13.6 05/28/2024 0852   RDW 12.5 10/06/2023 1154    LYMPHSABS 1.5 05/28/2024 0852   LYMPHSABS 1.6 10/06/2023 1154   MONOABS 0.6 05/28/2024 0852   EOSABS 0.3 05/28/2024 0852   EOSABS 0.2 10/06/2023 1154   BASOSABS 0.1 05/28/2024 0852   BASOSABS 0.1 10/06/2023 1154       Latest Ref Rng & Units 05/28/2024    8:52 AM 02/20/2024    2:04 PM 11/21/2023   12:58 PM  CMP  Glucose 70 - 99 mg/dL 889  896  91   BUN 8 - 23 mg/dL 15  14  16   Creatinine 0.61 - 1.24 mg/dL 8.78  8.75  8.77   Sodium 135 - 145 mmol/L 141  139  137   Potassium 3.5 - 5.1 mmol/L 4.8  4.2  4.1   Chloride 98 - 111 mmol/L 103  103  102   CO2 22 - 32 mmol/L 26  26  27    Calcium  8.9 - 10.3 mg/dL 9.1  9.2  9.4   Total Protein 6.5 - 8.1 g/dL 7.4  7.3  7.1   Total Bilirubin 0.0 - 1.2 mg/dL 0.4  0.6  0.5   Alkaline Phos 38 - 126 U/L 102  97  76   AST 15 - 41 U/L 25  27  23    ALT 0 - 44 U/L 24  23  26       Lab Results  Component Value Date   FERRITIN 80 05/28/2024   VITAMINB12 326 10/01/2023    There were no vitals filed for this visit.   Review of System:  Review of Systems  Constitutional:  Positive for malaise/fatigue.  Respiratory:  Positive for cough and shortness of breath.   Cardiovascular:  Positive for leg swelling.  Neurological:  Positive for weakness.    Physical Exam: Physical Exam Constitutional:      Appearance: Normal appearance. He is obese.  HENT:     Head: Normocephalic and atraumatic.  Eyes:     Pupils: Pupils are equal, round, and reactive to [place].  Cardiovascular:     Rate and Rhythm: Normal rate and regular rhythm.     Heart sounds: Normal heart sounds. No murmur heard. Pulmonary:     Effort: Pulmonary effort is normal.     Breath sounds: Normal breath sounds. No wheezing.  Abdominal:     General: Bowel sounds are normal. There is no distension.     Palpations: Abdomen is soft.     Tenderness: There is no abdominal tenderness.  Musculoskeletal:        General: Normal range of motion.     Cervical back: Normal range of motion.   Skin:    General: Skin is warm and dry.     Findings: No rash.  Neurological:     Mental Status: He is alert and oriented to person, place, and time.     Gait: Gait is intact.  Psychiatric:        Mood and Affect: Mood and affect normal.        Cognition and Memory: Memory normal.        Judgment: Judgment normal.      I spent 20 minutes dedicated to the care of this patient (face-to-face and non-face-to-face) on the date of the encounter to include what is described in the assessment and plan.,  Delon Hope, NP 06/06/2024 7:45 AM "

## 2024-06-06 NOTE — Assessment & Plan Note (Addendum)
" -   He denies any change in bowel habits.  Denies bleeding per rectum or melena. - Reviewed labs from 05/28/2024 show normal LFTs and creatinine.  CBC grossly normal.  CEA 2.4.  -Signatera testing was negative.  Repeat in 6 months. -Most recent CTAP on 05/28/2024 shows no findings of active malignancy in the abdomen or pelvis.  Postsurgical changes of partial right Heema colectomy.  Systemic arthrosclerosis.  Mild sigmoid diverticulosis and lumbar spondylosis and degenerative disc disease. - Recommend follow-up in 3 months with repeat labs and in 6 months with labs, CT scan and Signatera testing.   "

## 2024-09-05 ENCOUNTER — Inpatient Hospital Stay

## 2024-11-28 ENCOUNTER — Inpatient Hospital Stay

## 2024-11-28 ENCOUNTER — Other Ambulatory Visit (HOSPITAL_COMMUNITY)

## 2024-12-05 ENCOUNTER — Inpatient Hospital Stay: Admitting: Oncology
